# Patient Record
Sex: Female | Born: 1935 | Race: White | Hispanic: No | Marital: Single | Smoking: Never smoker
Health system: Southern US, Community
[De-identification: ages and names within clinical notes are randomized; demographics above are authoritative.]

## PROBLEM LIST (undated history)

## (undated) DIAGNOSIS — E119 Type 2 diabetes mellitus without complications: Secondary | ICD-10-CM

## (undated) DIAGNOSIS — G309 Alzheimer's disease, unspecified: Secondary | ICD-10-CM

## (undated) DIAGNOSIS — M8580 Other specified disorders of bone density and structure, unspecified site: Secondary | ICD-10-CM

## (undated) DIAGNOSIS — R9389 Abnormal findings on diagnostic imaging of other specified body structures: Secondary | ICD-10-CM

## (undated) DIAGNOSIS — E785 Hyperlipidemia, unspecified: Secondary | ICD-10-CM

## (undated) DIAGNOSIS — C44721 Squamous cell carcinoma of skin of unspecified lower limb, including hip: Secondary | ICD-10-CM

## (undated) DIAGNOSIS — F32A Depression, unspecified: Secondary | ICD-10-CM

## (undated) DIAGNOSIS — K219 Gastro-esophageal reflux disease without esophagitis: Secondary | ICD-10-CM

## (undated) DIAGNOSIS — R413 Other amnesia: Secondary | ICD-10-CM

## (undated) DIAGNOSIS — I499 Cardiac arrhythmia, unspecified: Secondary | ICD-10-CM

## (undated) DIAGNOSIS — F0280 Dementia in other diseases classified elsewhere without behavioral disturbance: Secondary | ICD-10-CM

## (undated) DIAGNOSIS — F329 Major depressive disorder, single episode, unspecified: Secondary | ICD-10-CM

## (undated) HISTORY — DX: Other specified disorders of bone density and structure, unspecified site: M85.80

## (undated) HISTORY — DX: Major depressive disorder, single episode, unspecified: F32.9

## (undated) HISTORY — DX: Hyperlipidemia, unspecified: E78.5

## (undated) HISTORY — DX: Depression, unspecified: F32.A

## (undated) HISTORY — DX: Other amnesia: R41.3

## (undated) HISTORY — PX: TOTAL HIP ARTHROPLASTY: SHX124

## (undated) HISTORY — DX: Type 2 diabetes mellitus without complications: E11.9

## (undated) HISTORY — DX: Abnormal findings on diagnostic imaging of other specified body structures: R93.89

---

## 1970-12-19 HISTORY — PX: VAGINAL HYSTERECTOMY: SUR661

## 1997-09-18 HISTORY — PX: OTHER SURGICAL HISTORY: SHX169

## 1999-11-12 ENCOUNTER — Encounter: Admission: RE | Admit: 1999-11-12 | Discharge: 1999-11-12 | Payer: Self-pay | Admitting: Family Medicine

## 1999-11-12 ENCOUNTER — Encounter: Payer: Self-pay | Admitting: Family Medicine

## 2000-10-19 HISTORY — PX: OTHER SURGICAL HISTORY: SHX169

## 2000-11-14 ENCOUNTER — Encounter: Admission: RE | Admit: 2000-11-14 | Discharge: 2000-11-14 | Payer: Self-pay | Admitting: Family Medicine

## 2000-11-14 ENCOUNTER — Encounter: Payer: Self-pay | Admitting: Family Medicine

## 2001-02-16 HISTORY — PX: BLADDER SUSPENSION: SHX72

## 2001-03-05 ENCOUNTER — Encounter: Payer: Self-pay | Admitting: Urology

## 2001-03-09 ENCOUNTER — Ambulatory Visit (HOSPITAL_COMMUNITY): Admission: RE | Admit: 2001-03-09 | Discharge: 2001-03-09 | Payer: Self-pay | Admitting: Urology

## 2001-10-19 HISTORY — PX: SQUAMOUS CELL CARCINOMA EXCISION: SHX2433

## 2001-10-19 LAB — HM DEXA SCAN

## 2001-11-16 ENCOUNTER — Encounter: Admission: RE | Admit: 2001-11-16 | Discharge: 2001-11-16 | Payer: Self-pay | Admitting: Family Medicine

## 2001-11-16 ENCOUNTER — Encounter: Payer: Self-pay | Admitting: Family Medicine

## 2002-04-04 ENCOUNTER — Encounter: Payer: Self-pay | Admitting: Emergency Medicine

## 2002-04-05 ENCOUNTER — Inpatient Hospital Stay (HOSPITAL_COMMUNITY): Admission: EM | Admit: 2002-04-05 | Discharge: 2002-04-09 | Payer: Self-pay | Admitting: Emergency Medicine

## 2002-04-26 ENCOUNTER — Encounter: Admission: RE | Admit: 2002-04-26 | Discharge: 2002-04-26 | Payer: Self-pay | Admitting: Specialist

## 2002-04-26 ENCOUNTER — Encounter: Payer: Self-pay | Admitting: Specialist

## 2002-07-19 HISTORY — PX: PELVIC FRACTURE SURGERY: SHX119

## 2002-11-04 ENCOUNTER — Encounter: Admission: RE | Admit: 2002-11-04 | Discharge: 2002-11-04 | Payer: Self-pay | Admitting: Family Medicine

## 2002-11-04 ENCOUNTER — Encounter: Payer: Self-pay | Admitting: Family Medicine

## 2003-03-05 ENCOUNTER — Other Ambulatory Visit: Admission: RE | Admit: 2003-03-05 | Discharge: 2003-03-05 | Payer: Self-pay | Admitting: Family Medicine

## 2003-03-05 ENCOUNTER — Encounter: Payer: Self-pay | Admitting: Family Medicine

## 2003-04-18 ENCOUNTER — Encounter: Payer: Self-pay | Admitting: Urology

## 2003-04-18 ENCOUNTER — Encounter: Admission: RE | Admit: 2003-04-18 | Discharge: 2003-04-18 | Payer: Self-pay | Admitting: Urology

## 2003-04-22 ENCOUNTER — Ambulatory Visit (HOSPITAL_BASED_OUTPATIENT_CLINIC_OR_DEPARTMENT_OTHER): Admission: RE | Admit: 2003-04-22 | Discharge: 2003-04-22 | Payer: Self-pay | Admitting: Urology

## 2003-05-07 ENCOUNTER — Encounter: Payer: Self-pay | Admitting: Urology

## 2003-05-07 ENCOUNTER — Encounter: Admission: RE | Admit: 2003-05-07 | Discharge: 2003-05-07 | Payer: Self-pay | Admitting: Urology

## 2003-11-18 ENCOUNTER — Encounter: Admission: RE | Admit: 2003-11-18 | Discharge: 2003-11-18 | Payer: Self-pay | Admitting: Family Medicine

## 2004-10-14 ENCOUNTER — Ambulatory Visit: Payer: Self-pay | Admitting: Family Medicine

## 2004-10-20 ENCOUNTER — Ambulatory Visit: Payer: Self-pay | Admitting: Family Medicine

## 2004-11-02 ENCOUNTER — Encounter: Admission: RE | Admit: 2004-11-02 | Discharge: 2004-11-24 | Payer: Self-pay | Admitting: Family Medicine

## 2004-12-01 ENCOUNTER — Ambulatory Visit: Payer: Self-pay | Admitting: Family Medicine

## 2005-01-13 ENCOUNTER — Encounter: Admission: RE | Admit: 2005-01-13 | Discharge: 2005-01-13 | Payer: Self-pay | Admitting: Family Medicine

## 2005-02-09 ENCOUNTER — Ambulatory Visit: Payer: Self-pay | Admitting: Family Medicine

## 2005-02-16 ENCOUNTER — Ambulatory Visit: Payer: Self-pay | Admitting: Family Medicine

## 2005-02-17 ENCOUNTER — Encounter: Admission: RE | Admit: 2005-02-17 | Discharge: 2005-05-18 | Payer: Self-pay | Admitting: Family Medicine

## 2005-02-23 ENCOUNTER — Inpatient Hospital Stay (HOSPITAL_COMMUNITY): Admission: RE | Admit: 2005-02-23 | Discharge: 2005-02-26 | Payer: Self-pay | Admitting: Specialist

## 2005-02-26 ENCOUNTER — Ambulatory Visit: Payer: Self-pay | Admitting: Physical Medicine & Rehabilitation

## 2005-02-26 ENCOUNTER — Inpatient Hospital Stay
Admission: RE | Admit: 2005-02-26 | Discharge: 2005-03-04 | Payer: Self-pay | Admitting: Physical Medicine & Rehabilitation

## 2005-03-30 ENCOUNTER — Ambulatory Visit: Payer: Self-pay | Admitting: Family Medicine

## 2005-06-30 ENCOUNTER — Ambulatory Visit: Payer: Self-pay | Admitting: Family Medicine

## 2005-10-17 ENCOUNTER — Ambulatory Visit: Payer: Self-pay | Admitting: Family Medicine

## 2006-02-23 ENCOUNTER — Encounter: Admission: RE | Admit: 2006-02-23 | Discharge: 2006-02-23 | Payer: Self-pay | Admitting: Family Medicine

## 2006-11-14 ENCOUNTER — Ambulatory Visit: Payer: Self-pay | Admitting: Family Medicine

## 2006-11-14 LAB — CONVERTED CEMR LAB: Hgb A1c MFr Bld: 6.6 %

## 2006-12-01 ENCOUNTER — Ambulatory Visit: Payer: Self-pay | Admitting: Family Medicine

## 2007-04-20 ENCOUNTER — Encounter: Admission: RE | Admit: 2007-04-20 | Discharge: 2007-04-20 | Payer: Self-pay | Admitting: Family Medicine

## 2007-07-13 ENCOUNTER — Encounter: Payer: Self-pay | Admitting: Family Medicine

## 2007-07-13 DIAGNOSIS — N3941 Urge incontinence: Secondary | ICD-10-CM

## 2007-07-13 DIAGNOSIS — E785 Hyperlipidemia, unspecified: Secondary | ICD-10-CM | POA: Insufficient documentation

## 2007-07-13 DIAGNOSIS — N318 Other neuromuscular dysfunction of bladder: Secondary | ICD-10-CM | POA: Insufficient documentation

## 2007-07-13 DIAGNOSIS — M1990 Unspecified osteoarthritis, unspecified site: Secondary | ICD-10-CM | POA: Insufficient documentation

## 2007-07-13 DIAGNOSIS — E119 Type 2 diabetes mellitus without complications: Secondary | ICD-10-CM

## 2007-07-16 ENCOUNTER — Encounter: Payer: Self-pay | Admitting: Family Medicine

## 2007-07-16 ENCOUNTER — Ambulatory Visit: Payer: Self-pay | Admitting: Family Medicine

## 2007-07-16 DIAGNOSIS — K219 Gastro-esophageal reflux disease without esophagitis: Secondary | ICD-10-CM | POA: Insufficient documentation

## 2007-07-31 ENCOUNTER — Ambulatory Visit: Payer: Self-pay | Admitting: Family Medicine

## 2007-08-14 ENCOUNTER — Ambulatory Visit: Payer: Self-pay | Admitting: Family Medicine

## 2007-08-16 ENCOUNTER — Encounter: Payer: Self-pay | Admitting: Family Medicine

## 2007-08-23 ENCOUNTER — Ambulatory Visit: Payer: Self-pay | Admitting: Family Medicine

## 2007-09-19 DIAGNOSIS — R9389 Abnormal findings on diagnostic imaging of other specified body structures: Secondary | ICD-10-CM

## 2007-09-19 HISTORY — DX: Abnormal findings on diagnostic imaging of other specified body structures: R93.89

## 2007-09-24 ENCOUNTER — Ambulatory Visit: Payer: Self-pay | Admitting: Family Medicine

## 2007-09-24 DIAGNOSIS — M810 Age-related osteoporosis without current pathological fracture: Secondary | ICD-10-CM

## 2007-09-25 ENCOUNTER — Telehealth: Payer: Self-pay | Admitting: Family Medicine

## 2007-10-01 ENCOUNTER — Ambulatory Visit: Payer: Self-pay | Admitting: Internal Medicine

## 2007-12-24 ENCOUNTER — Ambulatory Visit: Payer: Self-pay | Admitting: Family Medicine

## 2007-12-28 LAB — CONVERTED CEMR LAB
ALT: 17 units/L (ref 0–35)
AST: 25 units/L (ref 0–37)
BUN: 21 mg/dL (ref 6–23)
Calcium: 9.8 mg/dL (ref 8.4–10.5)
Chloride: 107 meq/L (ref 96–112)
GFR calc non Af Amer: 73 mL/min
LDL Cholesterol: 84 mg/dL (ref 0–99)
Sodium: 139 meq/L (ref 135–145)
VLDL: 18 mg/dL (ref 0–40)

## 2008-02-01 ENCOUNTER — Encounter: Payer: Self-pay | Admitting: Family Medicine

## 2008-02-08 ENCOUNTER — Encounter (INDEPENDENT_AMBULATORY_CARE_PROVIDER_SITE_OTHER): Payer: Self-pay | Admitting: *Deleted

## 2008-02-17 DIAGNOSIS — R413 Other amnesia: Secondary | ICD-10-CM

## 2008-02-17 HISTORY — DX: Other amnesia: R41.3

## 2008-03-14 ENCOUNTER — Ambulatory Visit: Payer: Self-pay | Admitting: Family Medicine

## 2008-03-14 DIAGNOSIS — R413 Other amnesia: Secondary | ICD-10-CM | POA: Insufficient documentation

## 2008-03-19 LAB — CONVERTED CEMR LAB
Basophils Absolute: 0 10*3/uL (ref 0.0–0.1)
Basophils Relative: 0.5 % (ref 0.0–1.0)
Monocytes Absolute: 0.7 10*3/uL (ref 0.1–1.0)
Neutro Abs: 6 10*3/uL (ref 1.4–7.7)
Platelets: 231 10*3/uL (ref 150–400)
Vitamin B-12: 678 pg/mL (ref 211–911)
WBC: 9.1 10*3/uL (ref 4.5–10.5)

## 2008-03-22 ENCOUNTER — Ambulatory Visit: Payer: Self-pay | Admitting: Family Medicine

## 2008-04-02 ENCOUNTER — Telehealth: Payer: Self-pay | Admitting: Family Medicine

## 2008-05-07 ENCOUNTER — Encounter: Admission: RE | Admit: 2008-05-07 | Discharge: 2008-05-07 | Payer: Self-pay | Admitting: Family Medicine

## 2008-05-09 ENCOUNTER — Encounter (INDEPENDENT_AMBULATORY_CARE_PROVIDER_SITE_OTHER): Payer: Self-pay | Admitting: *Deleted

## 2008-06-23 ENCOUNTER — Ambulatory Visit: Payer: Self-pay | Admitting: Family Medicine

## 2008-06-24 LAB — CONVERTED CEMR LAB
ALT: 18 units/L (ref 0–35)
Bilirubin, Direct: 0.1 mg/dL (ref 0.0–0.3)
Calcium: 9.4 mg/dL (ref 8.4–10.5)
Cholesterol: 129 mg/dL (ref 0–200)
GFR calc Af Amer: 77 mL/min
Glucose, Bld: 78 mg/dL (ref 70–99)
HDL: 48.1 mg/dL (ref >39.0)
Hgb A1c MFr Bld: 6.8 % — ABNORMAL HIGH (ref 4.6–6.0)
LDL Cholesterol: 65 mg/dL (ref 0–99)
Phosphorus: 3.2 mg/dL (ref 2.3–4.6)
Potassium: 4.5 meq/L (ref 3.5–5.1)
Sodium: 141 meq/L (ref 135–145)
Total Bilirubin: 0.7 mg/dL (ref 0.3–1.2)
Total Protein: 6.3 g/dL (ref 6.0–8.3)
VLDL: 16 mg/dL (ref 0–40)

## 2008-09-05 ENCOUNTER — Telehealth (INDEPENDENT_AMBULATORY_CARE_PROVIDER_SITE_OTHER): Payer: Self-pay | Admitting: *Deleted

## 2008-09-18 ENCOUNTER — Telehealth: Payer: Self-pay | Admitting: Family Medicine

## 2008-09-18 ENCOUNTER — Encounter: Payer: Self-pay | Admitting: Family Medicine

## 2008-11-06 ENCOUNTER — Encounter: Payer: Self-pay | Admitting: Family Medicine

## 2008-12-19 HISTORY — PX: CATARACT EXTRACTION, BILATERAL: SHX1313

## 2008-12-22 ENCOUNTER — Ambulatory Visit: Payer: Self-pay | Admitting: Family Medicine

## 2008-12-24 LAB — CONVERTED CEMR LAB
AST: 26 units/L (ref 0–37)
Albumin: 3.6 g/dL (ref 3.5–5.2)
Chloride: 106 meq/L (ref 96–112)
Creatinine, Ser: 1 mg/dL (ref 0.4–1.2)
Creatinine,U: 243.4 mg/dL
Hgb A1c MFr Bld: 6.8 % — ABNORMAL HIGH (ref 4.6–6.0)
LDL Cholesterol: 84 mg/dL (ref 0–99)
Microalb Creat Ratio: 2.9 mg/g (ref 0.0–30.0)
Microalb, Ur: 0.7 mg/dL (ref 0.0–1.9)
Potassium: 4.4 meq/L (ref 3.5–5.1)
Sodium: 142 meq/L (ref 135–145)
VLDL: 17 mg/dL (ref 0–40)

## 2008-12-25 ENCOUNTER — Ambulatory Visit: Payer: Self-pay | Admitting: Family Medicine

## 2008-12-25 ENCOUNTER — Telehealth: Payer: Self-pay | Admitting: Family Medicine

## 2009-05-06 ENCOUNTER — Encounter: Payer: Self-pay | Admitting: Family Medicine

## 2009-05-08 ENCOUNTER — Encounter: Admission: RE | Admit: 2009-05-08 | Discharge: 2009-05-08 | Payer: Self-pay | Admitting: Family Medicine

## 2009-05-12 ENCOUNTER — Encounter (INDEPENDENT_AMBULATORY_CARE_PROVIDER_SITE_OTHER): Payer: Self-pay | Admitting: *Deleted

## 2009-06-23 ENCOUNTER — Ambulatory Visit: Payer: Self-pay | Admitting: Family Medicine

## 2009-06-24 LAB — CONVERTED CEMR LAB
ALT: 13 units/L (ref 0–35)
AST: 25 units/L (ref 0–37)
Chloride: 105 meq/L (ref 96–112)
Creatinine, Ser: 0.9 mg/dL (ref 0.4–1.2)
Glucose, Bld: 102 mg/dL — ABNORMAL HIGH (ref 70–99)
Hgb A1c MFr Bld: 6.7 % — ABNORMAL HIGH (ref 4.6–6.5)
Phosphorus: 3.8 mg/dL (ref 2.3–4.6)
Potassium: 4.2 meq/L (ref 3.5–5.1)
Sodium: 142 meq/L (ref 135–145)
VLDL: 17.4 mg/dL (ref 0.0–40.0)

## 2009-06-26 ENCOUNTER — Ambulatory Visit: Payer: Self-pay | Admitting: Family Medicine

## 2009-09-23 ENCOUNTER — Encounter: Payer: Self-pay | Admitting: Family Medicine

## 2009-09-24 ENCOUNTER — Ambulatory Visit: Payer: Self-pay | Admitting: Family Medicine

## 2009-10-15 ENCOUNTER — Encounter: Payer: Self-pay | Admitting: Family Medicine

## 2009-10-15 ENCOUNTER — Telehealth: Payer: Self-pay | Admitting: Family Medicine

## 2009-12-22 ENCOUNTER — Ambulatory Visit: Payer: Self-pay | Admitting: Family Medicine

## 2009-12-22 DIAGNOSIS — I10 Essential (primary) hypertension: Secondary | ICD-10-CM | POA: Insufficient documentation

## 2009-12-23 LAB — CONVERTED CEMR LAB
ALT: 16 units/L (ref 0–35)
AST: 24 units/L (ref 0–37)
Albumin: 3.5 g/dL (ref 3.5–5.2)
BUN: 12 mg/dL (ref 6–23)
Chloride: 104 meq/L (ref 96–112)
Cholesterol: 165 mg/dL (ref 0–200)
Glucose, Bld: 107 mg/dL — ABNORMAL HIGH (ref 70–99)
Phosphorus: 4.2 mg/dL (ref 2.3–4.6)
Potassium: 4.8 meq/L (ref 3.5–5.1)
Sodium: 141 meq/L (ref 135–145)

## 2010-01-07 ENCOUNTER — Ambulatory Visit: Payer: Self-pay | Admitting: Family Medicine

## 2010-01-08 ENCOUNTER — Telehealth: Payer: Self-pay | Admitting: Family Medicine

## 2010-02-16 ENCOUNTER — Ambulatory Visit: Payer: Self-pay | Admitting: Family Medicine

## 2010-02-17 LAB — CONVERTED CEMR LAB
ALT: 13 units/L (ref 0–35)
AST: 19 units/L (ref 0–37)
HDL: 57.7 mg/dL (ref >39.00)

## 2010-03-09 ENCOUNTER — Encounter: Payer: Self-pay | Admitting: Family Medicine

## 2010-04-05 ENCOUNTER — Ambulatory Visit: Payer: Self-pay | Admitting: Internal Medicine

## 2010-05-10 ENCOUNTER — Encounter: Admission: RE | Admit: 2010-05-10 | Discharge: 2010-05-10 | Payer: Self-pay | Admitting: Family Medicine

## 2010-05-14 ENCOUNTER — Encounter (INDEPENDENT_AMBULATORY_CARE_PROVIDER_SITE_OTHER): Payer: Self-pay | Admitting: *Deleted

## 2010-07-08 ENCOUNTER — Ambulatory Visit: Payer: Self-pay | Admitting: Family Medicine

## 2010-07-08 LAB — CONVERTED CEMR LAB
ALT: 14 units/L (ref 0–35)
Cholesterol: 145 mg/dL (ref 0–200)
Creatinine,U: 257.9 mg/dL
GFR calc non Af Amer: 66.52 mL/min (ref >60)
HDL: 54.1 mg/dL (ref >39.00)
Microalb Creat Ratio: 0.6 mg/g (ref 0.0–30.0)
Phosphorus: 3.8 mg/dL (ref 2.3–4.6)
Potassium: 4.5 meq/L (ref 3.5–5.1)
VLDL: 16 mg/dL (ref 0.0–40.0)

## 2010-07-09 LAB — CONVERTED CEMR LAB: Vit D, 25-Hydroxy: 47 ng/mL (ref 30–89)

## 2010-07-12 ENCOUNTER — Ambulatory Visit: Payer: Self-pay | Admitting: Family Medicine

## 2010-09-16 ENCOUNTER — Ambulatory Visit: Payer: Self-pay | Admitting: Family Medicine

## 2010-10-12 ENCOUNTER — Ambulatory Visit: Payer: Self-pay | Admitting: Family Medicine

## 2010-10-12 LAB — CONVERTED CEMR LAB
CO2: 22 meq/L (ref 19–32)
Calcium: 8.6 mg/dL (ref 8.4–10.5)
Chloride: 108 meq/L (ref 96–112)
GFR calc non Af Amer: 61.5 mL/min (ref >60)
Potassium: 4.2 meq/L (ref 3.5–5.1)
Sodium: 139 meq/L (ref 135–145)

## 2010-10-14 ENCOUNTER — Encounter: Payer: Self-pay | Admitting: Family Medicine

## 2010-10-19 ENCOUNTER — Ambulatory Visit: Payer: Self-pay | Admitting: Family Medicine

## 2010-11-16 ENCOUNTER — Telehealth: Payer: Self-pay | Admitting: Family Medicine

## 2010-11-18 ENCOUNTER — Encounter: Payer: Self-pay | Admitting: Family Medicine

## 2010-12-23 ENCOUNTER — Encounter: Payer: Self-pay | Admitting: Family Medicine

## 2010-12-30 ENCOUNTER — Encounter (INDEPENDENT_AMBULATORY_CARE_PROVIDER_SITE_OTHER): Payer: Self-pay | Admitting: *Deleted

## 2011-01-20 ENCOUNTER — Other Ambulatory Visit (INDEPENDENT_AMBULATORY_CARE_PROVIDER_SITE_OTHER): Payer: Medicare Other

## 2011-01-20 ENCOUNTER — Ambulatory Visit: Admit: 2011-01-20 | Payer: Self-pay | Admitting: Family Medicine

## 2011-01-20 ENCOUNTER — Other Ambulatory Visit: Payer: Self-pay | Admitting: Family Medicine

## 2011-01-20 ENCOUNTER — Encounter (INDEPENDENT_AMBULATORY_CARE_PROVIDER_SITE_OTHER): Payer: Self-pay | Admitting: *Deleted

## 2011-01-20 DIAGNOSIS — E785 Hyperlipidemia, unspecified: Secondary | ICD-10-CM

## 2011-01-20 DIAGNOSIS — E119 Type 2 diabetes mellitus without complications: Secondary | ICD-10-CM

## 2011-01-20 DIAGNOSIS — I10 Essential (primary) hypertension: Secondary | ICD-10-CM

## 2011-01-20 LAB — LIPID PANEL
HDL: 52.1 mg/dL (ref >39.00)
LDL Cholesterol: 78 mg/dL (ref 0–99)
Total CHOL/HDL Ratio: 3
Triglycerides: 72 mg/dL (ref 0.0–149.0)

## 2011-01-20 LAB — RENAL FUNCTION PANEL
Albumin: 3.7 g/dL (ref 3.5–5.2)
CO2: 25 mEq/L (ref 19–32)
Calcium: 9 mg/dL (ref 8.4–10.5)
Chloride: 101 mEq/L (ref 96–112)
Phosphorus: 3.8 mg/dL (ref 2.3–4.6)
Potassium: 4.3 mEq/L (ref 3.5–5.1)

## 2011-01-20 LAB — HEMOGLOBIN A1C: Hgb A1c MFr Bld: 6.8 % — ABNORMAL HIGH (ref 4.6–6.5)

## 2011-01-20 LAB — ALT: ALT: 11 U/L (ref 0–35)

## 2011-01-20 LAB — AST: AST: 18 U/L (ref 0–37)

## 2011-01-20 NOTE — Assessment & Plan Note (Signed)
Summary: flu shot/dlo   Nurse Visit   Allergies: 1)  ! Fosamax 2)  ! Actonel  Immunizations Administered:  Influenza Vaccine # 1:    Vaccine Type: Fluvax MCR    Site: left deltoid    Mfr: GlaxoSmithKline    Dose: 0.5 ml    Route: IM    Given by: Mervin Hack CMA (AAMA)    Exp. Date: 06/18/2011    Lot #: [id number]    VIS given: 07/13/10 version given September 17, 2010.  Flu Vaccine Consent Questions:    Do you have a history of severe allergic reactions to this vaccine? no    Any prior history of allergic reactions to egg and/or gelatin? no    Do you have a sensitivity to the preservative Thimersol? no    Do you have a past history of Guillan-Barre Syndrome? no    Do you currently have an acute febrile illness? no    Have you ever had a severe reaction to latex? no    Vaccine information given and explained to patient? yes    Are you currently pregnant? no  Orders Added: 1)  Flu Vaccine 60yrs + MEDICARE PATIENTS [Q2039] 2)  Administration Flu vaccine - MCR [G0008]

## 2011-01-20 NOTE — Miscellaneous (Signed)
Summary: med list update- test strips,lancets  Medications Added ONETOUCH ULTRA BLUE  STRP (GLUCOSE BLOOD) check blood sugar once a day and as directed LANCETS  MISC (LANCETS) check blood sugar once a day and as directed       Clinical Lists Changes  Medications: Changed medication from * GLUCOSE TEST STRIPS AND LANCETS to check sugar once daily and as needed for DM 250.0 to ONETOUCH ULTRA BLUE  STRP (GLUCOSE BLOOD) check blood sugar once a day and as directed Added new medication of LANCETS  MISC (LANCETS) check blood sugar once a day and as directed     Prior Medications: DETROL LA 4 MG  CP24 (TOLTERODINE TARTRATE) one by mouth daily LIPITOR 10 MG  TABS (ATORVASTATIN CALCIUM) 1 by mouth once daily in evening ECOTRIN 325 MG  TBEC (ASPIRIN) take one by mouth daily MIACALCIN 200 UNIT/ACT NASAL SOLN (CALCITONIN (SALMON)) 1 spray in one nostril once daily (alternate nostrils) ARICEPT 10 MG  TABS (DONEPEZIL HCL) take 1 tablet by mouth once daily PROTONIX 40 MG  TBEC (PANTOPRAZOLE SODIUM) 1 by mouth once daily in am CALTRATE 600+D 600-400 MG-UNIT TABS (CALCIUM CARBONATE-VITAMIN D) take 1 by mouth two times a day METFORMIN HCL 500 MG TABS (METFORMIN HCL) 1 by mouth two times a day GLUCOMETER () to check sugar once daily and as needed for DM 250.0 ONETOUCH ULTRA BLUE  STRP (GLUCOSE BLOOD) check blood sugar once a day and as directed LANCETS  MISC (LANCETS) check blood sugar once a day and as directed Current Allergies: ! FOSAMAX ! ACTONEL

## 2011-01-20 NOTE — Progress Notes (Signed)
Summary: prior Berkley Harvey is needed for pantoprazole  Phone Note From Pharmacy   Caller: [facility] Summary of Call: Prior Berkley Harvey is needed for pantoprazole, form is on your shelf.  This is a renewal. Initial call taken by: Lowella Petties CMA, AAMA,  November 16, 2010 11:25 AM  Follow-up for Phone Call        form done and in nurse in box   Follow-up by: Judith Part MD,  November 17, 2010 1:29 PM  Additional Follow-up for Phone Call Additional follow up Details #1::        Form faxed.              Lowella Petties CMA, AAMA  November 18, 2010 11:51 AM  Prior Berkley Harvey given for pantoprazole.  Advised pharmacy, approval letter placed on doctors shelf for signature and scanning. Additional Follow-up by: Lowella Petties CMA, AAMA,  November 22, 2010 8:55 AM

## 2011-01-20 NOTE — Letter (Signed)
Summary: Results Follow up Letter  [place] [facility]  [address] [place], [place] [address]   Phone: [phone]  Fax: [phone]    05/14/2010 MRN: [id number]    EVELENE ROUSSIN [address] [place], [place]  [address]    Dear Ms. Moodie,  The following are the results of your recent test(s):  Test         Result    Pap Smear:        Normal _____  Not Normal _____ Comments: ______________________________________________________ Cholesterol: LDL(Bad cholesterol):         Your goal is less than:         HDL (Good cholesterol):       Your goal is more than: Comments:  ______________________________________________________ Mammogram:        Normal __X___  Not Normal _____ Comments:  Yearly follow up is recommended.   ___________________________________________________________________ Hemoccult:        Normal _____  Not normal _______ Comments:    _____________________________________________________________________ Other Tests:    We routinely do not discuss normal results over the telephone.  If you desire a copy of the results, or you have any questions about this information we can discuss them at your next office visit.   Sincerely,    Marne A. Milinda Antis, M.D.  MAT:lsf

## 2011-01-20 NOTE — Assessment & Plan Note (Signed)
Summary: 6 MO. F/U/BIR   Vital Signs:  Patient profile:   77 year old female Weight:      134 pounds Temp:     97.3 degrees F oral Pulse rate:   68 / minute Pulse rhythm:   regular BP sitting:   130 / 60  (left arm) Cuff size:   regular  Vitals Entered By: Lowella Petties CMA (January 07, 2010 11:44 AM) CC: 6 month follow up   History of Present Illness: here for follow up of DM and HTN and lipids  is doing ok - nothing new  feeling ok   wt is up 3 lb is eating a fairly healthy diet -- mostly eats in  not much walking - is limited with a cane   DM- AIC is 6.8- fairly stable  is good watching sugar in diet  opthy is up to date  had both cataracts removed- went very well -- vision is really good -- last exam dec / goes to [facility] opthy Dr Burgess Estelle did surgery   lipids are well controlled with trig 98/ HDL 54 and LDL of 91 LDL is not to goal of 70-- pt is open to inc dose   seeing neurol for mild cognitive impairment she really struggles with that -- is about the same/ short term mem loss is frustrating , however  still does her own finances / sister helps her with that  sees [name]- will f/u in 10 mo  has osteopenia on miacalcin nasal spray  last dexa 2/09-- wants to go ahead and set up in spring  on ca and D  bp is good 130/60- no problems or changes with that   constant post nasal drip -- ? if antihist would help     Allergies: 1)  ! Fosamax 2)  ! Actonel  Past History:  Past Medical History: Last updated: 12/25/2008 Diabetes mellitus, type II Hyperlipidemia CT chest, 5mm L nodule- needs f/u CT in one year from 10/08 short term memory loss 3/09- (mild cognitive impairment)   neurology--Dr Love opthy- Dr Nelle Don  Family History: Last updated: 12/25/2008 Father: CVA Mother: CVA, dementia Siblings: 5 sisters- 2 breast cancer, 2 brothers- 1 prostate cancer older sister with alzheimers  sister- mild memory loss  sister with breast cancer (and  neices)  Social History: Last updated: 12/25/2008 Marital Status: single lives with her sister- who helps care for her (and another sister) Children: none Occupation: retired Engineer, site involved in church activities   Risk Factors: Smoking Status: never (07/13/2007)  Past Surgical History: 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 Eyes:  Denies blurring and eye irritation. CV:  Denies chest pain or discomfort, lightheadness, palpitations, and shortness of breath with exertion. Resp:  Denies cough and wheezing. GI:  Denies abdominal pain, change in bowel habits, and indigestion. MS:  Complains of joint pain and stiffness; denies muscle aches. Derm:  Denies itching, lesion(s), poor wound healing, and rash. Neuro:  Denies numbness and tingling. Psych:  Denies anxiety and depression. Endo:  Denies cold intolerance, excessive thirst, excessive urination, and heat intolerance. Heme:  Denies abnormal bruising and bleeding.  Physical Exam  General:  elderly frail app female in no distress- breathing easily Head:  normocephalic, atraumatic, and no abnormalities observed.   Eyes:  vision grossly intact, pupils equal, pupils round, and pupils reactive to light.   Neck:  supple with full rom and no masses or thyromegally, no JVD or carotid bruit  Lungs:  Normal respiratory effort, chest expands symmetrically. Lungs are clear to auscultation, no crackles or wheezes. Heart:  Normal rate and regular rhythm. S1 and S2 normal without gallop,  murmur, click, rub or other extra sounds. Abdomen:  soft and non-tender.   Msk:  mild kyphosis  OA changes in periph joints Pulses:  R and L carotid,radial,femoral,dorsalis pedis and posterior tibial pulses are full and equal bilaterally Neurologic:  sensation intact to light touch, gait normal, and DTRs symmetrical and normal.   Skin:  overgrown toenail L 2nd toe- is impinging on the 1st toe  thickened nails  no rash  Cervical Nodes:  No lymphadenopathy noted Inguinal Nodes:  No significant adenopathy Psych:  normal affect, talkative and pleasant   Diabetes Management Exam:    Foot Exam (with socks and/or shoes not present):       Sensory-Pinprick/Light touch:          Left medial foot (L-4): normal          Left dorsal foot (L-5): normal          Left lateral foot (S-1): normal          Right medial foot (L-4): normal          Right dorsal foot (L-5): normal          Right lateral foot (S-1): normal       Sensory-Monofilament:          Left foot: normal          Right foot: normal       Inspection:          Left foot: normal          Right foot: normal       Nails:          Left foot: thickened          Right foot: thickened    Eye Exam:       Eye Exam done elsewhere          Date: 11/18/2009          Results: normal          Done by: Manley Mason opthy   Impression & Recommendations:  Problem # 1:  ESSENTIAL HYPERTENSION, BENIGN (ICD-401.1) Assessment Unchanged control is good currently - no changes  rev low sodium in diet  enc to be more active  BP today: 130/60 Prior BP: 130/60 (06/26/2009)  Labs Reviewed: K+: 4.8 (12/22/2009) Creat: : 0.8 (12/22/2009)   Chol: 165 (12/22/2009)   HDL: 54.60 (12/22/2009)   LDL: 91 (12/22/2009)   TG: 98.0 (12/22/2009)  Problem # 2:  OSTEOPOROSIS NOS (ICD-733.00) Assessment: Unchanged due for dexa in spring consider trial of evista enc to continue miacalcin and ca and D  will check D level with lab in 6 mo  Her updated medication  list for this problem includes:    Miacalcin 200 Unit/act Nasal Soln (Calcitonin (salmon)) .Marland Kitchen... 1 spray in one nostril once daily (alternate nostrils)  Orders: Radiology Referral (Radiology)  Problem # 3:  MEMORY LOSS (ICD-780.93) Assessment: Comment Only overall stable mild cog  impairment- followed by [name] wil continue the aricept  Problem # 4:  HYPERLIPIDEMIA (ICD-272.4) Assessment: Unchanged  lipids not quite at goal- will inc to 10 mg daily lipitor rev low sat fat diet  labs in 6 wk and update f/u 6 mo  Her updated medication list for this problem includes:    Lipitor 10 Mg Tabs (Atorvastatin calcium) .Marland Kitchen... 1 by mouth once daily in evening  Orders: Prescription Created Electronically [phone])  Labs Reviewed: SGOT: 24 (12/22/2009)   SGPT: 16 (12/22/2009)   HDL:54.60 (12/22/2009), 52.30 (06/23/2009)  LDL:91 (12/22/2009), 84 (06/23/2009)  Chol:165 (12/22/2009), 154 (06/23/2009)  Trig:98.0 (12/22/2009), 87.0 (06/23/2009)  Problem # 5:  DIABETES MELLITUS, TYPE II (ICD-250.00) Assessment: Unchanged  overall stable with diet control  opthy utd  ref to pod for diff to cut nails  disc healthy diet (low simple sugar/ choose complex carbs/ low sat fat) diet and exercise in detail  lab and f/u 6 mo  Her updated medication list for this problem includes:    Ecotrin 325 Mg Tbec (Aspirin) .Marland Kitchen... Take one by mouth daily  Orders: Podiatry Referral (Podiatry)  Labs Reviewed: Creat: 0.8 (12/22/2009)     Last Eye Exam: normal (04/18/2009) Reviewed HgBA1c results: 6.8 (12/22/2009)  6.7 (06/23/2009)  Complete Medication List: 1)  Detrol La 4 Mg Cp24 (Tolterodine tartrate) .... One by mouth daily 2)  Lipitor 10 Mg Tabs (Atorvastatin calcium) .Marland Kitchen.. 1 by mouth once daily in evening 3)  Ecotrin 325 Mg Tbec (Aspirin) .... Take one by mouth daily 4)  Miacalcin 200 Unit/act Nasal Soln (Calcitonin (salmon)) .Marland Kitchen.. 1 spray in one nostril once daily (alternate nostrils) 5)  Caltrate 600+d  Plus 600-400 Mg-unit  .Marland KitchenMarland Kitchen. 1 by mouth two times a day 6)  Aricept 10 Mg Tabs (Donepezil hcl) .... Take 1 tablet by mouth once daily 7)  Protonix 40 Mg Tbec (Pantoprazole sodium) .Marland Kitchen.. 1 by mouth once daily in am  Patient Instructions: 1)  we will refer you to podiatry at check out  2)  keep working on healthy diet and exercise 3)  try claritin one daily for post nasal drip  4)  we will schedule bone density test at check out  5)  increase lipitor to one whole pill daily  6)  schedule fasting labs in 6 weeks lipid/ast/alt272 7)  then schedule fasting lab and f/u in 6 mo lipid/ast/alt/AIC/renal / microalb/ vit D level 272, 250.0, 401.1  Prescriptions: LIPITOR 10 MG  TABS (ATORVASTATIN CALCIUM) 1 by mouth once daily in evening  #30 x 11   Entered and Authorized by:   Judith Part MD   Signed by:   Judith Part MD on 01/07/2010   Method used:   Electronically to        [facility]* (retail)       [address]       [place], [place]  [address]       Ph: [phone]       Fax: [phone]   RxID:   [id number]   Prior Medications (reviewed today): DETROL LA 4 MG  CP24 (TOLTERODINE TARTRATE) one by mouth daily ECOTRIN 325 MG  TBEC (ASPIRIN) take one by mouth daily MIACALCIN 200 UNIT/ACT NASAL SOLN (CALCITONIN (SALMON)) 1 spray in one nostril once daily (alternate nostrils) CALTRATE 600+D PLUS 600-400 MG-UNIT () 1 by mouth two times a day ARICEPT 10 MG  TABS (DONEPEZIL HCL) take 1 tablet by mouth once daily PROTONIX 40 MG  TBEC (PANTOPRAZOLE SODIUM) 1 by  mouth once daily in am Current Allergies: ! FOSAMAX ! ACTONEL

## 2011-01-20 NOTE — Letter (Signed)
Summary: [facility]  [facility]   Imported By: Maryln Gottron 10/20/2010 13:48:43  _____________________________________________________________________  External Attachment:    Type:   Image     Comment:   External Document

## 2011-01-20 NOTE — Letter (Signed)
Summary: [facility]   Imported By: Lanelle Bal 01/03/2011 07:41:13  _____________________________________________________________________  External Attachment:    Type:   Image     Comment:   External Document  Appended Document: [facility]     Clinical Lists Changes  Observations: Added new observation of DMEYEEXAMNXT: 12/2011 (01/05/2011 21:16) Added new observation of DMEYEEXMRES: normal (12/23/2010 21:16) Added new observation of EYE EXAM BY: Dr Burgess Estelle (12/23/2010 21:16) Added new observation of DIAB EYE EX: normal (12/23/2010 21:16)        Diabetes Management Exam:    Eye Exam:       Eye Exam done elsewhere          Date: 12/23/2010          Results: normal          Done by: Dr Burgess Estelle

## 2011-01-20 NOTE — Medication Information (Signed)
Summary: Prior Authorization and Approval for Pantoprazole Sodium  Prior Authorization and Approval for Pantoprazole Sodium   Imported By: Maryln Gottron 11/29/2010 15:42:42  _____________________________________________________________________  External Attachment:    Type:   Image     Comment:   External Document

## 2011-01-20 NOTE — Assessment & Plan Note (Signed)
Summary: follow up   Vital Signs:  Patient profile:   77 year old female Height:      61 inches Weight:      135.25 pounds BMI:     25.65 Temp:     97.8 degrees F oral Pulse rate:   76 / minute Pulse rhythm:   regular BP sitting:   132 / 64  (left arm) Cuff size:   regular  Vitals Entered By: Lewanda Rife LPN (July 12, 2010 9:05 AM) CC: follow-up visit   History of Present Illness: here for f/u of DM and lipids and HTN and osteopenia  is feeling ok overall -nothing new   wt is not changed  bp is 132/64 on first check today  DM is slt worse with AIC 7.0 is eating about the same as usual -- perhaps portions could be smaller is close to a diabetic diet  not much exercise - due to hot weather  thinks she can do better  this is diet controlled  vit D level is good on current suppl at 47- fairly stable  lipids are well controlled with trig 80 and HDL 54 and LDL 75 (up from 66)  memory problems -- MCI -- short term memory is not good  she still does most everything incl writing checks  not affecting her activities   some post nasal drip  is on gerd reflux med      Allergies: 1)  ! Fosamax 2)  ! Actonel  Past History:  Past Medical History: Last updated: 03/29/2010 Diabetes mellitus, type II Hyperlipidemia CT chest, 5mm L nodule- needs f/u CT in one year from 10/08 short term memory loss 3/09- (mild cognitive impairment) osteopenia    neurology--Dr Love opthy- Dr Nelle Don  Past Surgical History: Last updated: 01/07/2010 Total hip replacement- right (02/2005) Hysterectomy Ischemic colitis- colonoscopy (09/1997) Dexa- osteopenia (10/2000)     improved (10/2001) Squamous cell carcinoma on leg (10/1999) Pelvic vaginal sling for incont (02/2001) Pelvic fracture after a fall (07/2002) Colonoscopy- diverticulosis (1998,  03/2003) CT lung nodule 10/08- re check 1 yr 2010- bilat cataracts removed   Family History: Last updated: 12/25/2008 Father:  CVA Mother: CVA, dementia Siblings: 5 sisters- 2 breast cancer, 2 brothers- 1 prostate cancer older sister with alzheimers  sister- mild memory loss  sister with breast cancer (and neices)  Social History: Last updated: 12/25/2008 Marital Status: single lives with her sister- who helps care for her (and another sister) Children: none Occupation: retired Engineer, site involved in church activities   Risk Factors: Smoking Status: never (07/13/2007)  Review of Systems General:  Denies fatigue, loss of appetite, and malaise. Eyes:  Denies blurring and eye irritation. ENT:  Complains of postnasal drainage; denies sinus pressure and sore throat; some allergy drainage - clears her throat and spits into throat . CV:  Denies chest pain or discomfort, palpitations, and shortness of breath with exertion. Resp:  Denies cough, shortness of breath, sputum productive, and wheezing. GI:  Denies abdominal pain, change in bowel habits, and nausea. MS:  Denies muscle aches, cramps, and muscle weakness. Derm:  Denies itching, poor wound healing, and rash. Neuro:  Denies numbness and tingling. Psych:  Denies anxiety and depression. Endo:  Denies cold intolerance, excessive thirst, excessive urination, and heat intolerance. Heme:  Denies abnormal bruising and bleeding.  Physical Exam  General:  elderly female/ overwt in no distress Head:  normocephalic, atraumatic, and no abnormalities observed.   Eyes:  vision grossly intact, pupils equal, pupils  round, and pupils reactive to light.  no conjunctival pallor, injection or icterus  Mouth:  pharynx pink and moist.   Neck:  supple with full rom and no masses or thyromegally, no JVD or carotid bruit  Chest Wall:  No deformities, masses, or tenderness noted. Lungs:  normal respiratory effort, no intercostal retractions, no accessory muscle use, normal breath sounds, no crackles, and no wheezes.   Heart:  Normal rate and regular rhythm. S1 and S2 normal  without gallop, murmur, click, rub or other extra sounds. Abdomen:  Bowel sounds positive,abdomen soft and non-tender without masses, organomegaly or hernias noted. no renal bruits  Msk:  No deformity or scoliosis noted of thoracic or lumbar spine.  some baseline kyphosis  Pulses:  R and L carotid,radial,femoral,dorsalis pedis and posterior tibial pulses are full and equal bilaterally Extremities:  No clubbing, cyanosis, edema, or deformity noted with normal full range of motion of all joints.   Neurologic:  sensation intact to light touch, gait normal, and DTRs symmetrical and normal.   Skin:  Intact without suspicious lesions or rashes Cervical Nodes:  No lymphadenopathy noted Inguinal Nodes:  No significant adenopathy Psych:  normal affect, talkative and pleasant   Diabetes Management Exam:    Foot Exam (with socks and/or shoes not present):       Sensory-Pinprick/Light touch:          Left medial foot (L-4): normal          Left dorsal foot (L-5): normal          Left lateral foot (S-1): normal          Right medial foot (L-4): normal          Right dorsal foot (L-5): normal          Right lateral foot (S-1): normal       Sensory-Monofilament:          Left foot: normal          Right foot: normal       Inspection:          Left foot: normal          Right foot: normal       Nails:          Left foot: normal          Right foot: normal   Impression & Recommendations:  Problem # 1:  ESSENTIAL HYPERTENSION, BENIGN (ICD-401.1) Assessment Unchanged  this is stable and fairly controlled for her age will continue to monitor and watch sodium in diet   BP today: 132/64 Prior BP: 130/70 (04/05/2010)  Labs Reviewed: K+: 4.5 (07/08/2010) Creat: : 0.9 (07/08/2010)   Chol: 145 (07/08/2010)   HDL: 54.10 (07/08/2010)   LDL: 75 (07/08/2010)   TG: 80.0 (07/08/2010)  Orders: Prescription Created Electronically [id number])  Problem # 2:  OSTEOPOROSIS NOS (ICD-733.00) Assessment:  Unchanged  rev nl vit D level- will continue current meds disc safety in the home Her updated medication list for this problem includes:    Miacalcin 200 Unit/act Nasal Soln (Calcitonin (salmon)) .Marland Kitchen... 1 spray in one nostril once daily (alternate nostrils)    Caltrate 600+d 600-400 Mg-unit Tabs (Calcium carbonate-vitamin d) .Marland Kitchen... Take 1 by mouth two times a day  Orders: Prescription Created Electronically [phone])  Problem # 3:  HYPERLIPIDEMIA (ICD-272.4) Assessment: Unchanged  with slt inc in LDL  disc low sat fat diet  control is still fairly good Her updated medication list for this problem  includes:    Lipitor 10 Mg Tabs (Atorvastatin calcium) .Marland Kitchen... 1 by mouth once daily in evening  Labs Reviewed: SGOT: 21 (07/08/2010)   SGPT: 14 (07/08/2010)   HDL:54.10 (07/08/2010), 57.70 (02/16/2010)  LDL:75 (07/08/2010), 66 (02/16/2010)  Chol:145 (07/08/2010), 145 (02/16/2010)  Trig:80.0 (07/08/2010), 105.0 (02/16/2010)  Orders: Prescription Created Electronically [phone])  Problem # 4:  DIABETES MELLITUS, TYPE II (ICD-250.00) Assessment: Deteriorated  this is a bit worse on diet control pt wants to work on diet and exercise before trying any med disc low simple sugar and limited carb diet disc starting 20 min of walking in house daily- until cool enough to go outside  lab and f/u 3 mo  rev labs in detail today Her updated medication list for this problem includes:    Ecotrin 325 Mg Tbec (Aspirin) .Marland Kitchen... Take one by mouth daily  Orders: Prescription Created Electronically [phone])  Complete Medication List: 1)  Detrol La 4 Mg Cp24 (Tolterodine tartrate) .... One by mouth daily 2)  Lipitor 10 Mg Tabs (Atorvastatin calcium) .Marland Kitchen.. 1 by mouth once daily in evening 3)  Ecotrin 325 Mg Tbec (Aspirin) .... Take one by mouth daily 4)  Miacalcin 200 Unit/act Nasal Soln (Calcitonin (salmon)) .Marland Kitchen.. 1 spray in one nostril once daily (alternate nostrils) 5)  Aricept 10 Mg Tabs (Donepezil hcl) ....  Take 1 tablet by mouth once daily 6)  Protonix 40 Mg Tbec (Pantoprazole sodium) .Marland Kitchen.. 1 by mouth once daily in am 7)  Caltrate 600+d 600-400 Mg-unit Tabs (Calcium carbonate-vitamin d) .... Take 1 by mouth two times a day  Patient Instructions: 1)  watch carbohydrates and sweets in diet as much as possible  2)  perhaps smaller portions  3)  aim for 20 minutes of walking daily in the house  4)  check non fasting labs in 3 months AIC and renal and then follow up  5)  no changes in medicines  Prescriptions: PROTONIX 40 MG  TBEC (PANTOPRAZOLE SODIUM) 1 by mouth once daily in am  #30 x 11   Entered and Authorized by:   Judith Part MD   Signed by:   Judith Part MD on 07/12/2010   Method used:   Electronically to        [facility]* (retail)       [address]       [place], [place]  [address]       Ph: [phone]       Fax: [phone]   RxID:   [id number] DETROL LA 4 MG  CP24 (TOLTERODINE TARTRATE) one by mouth daily  #30 x 11   Entered and Authorized by:   Judith Part MD   Signed by:   Judith Part MD on 07/12/2010   Method used:   Electronically to        [facility]* (retail)       [address]       [place], [place]  [address]       Ph: [phone]       Fax: [phone]   RxID:   [id number]   Current Allergies (reviewed today): ! FOSAMAX ! ACTONEL

## 2011-01-20 NOTE — Progress Notes (Signed)
Summary: Rx MIACALCIN 200 UNIT/ACT NASAL SOLN  Phone Note Refill Request Call back at [phone] Message from:  [facility] on January 08, 2010 8:35 AM  Refills Requested: Medication #1:  MIACALCIN 200 UNIT/ACT NASAL SOLN 1 spray in one nostril once daily (alternate nostrils)   Last Refilled: 09/24/2009 Received faxed refill request, please advise   Method Requested: Electronic Initial call taken by: Linde Gillis CMA Duncan Dull),  January 08, 2010 8:36 AM  Follow-up for Phone Call        px written on EMR for call in  Follow-up by: Judith Part MD,  January 08, 2010 9:05 AM  Additional Follow-up for Phone Call Additional follow up Details #1::        Called to [facility]. Additional Follow-up by: Lowella Petties CMA,  January 08, 2010 9:38 AM    Prescriptions: MIACALCIN 200 UNIT/ACT NASAL SOLN (CALCITONIN (SALMON)) 1 spray in one nostril once daily (alternate nostrils)  #1 month x 11   Entered and Authorized by:   Judith Part MD   Signed by:   Lowella Petties CMA on 01/08/2010   Method used:   Telephoned to ...       [facility]* (retail)       [address]       [place], [place]  [address]       Ph: [phone]       Fax: [phone]   RxID:   [id number]

## 2011-01-20 NOTE — Assessment & Plan Note (Signed)
Summary: COUGH/CLE   Vital Signs:  Patient profile:   77 year old female Weight:      135 pounds O2 Sat:      94 % on Room air Temp:     99.9 degrees F tympanic Pulse rate:   90 / minute Pulse rhythm:   regular Resp:     16 per minute BP sitting:   130 / 70  (left arm) Cuff size:   regular  Vitals Entered By: Mervin Hack CMA Duncan Dull) (April 05, 2010 10:07 AM)  O2 Flow:  Room air CC: cough   History of Present Illness: Sister brings her in Has been coughing for 2 days feels a tickle in her throat ?slight sore throat  No fever No night sweats or shakes Mild SOB-- sister notes it more than her (couple of brief episodes of short breath)  No sig mucus No ear pain some nasal congestion with some PND (sister notes constant drainage that is not new)  Allergies: 1)  ! Fosamax 2)  ! Actonel  Past History:  Past medical, surgical, family and social histories (including risk factors) reviewed for relevance to current acute and chronic problems.  Past Medical History: Reviewed history from 03/29/2010 and no changes required. Diabetes mellitus, type II Hyperlipidemia CT chest, 5mm L nodule- needs f/u CT in one year from 10/08 short term memory loss 3/09- (mild cognitive impairment) osteopenia    neurology--Dr Love opthy- Dr Nelle Don  Past Surgical History: Reviewed history from 01/07/2010 and no changes required. Total hip replacement- right (02/2005) Hysterectomy Ischemic colitis- colonoscopy (09/1997) Dexa- osteopenia (10/2000)     improved (10/2001) Squamous cell carcinoma on leg (10/1999) Pelvic vaginal sling for incont (02/2001) Pelvic fracture after a fall (07/2002) Colonoscopy- diverticulosis (1998,  03/2003) CT lung nodule 10/08- re check 1 yr 2010- bilat cataracts removed   Family History: Reviewed history from 12/25/2008 and no changes required. Father: CVA Mother: CVA, dementia Siblings: 5 sisters- 2 breast cancer, 2 brothers- 1 prostate  cancer older sister with alzheimers  sister- mild memory loss  sister with breast cancer (and neices)  Social History: Reviewed history from 12/25/2008 and no changes required. Marital Status: single lives with her sister- who helps care for her (and another sister) Children: none Occupation: retired Engineer, site involved in church activities   Review of Systems       sister notes ongoing memory issues no vomiting or diarrhea appetite is okay  Physical Exam  General:  alert.  NAD Head:  No frontal or maxillary tenderness Ears:  R ear normal and L ear normal.   Nose:  no sig inflammation Mouth:  no erythema and no exudates.   Neck:  supple, no masses, and no cervical lymphadenopathy.   Lungs:  normal respiratory effort, no intercostal retractions, no accessory muscle use, normal breath sounds, no crackles, and no wheezes.     Impression & Recommendations:  Problem # 1:  URI (ICD-465.9) Assessment New seems to be viral infeciton more in chest than head nothing to suggest pneumonia discussed supportive care If worsens, like with purulent sputum, would try empiric antibiotic  Complete Medication List: 1)  Detrol La 4 Mg Cp24 (Tolterodine tartrate) .... One by mouth daily 2)  Lipitor 10 Mg Tabs (Atorvastatin calcium) .Marland Kitchen.. 1 by mouth once daily in evening 3)  Ecotrin 325 Mg Tbec (Aspirin) .... Take one by mouth daily 4)  Miacalcin 200 Unit/act Nasal Soln (Calcitonin (salmon)) .Marland Kitchen.. 1 spray in one nostril once daily (alternate nostrils)  5)  Aricept 10 Mg Tabs (Donepezil hcl) .... Take 1 tablet by mouth once daily 6)  Protonix 40 Mg Tbec (Pantoprazole sodium) .Marland Kitchen.. 1 by mouth once daily in am 7)  Caltrate 600+d 600-400 Mg-unit Tabs (Calcium carbonate-vitamin d) .... Take 1 by mouth two times a day  Patient Instructions: 1)  Please schedule a follow-up appointment as needed .   Current Allergies (reviewed today): ! FOSAMAX ! ACTONEL

## 2011-01-20 NOTE — Assessment & Plan Note (Signed)
Summary: F/U AFTER LABS / LFW   Vital Signs:  Patient profile:   77 year old female Height:      61 inches Weight:      135.25 pounds BMI:     25.65 Temp:     98.1 degrees F oral Pulse rate:   80 / minute Pulse rhythm:   regular BP sitting:   120 / 64  (left arm) Cuff size:   regular  Vitals Entered By: Lewanda Rife LPN (October 19, 2010 9:19 AM) CC: three month f/u after labs   History of Present Illness: here for f/u of DM and HTN   glucose 226 and AIc 7.0 last visit wanted to work on diet  this is unchanged from last time -AIC   eats a plain old country diet  ? did dm classes a long time ago  declines to go   glad she does not eat sweets   wt stable   bp good control 120/64  not very mobile for much exercise  poor short term memory  Allergies: 1)  ! Fosamax 2)  ! Actonel  Past History:  Past Medical History: Last updated: 03/29/2010 Diabetes mellitus, type II Hyperlipidemia CT chest, 5mm L nodule- needs f/u CT in one year from 10/08 short term memory loss 3/09- (mild cognitive impairment) osteopenia    neurology--Dr Love opthy- Dr Nelle Don  Past Surgical History: Last updated: 01/07/2010 Total hip replacement- right (02/2005) Hysterectomy Ischemic colitis- colonoscopy (09/1997) Dexa- osteopenia (10/2000)     improved (10/2001) Squamous cell carcinoma on leg (10/1999) Pelvic vaginal sling for incont (02/2001) Pelvic fracture after a fall (07/2002) Colonoscopy- diverticulosis (1998,  03/2003) CT lung nodule 10/08- re check 1 yr 2010- bilat cataracts removed   Family History: Last updated: 12/25/2008 Father: CVA Mother: CVA, dementia Siblings: 5 sisters- 2 breast cancer, 2 brothers- 1 prostate cancer older sister with alzheimers  sister- mild memory loss  sister with breast cancer (and neices)  Social History: Last updated: 12/25/2008 Marital Status: single lives with her sister- who helps care for her (and another  sister) Children: none Occupation: retired Engineer, site involved in church activities   Risk Factors: Smoking Status: never (07/13/2007)  Review of Systems General:  Denies fatigue, fever, loss of appetite, and malaise. Eyes:  Denies blurring and eye irritation. CV:  Denies chest pain or discomfort, palpitations, shortness of breath with exertion, and swelling of feet. Resp:  Denies cough, shortness of breath, and wheezing. GI:  Denies abdominal pain, change in bowel habits, and indigestion. GU:  Denies urinary frequency. MS:  Denies muscle aches and cramps. Derm:  Denies lesion(s), poor wound healing, and rash. Neuro:  Complains of memory loss; denies numbness and tingling. Psych:  Denies anxiety and depression. Endo:  Denies excessive thirst and excessive urination. Heme:  Denies abnormal bruising and bleeding.  Physical Exam  General:  elderly female/ overwt in no distress Head:  normocephalic, atraumatic, and no abnormalities observed.   Eyes:  vision grossly intact, pupils equal, pupils round, and pupils reactive to light.  no conjunctival pallor, injection or icterus  Mouth:  pharynx pink and moist.   Neck:  supple with full rom and no masses or thyromegally, no JVD or carotid bruit  Lungs:  normal respiratory effort, no intercostal retractions, no accessory muscle use, normal breath sounds, no crackles, and no wheezes.   Heart:  Normal rate and regular rhythm. S1 and S2 normal without gallop, murmur, click, rub or other extra sounds. Abdomen:  Bowel sounds positive,abdomen soft and non-tender without masses, organomegaly or hernias noted. no renal bruits  Pulses:  R and L carotid,radial,femoral,dorsalis pedis and posterior tibial pulses are full and equal bilaterally Extremities:  No clubbing, cyanosis, edema, or deformity noted with normal full range of motion of all joints.   Neurologic:  sensation intact to light touch, gait normal, and DTRs symmetrical and normal.    Skin:  Intact without suspicious lesions or rashes Cervical Nodes:  No lymphadenopathy noted Psych:  normal affect, talkative and pleasant  is somewhat timid and quiet today  Diabetes Management Exam:    Foot Exam (with socks and/or shoes not present):       Sensory-Pinprick/Light touch:          Left medial foot (L-4): normal          Left dorsal foot (L-5): normal          Left lateral foot (S-1): normal          Right medial foot (L-4): normal          Right dorsal foot (L-5): normal          Right lateral foot (S-1): normal       Sensory-Monofilament:          Left foot: normal          Right foot: normal       Inspection:          Left foot: normal          Right foot: normal       Nails:          Left foot: normal          Right foot: normal   Impression & Recommendations:  Problem # 1:  ESSENTIAL HYPERTENSION, BENIGN (ICD-401.1) Assessment Unchanged  this is well controlled without med  will need to disc renal protection  in future, however in light of DM  BP today: 120/64 Prior BP: 132/64 (07/12/2010)  Labs Reviewed: K+: 4.2 (10/12/2010) Creat: : 0.9 (10/12/2010)   Chol: 145 (07/08/2010)   HDL: 54.10 (07/08/2010)   LDL: 75 (07/08/2010)   TG: 80.0 (07/08/2010)  Problem # 2:  DIABETES MELLITUS, TYPE II (ICD-250.00) pt does not have good idea of DM diet but declines classes at this time recommended sugar busters book start metformin and update check sugar once daily lab and f/u 3 mo  Her updated medication list for this problem includes:    Ecotrin 325 Mg Tbec (Aspirin) .Marland Kitchen... Take one by mouth daily    Metformin Hcl 500 Mg Tabs (Metformin hcl) .Marland Kitchen... 1 by mouth two times a day  Complete Medication List: 1)  Detrol La 4 Mg Cp24 (Tolterodine tartrate) .... One by mouth daily 2)  Lipitor 10 Mg Tabs (Atorvastatin calcium) .Marland Kitchen.. 1 by mouth once daily in evening 3)  Ecotrin 325 Mg Tbec (Aspirin) .... Take one by mouth daily 4)  Miacalcin 200 Unit/act Nasal Soln  (Calcitonin (salmon)) .Marland Kitchen.. 1 spray in one nostril once daily (alternate nostrils) 5)  Aricept 10 Mg Tabs (Donepezil hcl) .... Take 1 tablet by mouth once daily 6)  Protonix 40 Mg Tbec (Pantoprazole sodium) .Marland Kitchen.. 1 by mouth once daily in am 7)  Caltrate 600+d 600-400 Mg-unit Tabs (Calcium carbonate-vitamin d) .... Take 1 by mouth two times a day 8)  Metformin Hcl 500 Mg Tabs (Metformin hcl) .Marland Kitchen.. 1 by mouth two times a day 9)  Glucometer  .... To check sugar once  daily and as needed for dm 250.0 10)  Glucose Test Strips and Lancets  .... To check sugar once daily and as needed for dm 250.0  Patient Instructions: 1)  check your sugar on MWF-- in am fasting  2)  on T, TH, sat , sun -- 2 hours after afternoon or evening meal  3)  start metformin two times a day -- update me if any problems  4)  schedule fasting labs in 3 months lipid/ast/alt/ AIC/ renal 250.0 , 272  and 401.1   then follow up  5)  get the book SUGAR BUSTERS at any bookstore for some guidance  6)  try to minimize starches and sweets in diet  Prescriptions: GLUCOSE TEST STRIPS AND LANCETS to check sugar once daily and as needed for DM 250.0  #100 each x 3   Entered and Authorized by:   Judith Part MD   Signed by:   Judith Part MD on 10/19/2010   Method used:   Print then Give to Patient   RxID:   [id number] GLUCOMETER to check sugar once daily and as needed for DM 250.0  #1 x 0   Entered and Authorized by:   Judith Part MD   Signed by:   Judith Part MD on 10/19/2010   Method used:   Print then Give to Patient   RxID:   [id number] METFORMIN HCL 500 MG TABS (METFORMIN HCL) 1 by mouth two times a day  #60 x 11   Entered and Authorized by:   Judith Part MD   Signed by:   Judith Part MD on 10/19/2010   Method used:   Print then Give to Patient   RxID:   [id number]    Orders Added: 1)  Est. Patient Level III [18841]    Current Allergies (reviewed today): ! FOSAMAX ! ACTONEL

## 2011-01-24 ENCOUNTER — Ambulatory Visit (INDEPENDENT_AMBULATORY_CARE_PROVIDER_SITE_OTHER): Payer: Medicare Other | Admitting: Family Medicine

## 2011-01-24 ENCOUNTER — Encounter: Payer: Self-pay | Admitting: Family Medicine

## 2011-01-24 DIAGNOSIS — E119 Type 2 diabetes mellitus without complications: Secondary | ICD-10-CM

## 2011-01-24 DIAGNOSIS — I10 Essential (primary) hypertension: Secondary | ICD-10-CM

## 2011-01-24 DIAGNOSIS — E785 Hyperlipidemia, unspecified: Secondary | ICD-10-CM

## 2011-02-09 NOTE — Assessment & Plan Note (Signed)
Summary: 3 MONTH FOLLOW-UP/[facility]   Nurse Visit   Vital Signs:  Patient profile:   77 year old female Height:      61 inches Weight:      130.25 pounds BMI:     24.70 Temp:     97.4 degrees F oral Pulse rate:   88 / minute Pulse rhythm:   regular BP sitting:   126 / 64  (left arm) Cuff size:   regular  Vitals Entered By: Lewanda Rife LPN (January 24, 2011 2:09 PM)  History of Present Illness: here for f/u of HTN and DM and lipids   feels fine - nothing new   wt is down about 5 lb     HTN is well controlled with 126/64 today- no problems at all with med  [facility] is 6.8 down from 7 on metformin  sugars fasting are great 90s to low 100s her 2h pp are quite variable -- usually 140s to low 200s  sister is going to DM teaching so she can make meals  she does not eat sweets    diet - is about the same  not much exercise   lipids  Last Lipid ProfileCholesterol: 144 (01/20/2011 12:40:16 PM)HDL:  52.10 (01/20/2011 12:40:16 PM)LDL:  78 (01/20/2011 12:40:16 PM)Triglycerides:  Last Liver profileSGOT:  18 (01/20/2011 12:40:16 PM)SPGT:  11 (01/20/2011 12:40:16 PM)T. 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) Hysterectomy Ischemic colitis- colonoscopy (09/1997) Dexa- osteopenia (10/2000)     improved (10/2001) Squamous cell carcinoma on leg (10/1999) Pelvic vaginal sling for incont (02/2001) Pelvic fracture after a fall (07/2002) Colonoscopy- diverticulosis (1998,  03/2003) CT lung nodule 10/08- re check 1 yr 2010- bilat cataracts removed   Family History: Last updated:  12/25/2008 Father: CVA Mother: CVA, dementia Siblings: 5 sisters- 2 breast cancer, 2 brothers- 1 prostate cancer older sister with alzheimers  sister- mild memory loss  sister with breast cancer (and neices)  Social History: Last updated: 12/25/2008 Marital Status: single lives with her sister- who helps care for her (and another sister) Children: none Occupation: retired Engineer, site involved in church activities   Risk Factors: Smoking Status: never (07/13/2007)   Review of Systems General:  Denies fatigue, fever, loss of appetite, and malaise. Eyes:  Denies blurring and eye irritation. CV:  Denies chest pain or discomfort, lightheadness, and palpitations. Resp:  Denies cough, shortness of breath, and wheezing. GI:  Denies abdominal pain, change in bowel habits, indigestion, and nausea. GU:  Denies dysuria and urinary frequency. MS:  Denies muscle aches and cramps. Derm:  Denies itching, lesion(s), poor wound healing, and rash. Neuro:  Denies numbness and tingling. Psych:  Denies anxiety and depression. Endo:  Denies cold intolerance, excessive thirst, excessive urination, and heat intolerance. Heme:  Denies abnormal bruising and bleeding.   Impression & Recommendations:  Problem # 1:  ESSENTIAL HYPERTENSION, BENIGN (ICD-401.1) Assessment Unchanged bp is controlled without med - continue to watch BP today: 126/64 Prior BP: 120/64 (10/19/2010)  Labs Reviewed: K+: 4.3 (01/20/2011) Creat: : 0.7 (01/20/2011)   Chol: 144 (01/20/2011)   HDL: 52.10 (01/20/2011)   LDL: 78 (01/20/2011)  TG: 72.0 (01/20/2011)  Problem # 2:  HYPERLIPIDEMIA (ICD-272.4) Assessment: Unchanged  lipids are in very good control with statin and diet  rev low sat fat diet  f/u 6 mo after labs  Her updated medication list for this problem includes:    Lipitor 10 Mg Tabs (Atorvastatin calcium) .Marland Kitchen... 1 by mouth once daily in evening  Labs Reviewed: SGOT: 18 (01/20/2011)   SGPT: 11  (01/20/2011)   HDL:52.10 (01/20/2011), 54.10 (07/08/2010)  LDL:78 (01/20/2011), 75 (07/08/2010)  Chol:144 (01/20/2011), 145 (07/08/2010)  Trig:72.0 (01/20/2011), 80.0 (07/08/2010)  Problem # 3:  DIABETES MELLITUS, TYPE II (ICD-250.00) Assessment: Improved  this has improved slt with better diet  now enc more exercise- as tolerated  most high sugars are pp  her sister will attend DM teaching and start prep her meals based on dm diet  lab and f/u 6 mo  continue checking sugars once per day - some pp  Her updated medication list for this problem includes:    Ecotrin 325 Mg Tbec (Aspirin) .Marland Kitchen... Take one by mouth daily    Metformin Hcl 500 Mg Tabs (Metformin hcl) .Marland Kitchen... 1 by mouth two times a day  Labs Reviewed: Creat: 0.7 (01/20/2011)     Last Eye Exam: normal (12/23/2010) Reviewed HgBA1c results: 6.8 (01/20/2011)  7.0 (10/12/2010)  Complete Medication List: 1)  Detrol La 4 Mg Cp24 (Tolterodine tartrate) .... One by mouth daily 2)  Lipitor 10 Mg Tabs (Atorvastatin calcium) .Marland Kitchen.. 1 by mouth once daily in evening 3)  Ecotrin 325 Mg Tbec (Aspirin) .... Take one by mouth daily 4)  Miacalcin 200 Unit/act Nasal Soln (Calcitonin (salmon)) .Marland Kitchen.. 1 spray in one nostril once daily (alternate nostrils) 5)  Aricept 10 Mg Tabs (Donepezil hcl) .... Take 1 tablet by mouth once daily 6)  Protonix 40 Mg Tbec (Pantoprazole sodium) .Marland Kitchen.. 1 by mouth once daily in am 7)  Caltrate 600+d 600-400 Mg-unit Tabs (Calcium carbonate-vitamin d) .... Take 1 by mouth two times a day 8)  Metformin Hcl 500 Mg Tabs (Metformin hcl) .Marland Kitchen.. 1 by mouth two times a day 9)  Glucometer  .... To check sugar once daily and as needed for dm 250.0 10)  Onetouch Ultra Blue Strp (Glucose blood) .... Check blood sugar once a day and as directed 11)  Lancets Misc (Lancets) .... Check blood sugar once a day and as directed   Patient Instructions: 1)  start learning more about diabetic diet and planning meals according to that  2)  try  to get as much exercise as you tolerate  3)  no change in medicines  4)  keep checking sugar -- some days in am and some days after a meal - that is fine  5)  no change in medicines 6)  schedule fasting labs and then follow up for check up in 6 months  7)  lipid/ast/alt/ renal/ AIC/ vit D / tsh for 272 and 250.0/ 733.0   Physical Exam  General:  elderly female/ overwt in no distress Head:  normocephalic, atraumatic, and no abnormalities observed.   Eyes:  vision grossly intact, pupils equal, pupils round, and pupils reactive to light.  no conjunctival pallor, injection or icterus  Mouth:  pharynx pink and moist.   Neck:  supple with full rom and no masses or thyromegally, no JVD or carotid bruit  Chest Wall:  No deformities, masses, or tenderness noted. Lungs:  normal respiratory effort, no intercostal retractions, no accessory muscle use, normal breath sounds, no crackles, and no  wheezes.   Heart:  Normal rate and regular rhythm. S1 and S2 normal without gallop, murmur, click, rub or other extra sounds. Abdomen:  no renal bruits  Msk:  No deformity or scoliosis noted of thoracic or lumbar spine.  some baseline kyphosis  Pulses:  R and L carotid,radial,femoral,dorsalis pedis and posterior tibial pulses are full and equal bilaterally Extremities:  No clubbing, cyanosis, edema, or deformity noted with normal full range of motion of all joints.   Neurologic:  sensation intact to light touch and gait normal.   Skin:  Intact without suspicious lesions or rashes Cervical Nodes:  No lymphadenopathy noted Psych:  normal affect, talkative and pleasant  is somewhat timid and quiet today  Diabetes Management Exam:    Foot Exam (with socks and/or shoes not present):       Sensory-Pinprick/Light touch:          Left medial foot (L-4): normal          Left dorsal foot (L-5): normal          Left lateral foot (S-1): normal          Right medial foot (L-4): normal          Right dorsal foot (L-5):  normal          Right lateral foot (S-1): normal       Sensory-Monofilament:          Left foot: normal          Right foot: normal       Inspection:          Left foot: normal          Right foot: normal       Nails:          Left foot: normal          Right foot: normal  CC: three  month f/u   Allergies: 1)  ! Fosamax 2)  ! Actonel  Orders Added: 1)  Est. Patient Level IV [04540]  Current Allergies (reviewed today): ! FOSAMAX ! ACTONEL

## 2011-02-18 ENCOUNTER — Other Ambulatory Visit: Payer: Self-pay | Admitting: Family Medicine

## 2011-02-18 DIAGNOSIS — Z1231 Encounter for screening mammogram for malignant neoplasm of breast: Secondary | ICD-10-CM

## 2011-04-11 ENCOUNTER — Encounter: Payer: Self-pay | Admitting: Family Medicine

## 2011-04-11 ENCOUNTER — Ambulatory Visit (INDEPENDENT_AMBULATORY_CARE_PROVIDER_SITE_OTHER): Payer: Medicare Other | Admitting: Family Medicine

## 2011-04-11 VITALS — BP 114/62 | HR 78 | Temp 97.6°F | Ht 65.0 in | Wt 124.1 lb

## 2011-04-11 DIAGNOSIS — J40 Bronchitis, not specified as acute or chronic: Secondary | ICD-10-CM

## 2011-04-11 LAB — HM DIABETES EYE EXAM

## 2011-04-11 LAB — HM MAMMOGRAPHY

## 2011-04-11 MED ORDER — AZITHROMYCIN 250 MG PO TABS: ORAL_TABLET | ORAL | Status: DC

## 2011-04-11 MED ORDER — AZITHROMYCIN 250 MG PO TABS: ORAL_TABLET | ORAL | Status: AC

## 2011-04-11 NOTE — Progress Notes (Signed)
SUBJECTIVE:  [name] is a 77 y.o. female who complains of congestion, sneezing, sore throat and productive cough for 7 days. She denies a history of chest pain, chills, dizziness, fatigue, fevers, myalgias, nausea, shortness of breath and weakness and denies a history of asthma. Patient denies smoke cigarettes.   The PMH, PSH, Social History, Family History, Medications, and allergies have been reviewed in [facility], and have been updated if relevant.  OBJECTIVE: BP 114/62  Pulse 78  Temp(Src) 97.6 F (36.4 C) (Oral)  Ht 5\' 5"  (1.651 m)  Wt 124 lb 1.9 oz (56.3 kg)  BMI 20.65 kg/m2  She appears well, vital signs are as noted. Ears normal.  Throat and pharynx normal.  Neck supple. No adenopathy in the neck. Nose is congested. Sinuses non tender.  Lungs:  Scattered exp wheezes on left, no increased WOB or crackles. Right side clear.  ASSESSMENT:  viral upper respiratory illness and bronchitis  PLAN: Symptomatic therapy suggested: push fluids, rest and return office visit prn if symptoms persist or worsen.  Will also give rx for Zpack given her lung exam and functional status (could quickly develop PNA).

## 2011-04-11 NOTE — Progress Notes (Signed)
Addended by: Linde Gillis on: 04/11/2011 12:22 PM   Modules accepted: Orders

## 2011-04-19 ENCOUNTER — Telehealth: Payer: Self-pay | Admitting: *Deleted

## 2011-04-19 NOTE — Telephone Encounter (Signed)
Spoke with [name] and advised her as instructed via telephone.

## 2011-04-19 NOTE — Telephone Encounter (Signed)
Patient finished her zpak on Friday. She is feeling better other than still having some rattling in her chest. [name] is asking if she should do another round of antibiotic. Uses [facility].

## 2011-04-19 NOTE — Telephone Encounter (Signed)
Spoke with patient via telephone and could tell she did not understand what I was explaining to her.  Asked her to have [name] give me a call here at the office.

## 2011-04-19 NOTE — Telephone Encounter (Signed)
No abx should still be in her system. Continue supportive care.

## 2011-05-06 NOTE — Op Note (Signed)
[name], [name] NO.:  [id number]   MEDICAL RECORD NO.:  [id number]          PATIENT TYPE:  INP   LOCATION:  0004                         FACILITY:  [facility]   PHYSICIAN:  Kerrin Champagne, M.D.   DATE OF BIRTH:  1922/11/21   DATE OF PROCEDURE:  02/23/2005  DATE OF DISCHARGE:                                 OPERATIVE REPORT   PREOPERATIVE DIAGNOSES:  Right total hip arthroplasty with liner worn,  hairline greater trochanter fracture with osteolysis occurring about the  greater trochanter.   POSTOPERATIVE DIAGNOSES:  Right hip replacement, liner worn, with greater  trochanter fracture secondary to debris wear.   PROCEDURE:  1.  Revision, right total hip arthroplasty liner, using a 10 degree      Osteonics OmniFit cup insert, 32 mm socket.  The patient had replacement      of the femoral head, Morse taper head to a 32 mm head.  2.  Debridement of wear debris from the right total hip arthroplasty and      open reduction/internal fixation of right greater trochanter fracture      using two Vitallium alloy wires, 2.0 mm in diameter, that were beaded,      and tension banding the greater trochanter into position.  She underwent      allograft bone graft to the greater trochanter fracture site.   SURGEON:  Kerrin Champagne, M.D.   ASSISTANT:  [name], PA-C.   ANESTHESIA:  General via orotracheal intubation, Dr. Rica Mast.   SPECIMENS:  Cultures, anaerobic and aerobic, right hip, were sent.  Gram's  stain inconclusive regarding any organisms seen.   ESTIMATED BLOOD LOSS:  200 cc.   COMPLICATIONS:  None.  Patient returned to the PICU in good condition.   CLINICAL HISTORY:  The patient is an 77 year old female who had fallen  several years ago and was seen in regards to her hip.  Had a pubic ramus  fracture at that time.  She has had surgeries to her left hip for a left hip  open reduction/internal fixation.  She has been experiencing pain into her  right  thigh with difficulty standing and walking.  She has had a history of  previous right hip surgery by Dr. [name], total hip arthroplasty.  She was  doing well up until about two weeks ago, apparently with increasing pain  when she rolls onto the right side.  Previously seen in the office, where  radiographs demonstrated significant polyethylene wear within the acetabular  liner with the head of the prosthesis nearly subluxed superiorly due to  thinning of the liner.  The patient on radiographs preoperatively also  demonstrates a hairline fracture of the greater trochanter.  This places her  at some risk of undergoing further fracture during the procedure.   INTRAOPERATIVE FINDINGS:  The patient was found to have a significant amount  of wear debris within the right hip total hip arthroplasty with reactive  changes.  This was debrided as best as possible.  She underwent a hip  capsulotomy anteriorly, superiorly, and posteriorly, and a revision  of the  femoral component head to a +5 32 mm head.  The acetabular shell was removed  without much difficulty and replaced with an OmniFit 32 mm liner.  As this  was a 50 mm shell, it was felt that this liner could be placed without much  difficulty.  We are concerned about accelerated wear.   DESCRIPTION OF PROCEDURE:  After adequate general anesthesia, the patient  was placed in the left lateral decubitus position, right side up.  A Mark IV  frame was used to hold the patient in place.  All pressure points well-  padded.  Standard preoperative antibiotics with Ancef.  She underwent a  Duraprep prep to the right lower extremity from the ankle circumferential  about the leg to the upper thigh, about the right buttock to the right lower  ribs.  She was draped in the usual manner.  Iodine Vidrape was used.  Prior  to placement of the Vidrape, the skin incision was marked out.  In fact,  this was done even prior to prep to insure localization of the  previous  incision scar.  Following draping then, incision was made, lifting the old  incision scar over the mid portion of the scar, and the incision laid about  12-14 cm in length through the skin and subcutaneous layers, down to the  tensor fascia.  The incision was made in the tensor fascia in the old  incision scar, removing the old Ethibond suture that had been retained.  Incision was then carried sharply through the tensor fascia lata to the  greater trochanter.  The greater trochanteric bursa was resected.   The incision carried distally in the insertion of the fascia into the  posterior aspect of the femur.  It was incised over its proximal one-third.  A Charnley retractor inserted, retracting the fascia lata and tensor muscle  medial, both anterior and posterior.  The short external rotators were then  carefully incised off of the posterior aspect of the proximal femur and then  carried along the gluteus minimus muscle superiorly, using this to make a  large flap over the posterior capsule, exposing the posterior hip joint.  Ethibond sutures were placed into this for retraction purposes.  The  posterior aspect of the acetabular shell was then carefully debrided in  order to visualize this area.  Distally, as well, the distal portions of the  shell were then carefully exposed, removing debris and scar tissue as  appropriate.  A bone hook was used for retraction over the proximal femur,  and the anterior capsule was then excised.  The head and neck Morse taper  was then carefully broken, using the vice grip-type instrument that was  placed over the head and neck and used to carefully disimpact the head and  neck.  Additional tapping of the [facility] taper was necessary to complete the  disengagement and to allow for removal of the head so that further exposure  of the acetabulum could be obtained.  Further portions of the anterior capsule and superior aspect of the capsule were then  able to be resected,  and the entire circumferential area of the acetabular shell was able to be  exposed.  A Colby retractor placed over the anterior aspect of the patient's  pelvis and another placed within the obturator foramen.   With this, then a drill hole was placed into the superior medial aspect of  the patient's liner and then a large fragment, fully threaded screw  was able  to be screwed into place, loosening the liner and allowing for its  extraction from the acetabular shell.  With this completed, further exposure  was obtained circumferentially about the shell of the acetabulum.  Central  poles of the acetabular implant noted to have significant wear debris also  present; however, the shell itself appeared to be well fixed to the  acetabulum and immobile.  The femur, with the removal of the [facility] taper  head and neck, there was displacement of the greater trochanter fracture  that was already fractured preoperatively.  This was fixed with tension band  wires at the end of the case.  After completely exposing the acetabular  metallic shell circumferentially within the acetabulum, a permanent 10  degree OmniFit 32 mm hip liner was then able to be inserted after careful  irrigation of this area and removal of any debris from the metallic shell.  The liner was then carefully positioned with a 10 degree lip posteriorly  positioned.  This was then impacted into place and the shell seated nicely  and Therapist, nutritional used to carefully examine the circumference of the liner  acetabular shell interface, indicating there were no areas of loosening or  __________ tissue.  With this, then the 32 mm head was then carefully  brought onto the field.  A 2 mm Vitallium mesh wire was then passed through  drill holes of the proximal femur just distal to the transverse fracture  site by about 2.5 cm.  Cautery used to divide the vastus lateralis here and  then drill hole placed, and the Vitallium  wire then placed through these  areas.  These were brought up over the top of the greater trochanter tip  proximally and then over the lateral aspect of the trochanteric fracture  region.  The portion of Gelfoam was then placed over the neck of the  prosthesis, and then bone graft was then able to be placed.  The Vitallium  wire was then placed through their sleeves and then tightened with the hip  and abduction until the trochanteric fracture was then reduced and the  sleeve primped with the printing on the sleeve downward.  The portion of  wire that remained was then clipped close to the sleeve to allow for the  wire to be shortened appropriately.  With this then, care was taken to  additionally bone graft the greater trochanter using a portion of Gelfoam to  hold it against the medial aspect of the trochanter and to prevent it from  becoming a portion of the joint or falling into the joint with motion. Irrigation was performed, and the Hemovac drain was placed within the depth  of the incision.  The posterior hip capsule and short external rotators then  approximated to the proximal posterior aspect of the femur with drill holes  using #1 Ethibond sutures.  Also, then approximated to the greater  trochanter proximally and posteriorly as well as the gluteus minimus  superiorly, basically providing coverage over the posterior aspect of the  hip and decreasing dead space here.  With this completed, irrigation again  performed and the tensor fascia lata was then reapproximated with  interrupted #1 Vicryl sutures.  The superficial fascia overlying the tensor  muscle proximally with interrupted #1 Vicryl sutures.  Deep subcu layers  approximated with interrupted #1 and 0 Vicryl sutures.  The more superficial  layers with interrupted 2-0 Vicryl sutures.  The skin closed with a running  subcu  stitch of 4-0 Vicryl.  Tincture of Benzoin and Steri-Strips was  applied, and 4x4s and ABD pads affixed  to the skin with Hypafix tape.  The  patient was then returned to a supine position, a knee immobilizer placed,  and the patient was then reactivated, extubated, and returned to the  recovery room in satisfactory condition.  All instrument and sponge counts  were correct.      JEN/MEDQ  D:  02/23/2005  T:  02/23/2005  Job:  [id number]

## 2011-05-06 NOTE — Discharge Summary (Signed)
[facility]  Patient:    [name], [name] Visit Number: [id number] MRN: [id number]          Service Type: MED Location: [id number] Attending Physician:  Lubertha South Dictated by:   Alexzandrew L. Perkins, P.A.-C. Admit Date:  04/04/2002 Disc. Date: 04/09/02                             Discharge Summary  ADMITTING DIAGNOSES: 1. Probable right pubic ramus fracture. 2. History of incontinence. 3. Arthritis. 4. Erosion degeneration of right acetabulum, as per CT.  DISCHARGE DIAGNOSES: 1. Probable right pubic ramus fracture. 2. History of incontinence. 3. Arthritis. 4. Erosion degeneration of right acetabulum, as per CT. 5. Urinary tract infection, enterococcus.  PROCEDURES:  None.  CONSULTATIONS:  None.  BRIEF HISTORY:  The patient is a 77 year old female who has had previous right total hip by Dr. [name] in 1988.  The patient unfortunately had a fall in the [employer] parking lot on the date of admission.  This occurred over at [facility].  She did have right hip pain with weightbearing.  She was brought to [facility], where x-rays revealed probable pubic ramus fracture.  She was seen and evaluated by Dr. Vira Browns and admitted for pain control.  LABORATORY DATA:  A urinalysis was taken on April 07, 2002 which did show moderate leukocyte esterase with 36 white cells and 0-2 red cells and some rare bacteria.  Urine culture was also ordered which did prove to be positive for enterococcus species.  X-rays:  AP pelvis:  Moderate degenerative changes in the lumbar spine.  Total right hip prosthesis.  Two views of the right hip showed no evidence of acute process.  Total hip prosthesis was noted again.  Films were reviewed by Dr. Otelia Sergeant and felt the patient had a probable pubis ramus fracture.  CT scan: Bony destruction of the posterior central portion of the right acetabulum adjacent to the acetabular cup,  most likely represented a giant cell reaction. No fractures.  Severe degenerative changes in the lumbar spine.  No chest x-ray or EKG is seen on the chart.  HOSPITAL COURSE:  The patient was admitted to [facility] on April 04, 2002, admitted to the orthopedic floor.  Physical therapy was consulted to assist with gait training ambulation for the pubic ramus fracture.  She was initially slow to progress with physical therapy and only was ambulating approximately 30 feet by postoperative day #2.  Rehab services were consulted.  The patient was seen and evaluated and felt that she may benefit from undergoing a short stay on the rehab or subacute unit stay.  She continued to be followed.  She was initially placed on Vicodin; however, did have nausea from this medication.  She was switched over to Darvocet, which did help control her pain.  She continued to progress well on physical therapy, up to the point where she was ambulating greater than 250 feet by hospital day #4 and it was felt she was doing so well that she would not require inpatient stay.  Therefore, discharge planning was consulted to arrange for equipment and home health PT evaluation.  Once arrangements had been made and the patient is doing well, it was decided that she was discharged home on April 09, 2002.  DISCHARGE PLAN: 1. The patient discharged home on April 09, 2002. 2. Discharge diagnoses:  Please see above.  3. Discharge medications:    a. Darvocet for pain.    b. Levaquin for urinary tract infection. 4. Diet:  Diet as tolerated. 5. Followup:  The patient will follow up approximately 10 days from discharge.    Call the office for an appointment at [phone]. 6. Activity:  The patient will be weightbearing as tolerated.  Up with walker    for ambulation.  She may be up as tolerated.  Home health physical therapy    evaluation will be ordered.  DISPOSITION:  She will be discharged home with her  sister.  CONDITION UPON DISCHARGE:  Improved. Dictated by:   Alexzandrew L. Perkins, P.A.-C. Attending Physician:  Lubertha South DD:  04/09/02 TD:  04/09/02 Job: [id number]

## 2011-05-06 NOTE — Op Note (Signed)
[facility]  Patient:    [name], [name]                       MRN: [id number] Proc. Date: 03/09/01 Adm. Date:  [id number] Attending:  Londell Moh CC:         Roxy Manns, M.D. [facility]   Operative Report  SERVICE:  Urology.  PREOPERATIVE DIAGNOSIS:  Stress urinary incontinence.  POSTOPERATIVE DIAGNOSIS:  Stress urinary incontinence.  PROCEDURE:  Sparc tension free vaginal tape, cystoscopy.  SURGEON:  Dr. Logan Bores.  ANESTHESIA:  IV sedation with local.  COMPLICATIONS:  None.  DRAINS:  A 16 French catheter.  BRIEF HISTORY:  This 77 year old female has well documented stress urinary incontinence. The patient has urethral hypermobility causing loss of urine. The patient is interested in the simplest procedure possible. She does not have an enlarged associated cystocele, rectocele, or enterocele and for that reason can undergo a simple sling. The patients elected to undergo the tension free vaginal tape using the Sparc system. The patient understands the risks and benefits of the procedure including the possibility of retention, possible need for hospital stay. The operator intended to try to do this under local anesthesia on an outpatient basis. The patient gave full and informed consent.  DESCRIPTION OF PROCEDURE:  After successful induction of adequate IV sedation, the patient was placed in the dorsal lithotomy position and prepped with Betadine and draped in the usual sterile fashion. The patients introitus was somewhat small and a standard weighted vaginal speculum could not be placed. For that reason, a Deaver retractor was used for exposure. The anterior vaginal mucosa was infiltrated with local anesthetic with the plan for a mid urethral incision higher than the standard pubovaginal sling. Posterior dissection went back into the endopelvic fascia and was identified which was not opened. Two fingerbreadths were used to find the appropriate  location for the abdominal incisions which were made just the width of one scalpel blade using local infiltration. The Sparc carriers were then passed from the top incision behind the bone to the bottom incision. Cystoscopy was performed. The bladder was carefully inspected and was free of any tumor or stones. Both ureteral orifices were normal in configuration or location. There was on injury to the bladder from passage of the needles. The tape was then pulled from the bottom incision to the top incision. Tensioning was done by placing first a right angled clamp and then a #8 Hegar sized implement underneath the sling to prevent it from being over corrected. The bladder was filled and there was no loss of urine when the patient was asked to cough but with a strong Crede maneuver equivalent to what would be obtained with voiding, there was no loss of urine. There was appropriate loss of urine with a non-deviated urinary streams. Cystoscopic examination showed that the urethra was well supported but not angulated. The protective covering for the tape was then pulled away and a final positioning was done by using the blue string and it appeared that this was well positioned without excessive tension and just gentle support. There was space enough for a right angled clamp and the 8 Hegar to be passed underneath the sling. The mucosa was then closed with a running suture of 2-0 Vicryl. The top incision was closed with a 4-0 Vicryl single stitch in the subcutaneous tissues plus Steri-Strips. The patient had vaginal packing applied. She will be given a voiding trial in the outpatient department  and if able to void can be sent home without a catheter. DD:  03/09/01 TD:  03/10/01 Job: [id number]

## 2011-05-06 NOTE — Op Note (Signed)
[name], [name]                          ACCOUNT NO.:  [id number]   MEDICAL RECORD NO.:  [id number]                   PATIENT TYPE:  AMB   LOCATION:  NESC                                 FACILITY:  [facility]   PHYSICIAN:  Jamison Neighbor, M.D.               DATE OF BIRTH:  1922-03-31   DATE OF PROCEDURE:  04/22/2003  DATE OF DISCHARGE:                                 OPERATIVE REPORT   PREOPERATIVE DIAGNOSIS:  Sling erosion.   POSTOPERATIVE DIAGNOSIS:  Sling erosion.   PROCEDURE:  Sling revision.   SURGEON:  Jamison Neighbor, M.D.   ANESTHESIA:  General.   COMPLICATIONS:  None.   DRAINS:  None.   BRIEF HISTORY:  This 77 year old female underwent pubovaginal sling with  excellent control of her stress urinary incontinence. This was done back in  2002. The patient recently developed some problems with hematuria and was  noted to have no actual hematuria but actual bleeding from the vagina. On  careful examination, there was a small area where the sling was noted to  have eroded and the patient is noted to have some atrophy to the tissue due  to longstanding lack of estrogen. The patient is to undergo removal of some  of that sling, reclosure and further evaluation. The patient understands the  risks and benefits of the procedure and gave full and informed consent.   DESCRIPTION OF PROCEDURE:  After successful induction of general anesthesia,  the patient was placed in the dorsal lithotomy position prepped with  Betadine and draped in the usual sterile fashion. A weighted vaginal  speculum was placed. Careful examination showed that there was a small  amount of sling material eroding more on the patient's left hand side than  on the right. A small slap of tissue was developed and that small piece of  sling material was grasped and removed. Careful inspection showed there was  no other areas of erosion and the entire sling did not need to be exposed.  This did not in any way  impact on the patient's support. The area was  carefully and irrigated and then closed with raising of a new flap that was  then elevated over that area. This was closed with a series of interrupted  sutures of 3-0 Vicryl placed in a figure-of-eight fashion. The patient had  Estrace cream placed in the vagina, she tolerated the procedure well and was  taken to the recovery room in good condition. The Foley catheter that had  been inserted during the procedure was removed. The patient was sent to the  recovery room in good condition. We will place her on Estrace cream. We ill  have her return to see [place] in followup in two to three weeks time. She will  also be given Darvocet-N 100 for pain.  Jamison Neighbor, M.D.    RJE/MEDQ  D:  04/22/2003  T:  04/22/2003  Job:  [id number]

## 2011-05-06 NOTE — Discharge Summary (Signed)
[name], [name]                ACCOUNT NO.:  [id number]   MEDICAL RECORD NO.:  [id number]          PATIENT TYPE:  INP   LOCATION:  0476                         FACILITY:  [facility]   PHYSICIAN:  Kerrin Champagne, M.D.   DATE OF BIRTH:  1922/02/25   DATE OF ADMISSION:  02/23/2005  DATE OF DISCHARGE:  02/26/2005                                 DISCHARGE SUMMARY   ADMISSION DIAGNOSIS:  1.  Right total hip arthroplasty with liner worn and hairline greater      trochanter fracture with osteolysis occurring about greater trochanter.  2.  Gastroesophageal reflux disease.  3.  Dyslipidemia.  4.  Urinary incontinence, status post bladder sling.  5.  Osteoarthritis.  6.  Diet controlled diabetes.  7.  Status post hysterectomy.  8.  Status post pelvic fracture.   DISCHARGE DIAGNOSES:  1.  Right total hip arthroplasty with liner worn and hairline greater      trochanter fracture with osteolysis occurring about greater trochanter.  2.  Gastroesophageal reflux disease.  3.  Dyslipidemia.  4.  Urinary incontinence, status post bladder sling.  5.  Osteoarthritis.  6.  Diet controlled diabetes.  7.  Status post hysterectomy.  8.  Status post pelvic fracture.  9.  Mild postoperative anemia.  10. Mild hyponatremia resolved at discharge.   PROCEDURE:  1.  On February 23, 2005, the patient underwent revision of right total hip      arthroplasty liner as well as replacement of the femoral head.  2.  Debridement of wear debris from the right total hip arthroplasty and      open reduction and internal fixation of right greater trochanter      fracture using tension banding of the greater trochanter and allograft      bone graft to the greater trochanter fracture site.  This was performed      by Dr. Otelia Sergeant and Maud Deed under general anesthesia.   CONSULTATIONS:  Physical medicine and rehabilitation.   BRIEF HISTORY:  The patient is an 77 year old female status post right total  hip arthroplasty  by Dr. [name] approximately 17 years prior to this admission.  She has been doing well with her hip replacement until the past couple of  weeks at which time she was has had increasing pain to the right side.  No  injuries specifically to the hip but she does have a history of falling a  couple of years ago and having a pubic ramus fracture at that time.  She has  also had a left hip open reduction and internal fixation procedure.  Radiographs demonstrated significant polyethylene wear within the acetabular  liner and the head of the prosthesis nearly subluxed superiorly due to the  thinning of the liner.  Radiographs preoperatively also demonstrated a  hairline fracture of the greater trochanter.  It was felt she would require  surgical intervention and was admitted for the procedures as stated above.   BRIEF HOSPITAL COURSE:  The patient tolerated the procedure under general  anesthesia without complications.  Postoperatively she was placed on  Coumadin for  DVT prophylaxis.  Adjustments in Coumadin dose were made  according to daily protimes by the pharmacist at [facility].  The  patient was started on physical therapy the first postoperative day.  She  was to be weightbearing as tolerated on the operative extremity.  The  patient's right calf was noted to be tender on the first postoperative day.  Compression of the posterior lower leg did cause some discomfort.  However,  she had a negative Homan's sign.  Doppler studies were obtained and she was  negative for DVT.  Dressing changes were done daily and her wound was found  to be healing well.  Hemoglobin and hematocrit were noted to be 9.8 and 29.1  respectively and she did not require blood transfusion.  X-ray of the tibia  and fibula also were performed to rule out fracture.  This was on the right.  She had no fracture noted of the right tibia or fibula.  Pain was controlled  with oral analgesics after her PCA pump was  discontinued.  Due to the fact  that she lives with her sister and a disabled, it was felt she would need  further management at the subacute care unit.  She was quite slow to  progress with her activities secondary to the pain noted in the right lower  extremity.  She was felt to be a suitable candidate for inpatient rehab and  a bed was available on February 26, 2005 at which time she was transferred.  At  that time the patient was afebrile with vital signs stable.  Her wound was  healing well without any signs of infection including drainage, erythema or  edema.   LABORATORY DATA:  Other pertinent laboratory values at discharge.  Her INR  was noted to be 2.  Hemoglobin and hematocrit were noted to be 9.8 and 28.8  respectively on discharge.  She was other to have a WBC of 11.5 at the time  of discharge.  Urinalysis on admission was negative for urinary tract  infection.  Chemistry studies on admission were normal with the exception of  albumin 3.1.  The patient had mildly elevated glucoses throughout the  hospital, ranging from 120 to 157.  She had a value of sodium of 133 on  February 25, 2005 which was noted to be normalized at last BMET at 138.  Cultures taken from the wound intraoperatively showed no growth.  Venous  Doppler studies were negative for DVT.  EKG on admission:  Normal sinus  rhythm with marked sinus arrhythmia and left atrial enlargement.  No  significant change since last tracing read by Dr. Sharyn Lull.  Chest x-ray on  admission:  Normal chest for active disease with chronic changes as before.  Postoperative x-ray of the right hip showed satisfactory postoperative  appearance of the right total hip prosthesis.  AP and lateral of the right  tibia and fibula were negative for fracture.   PLAN:  The patient was transferred to [facility]  subacute care  unit. There she will continue to receive physical therapy for ambulation and gait training.  She is to be  weightbearing as tolerated on the right lower  extremity.  The patient will receive occupational therapy for ADLs.  Dressing changes will be done daily at the rehab center.  She will continue  on Coumadin for DVT prophylaxis.  The list of the patient's medications were  sent with her to the rehab unit for continuation.  After her  discharge to  home, she will follow up with Dr. Otelia Sergeant in his office.  She has been advise  to call [phone] to arrange the appointment.   CONDITION ON DISCHARGE:  Stable.      SMV/MEDQ  D:  04/25/2005  T:  04/25/2005  Job:  [id number]

## 2011-05-06 NOTE — Discharge Summary (Signed)
NAMESHAHIDA, SCHNACKENBERG NO.:  [id number]   MEDICAL RECORD NO.:  [id number]          PATIENT TYPE:  ORB   LOCATION:  4529                         FACILITY:  [facility]   PHYSICIAN:  Ranelle Oyster, M.D.DATE OF BIRTH:  [birth year]   DATE OF ADMISSION:  02/26/2005  DATE OF DISCHARGE:  03/04/2005                                 DISCHARGE SUMMARY   DISCHARGE DIAGNOSES:  1.  Right total hip replacement revision.  2.  Deep venous thrombosis prophylaxis.  3.  History of diabetes mellitus.  4.  History of hypertension.  5.  History of dyslipidemia.  6.  Anemia.  7.  History of urinary incontinence.   HISTORY OF PRESENT ILLNESS:  The patient is an 77 year old white female with  past medical history of diet-controlled diabetes mellitus, dyslipidemia, and  right total hip replacement with right hip pain which failed conservative  care.  Therefore admitted at [facility] from February 23, 2005, for right total hip replacement due to linear erosion by Kerrin Champagne, M.D.  Placed on Coumadin for DVT prophylaxis.  Venous Dopplers were  performed on February 24, 2005, which revealed no obvious DVT or superficial  thrombus or Baker's cyst.   Hospital course significant for tenderness over right fibula and anemia.  X-  ray performed on the right leg tibia which was negative.  PT report at this  time indicates the patient is able to ambulate three steps with a rolling  walker.  She is weightbearing as tolerated.  Bed mobility moderate assist,  transfer moderate assist.   REVIEW OF SYSTEMS:  Significant for incontinence, reflux, and joint swelling  and weakness.   PAST MEDICAL HISTORY:  Significant for diabetes, elevated cholesterol, OA,  hypertension, fibroids, left foot surgery, right total hip replacement in  1988, and hysterectomy.   FAMILY HISTORY:  Noncontributory.   SOCIAL HISTORY:  The patient lives with brother and sister in one-level home  in [place],  [place].  Independent prior to admission.  Two steps to  entry.  Denies any tobacco or alcohol use.  Retired Runner, broadcasting/film/video of high school  math.   FUNCTIONAL HISTORY:  Independent prior to admission.   MEDICATIONS:  1.  Detrol LA 4 mg daily.  2.  Actonel 35 mg q.Friday.  3.  Lipitor 10 mg q.p.m.  4.  Aspirin 325 mg daily.  5.  Zantac.   ALLERGIES:  No known drug allergies.   HOSPITAL COURSE:  Ms. Hlee Fringer was admitted to [facility]acute  Department on February 26, 2005, where she received approximately an hour of  therapies daily.  Ms. Shere progressed very well during her 6-day stay in  subacute.  She was discharged at overall modified independent level.  At the  time of discharge, she was able to ambulate approximately 150 feet with  rolling walker, modified independent level, transfer at modified independent  level with rolling walker, bed mobility minimal assist.  Overall, she is  able to most ADL's at modified independent level.  The patient progressed  very well.  She remained on  Coumadin for DVT prophylaxis.   Hospital course while in subacute significant for constipation and anemia.  The patient was receiving Trinsicon one tablet b.i.d.  Latest hemoglobin was  performed on February 28, 2005, and was 9.6, hematocrit 28.2.  Constipation did  resolve with laxatives and suppositories.  Overall the surgical incision  healed very well and demonstrated no signs of infection.  She was able to  maintain her hip precautions and able to tolerate an hour of therapies.  There were no other major medical issues that occurred while the patient was  in rehab.   LATEST LABS:  Hemoglobin 9.6, hematocrit 28.2, white blood cell count 7.6,  platelet count 342.  Sodium 139, potassium 3.9, chloride 107, CO2 28,  glucose 101, BUN 14, creatinine 0.7.  AST 28, ALT 24, alkaline phosphatase  78.  Urine culture performed on February 26, 2005, greater than 40,000 colonies  Enterococcus species.    Overall she has made great progress.  Diabetes under good control with diet  and blood pressure under good control on low sodium diet.   At the time of discharge, vital signs were stable.  Surgical incision  demonstrated no signs of infection.  She was discharged home with family.   DISCHARGE MEDICATIONS:  1.  Lipitor 20 mg daily.  2.  Detrol LA 40 mg daily.  3.  Resume calcium and Zantac.  4.  Coumadin until March 27, 2005.  5.  Trinsicon one tablet twice daily.  6.  Vicodin 5/500 one or two tablets every 4 to 6 hours as needed.   No aspirin, Aleve, or ibuprofen while on Coumadin.   PAIN MANAGEMENT:  Vicodin.   ACTIVITY:  No driving, drinking alcohol, and no smoking.  Use walker.  [facility] for physical therapy, occupational therapy, and  nurse.  RN to check PT and INR on Monday, March 07, 2005.   DIET:  Diabetic diet.   FOLLOW UP:  Follow up with Kerrin Champagne, M.D. in 2 weeks.  Follow up with  Ranelle Oyster, M.D. as needed.  Follow up with primary care Jacey Eckerson  within 4 weeks.  Dr. Milinda Antis is to check hemoglobin.      LB/MEDQ  D:  03/07/2005  T:  03/07/2005  Job:  [id number]   cc:   Kerrin Champagne, M.D.  [address]  [place]  [place] [address]  Fax: [phone]   Idamae Schuller A. Milinda Antis, M.D. [facility]

## 2011-05-12 ENCOUNTER — Ambulatory Visit
Admission: RE | Admit: 2011-05-12 | Discharge: 2011-05-12 | Disposition: A | Discharge status: Home / Self Care | Payer: Medicare Other | Source: Ambulatory Visit | Attending: Family Medicine | Admitting: Family Medicine

## 2011-05-12 DIAGNOSIS — Z1231 Encounter for screening mammogram for malignant neoplasm of breast: Secondary | ICD-10-CM

## 2011-05-17 ENCOUNTER — Encounter: Payer: Self-pay | Admitting: *Deleted

## 2011-07-06 ENCOUNTER — Other Ambulatory Visit: Payer: Self-pay | Admitting: *Deleted

## 2011-07-06 MED ORDER — TOLTERODINE TARTRATE ER 4 MG PO CP24: 4.0000 mg | ORAL_CAPSULE | Freq: Every day | ORAL | Status: DC

## 2011-07-14 ENCOUNTER — Other Ambulatory Visit: Payer: Self-pay | Admitting: Family Medicine

## 2011-07-14 DIAGNOSIS — E119 Type 2 diabetes mellitus without complications: Secondary | ICD-10-CM

## 2011-07-14 DIAGNOSIS — M810 Age-related osteoporosis without current pathological fracture: Secondary | ICD-10-CM

## 2011-07-14 DIAGNOSIS — E785 Hyperlipidemia, unspecified: Secondary | ICD-10-CM

## 2011-07-20 ENCOUNTER — Other Ambulatory Visit (INDEPENDENT_AMBULATORY_CARE_PROVIDER_SITE_OTHER): Payer: Medicare Other | Admitting: Family Medicine

## 2011-07-20 DIAGNOSIS — E785 Hyperlipidemia, unspecified: Secondary | ICD-10-CM

## 2011-07-20 DIAGNOSIS — M810 Age-related osteoporosis without current pathological fracture: Secondary | ICD-10-CM

## 2011-07-20 DIAGNOSIS — E119 Type 2 diabetes mellitus without complications: Secondary | ICD-10-CM

## 2011-07-20 LAB — ALT: ALT: 12 U/L (ref 0–35)

## 2011-07-20 LAB — RENAL FUNCTION PANEL
Creatinine, Ser: 0.9 mg/dL (ref 0.4–1.2)
GFR: 67.26 mL/min (ref >60.00)
Glucose, Bld: 100 mg/dL — ABNORMAL HIGH (ref 70–99)
Phosphorus: 3.8 mg/dL (ref 2.3–4.6)
Potassium: 4.2 mEq/L (ref 3.5–5.1)
Sodium: 138 mEq/L (ref 135–145)

## 2011-07-20 LAB — TSH: TSH: 3.61 u[IU]/mL (ref 0.35–5.50)

## 2011-07-20 LAB — HEMOGLOBIN A1C: Hgb A1c MFr Bld: 6.8 % — ABNORMAL HIGH (ref 4.6–6.5)

## 2011-07-20 LAB — LIPID PANEL
LDL Cholesterol: 81 mg/dL (ref 0–99)
Total CHOL/HDL Ratio: 3
Triglycerides: 76 mg/dL (ref 0.0–149.0)

## 2011-07-20 LAB — AST: AST: 22 U/L (ref 0–37)

## 2011-07-21 ENCOUNTER — Encounter: Payer: Self-pay | Admitting: Family Medicine

## 2011-07-25 ENCOUNTER — Ambulatory Visit (INDEPENDENT_AMBULATORY_CARE_PROVIDER_SITE_OTHER): Payer: Medicare Other | Admitting: Family Medicine

## 2011-07-25 ENCOUNTER — Encounter: Payer: Self-pay | Admitting: Family Medicine

## 2011-07-25 DIAGNOSIS — E785 Hyperlipidemia, unspecified: Secondary | ICD-10-CM

## 2011-07-25 DIAGNOSIS — E119 Type 2 diabetes mellitus without complications: Secondary | ICD-10-CM

## 2011-07-25 DIAGNOSIS — I10 Essential (primary) hypertension: Secondary | ICD-10-CM

## 2011-07-25 DIAGNOSIS — M1990 Unspecified osteoarthritis, unspecified site: Secondary | ICD-10-CM

## 2011-07-25 NOTE — Assessment & Plan Note (Signed)
Good controlwith lifestyle at this time

## 2011-07-25 NOTE — Assessment & Plan Note (Signed)
Quite stable and well controlled on metformin and diet opthy up to date a1c 6.8 Rev low glycemic diet  F/u 6 mo

## 2011-07-25 NOTE — Progress Notes (Signed)
Subjective:    Patient ID: [name], female    DOB: [birth year], 77 y.o.   MRN: [id number]  HPI Here for f/u of DM and HTN and lipids   Is feeling ok overall   Stays tired  Needs to get up and moving more   Wt is stable   Dm the same with a1c of 6.8- same as last time  opthy 1/12- ok  No vision problems  No thirst or increased urination On metformin  Eats regular meals - good healthy diet    Lipids good with LDL 81 on lipitor and diet  Lab Results  Component Value Date   CHOL 154 07/20/2011   CHOL 144 01/20/2011   CHOL 145 07/08/2010   Lab Results  Component Value Date   HDL 57.70 07/20/2011   HDL 52.10 01/20/2011   HDL 54.10 07/08/2010   Lab Results  Component Value Date   LDLCALC 81 07/20/2011   LDLCALC 78 01/20/2011   LDLCALC 75 07/08/2010   Lab Results  Component Value Date   TRIG 76.0 07/20/2011   TRIG 72.0 01/20/2011   TRIG 80.0 07/08/2010   Lab Results  Component Value Date   CHOLHDL 3 07/20/2011   CHOLHDL 3 01/20/2011   CHOLHDL 3 07/08/2010   No results found for this basename: LDLDIRECT     HTN good control with 106/60 No cp or headache or palpitations  This is lifestyle controlled  Memory is worse Saw neuro - noted dec MMS score- short term  She herself does not notice it  On aricept  May add namenda next time   detrol helps incontinence but does not cure it  Still wets early in am  Needs refil- wants to stick with it   Patient Active Problem List  Diagnoses  . DIABETES MELLITUS, TYPE II  . HYPERLIPIDEMIA  . ESSENTIAL HYPERTENSION, BENIGN  . GERD  . OVERACTIVE BLADDER  . DEGENERATIVE JOINT DISEASE  . OSTEOPOROSIS NOS  . MEMORY LOSS  . INCONTINENCE, URGE   Past Medical History  Diagnosis Date  . Depression     type 2  . Hyperlipidemia   . Osteopenia   . Memory loss, short term 3/09    mild cognitive impairment  . Cataract 2010    bilateral   . DM type 2 (diabetes mellitus, type 2)   . Abnormal CT of the chest 10/08    5 mm nodule-  needs f/u CT in 1 year   Past Surgical History  Procedure Date  . Total hip arthroplasty 3/06    right  . Vaginal hysterectomy   . Ischemic colitis 10/98    colonoscopy  . Squamous cell carcinoma excision 10/2001    on leg  . Pelvic vaginal sling 02/2001    incontinence  . Pelvic fracture surgery 07/2002    after a fall  . Cataract extraction, bilateral 2010  . Dexa 11/01    osteopenia   History  Substance Use Topics  . Smoking status: Never Smoker   . Smokeless tobacco: Not on file  . Alcohol Use: Not on file   Family History  Problem Relation Age of Onset  . Stroke Mother   . Dementia Mother   . Stroke Father   . Breast cancer Sister   . Prostate cancer Brother   . Breast cancer Sister   . Memory loss Sister   . Breast cancer Other     neice  . Alzheimer's disease Sister  older sister  . Memory loss Sister     mild   Allergies  Allergen Reactions  . Alendronate Sodium     REACTION: GI  . Risedronate Sodium     REACTION: GI   Current Outpatient Prescriptions on File Prior to Visit  Medication Sig Dispense Refill  . atorvastatin (LIPITOR) 10 MG tablet Take 10 mg by mouth daily.        . calcitonin, salmon, (MIACALCIN/FORTICAL) 200 UNIT/ACT nasal spray 1 spray by Nasal route daily.        . Calcium Carbonate-Vit D-Min (CALTRATE 600+D PLUS) 600-400 MG-UNIT per tablet Take 1 tablet by mouth 2 (two) times daily.        Marland Kitchen donepezil (ARICEPT) 10 MG tablet Take 10 mg by mouth daily.        Marland Kitchen glucose blood (ONE TOUCH ULTRA TEST) test strip Use to check blood sugar once daily and as directed       . glucose monitoring kit (FREESTYLE) monitoring kit Use to check blood sugar once daily as needed for DM 250.0       . Lancets 28G MISC Use to check blood sugar once a day and as directed       . metFORMIN (GLUCOPHAGE) 500 MG tablet Take 500 mg by mouth 2 (two) times daily with a meal.        . pantoprazole (PROTONIX) 40 MG tablet Take 40 mg by mouth every morning.          . tolterodine (DETROL LA) 4 MG 24 hr capsule Take 1 capsule (4 mg total) by mouth daily.  30 capsule  0      Review of Systems Review of Systems  Constitutional: Negative for fever, appetite change, fatigue and unexpected weight change.  Eyes: Negative for pain and visual disturbance.  Respiratory: Negative for cough and shortness of breath.   Cardiovascular: Negative.for cp or sob    Gastrointestinal: Negative for nausea, diarrhea and constipation.  Genitourinary: Negative for urgency and frequency.  Skin: Negative for pallor. no rash  Neurological: Negative for weakness, light-headedness, numbness and headaches.  Hematological: Negative for adenopathy. Does not bruise/bleed easily.  Psychiatric/Behavioral: Negative for dysphoric mood. The patient is not nervous/anxious.          Objective:   Physical Exam  Constitutional: She appears well-developed and well-nourished. No distress.  HENT:  Head: Normocephalic and atraumatic.  Mouth/Throat: Oropharynx is clear and moist.  Eyes: Conjunctivae and EOM are normal. Pupils are equal, round, and reactive to light.  Neck: Normal range of motion. Neck supple. No JVD present. Carotid bruit is not present. No thyromegaly present.  Cardiovascular: Normal rate, regular rhythm, normal heart sounds and intact distal pulses.   Pulmonary/Chest: Effort normal and breath sounds normal. No respiratory distress. She has no wheezes.  Abdominal: Soft. Bowel sounds are normal. She exhibits no distension. There is no tenderness.  Musculoskeletal: She exhibits no edema and no tenderness.  Lymphadenopathy:    She has no cervical adenopathy.  Neurological: She is alert. She has normal reflexes. Coordination normal.  Skin: Skin is warm and dry. No rash noted. No erythema. No pallor.  Psychiatric: She has a normal mood and affect.       Short term memory loss is not evident in conversation today- but pt relies on her sister to answer some questions            Assessment & Plan:

## 2011-07-25 NOTE — Patient Instructions (Signed)
My goal is to get you up and moving more -- safely  Here is handicapped sticker form Labs are stable Blood pressure and weight are stable  Schedule labs and then follow up in 6 months

## 2011-07-25 NOTE — Assessment & Plan Note (Signed)
Very good control with diet and lipitor Rev labs with pt and her sister  No problems  F/u 6 mo  Disc low sat fat diet

## 2011-07-28 NOTE — Assessment & Plan Note (Signed)
Pt walks with a cane Handicapped sticker form for parking filled out today

## 2011-08-05 ENCOUNTER — Other Ambulatory Visit: Payer: Self-pay

## 2011-08-05 MED ORDER — TOLTERODINE TARTRATE ER 4 MG PO CP24: 4.0000 mg | ORAL_CAPSULE | Freq: Every day | ORAL | Status: DC

## 2011-08-05 MED ORDER — PANTOPRAZOLE SODIUM 40 MG PO TBEC: 40.0000 mg | DELAYED_RELEASE_TABLET | ORAL | Status: DC

## 2011-08-05 NOTE — Telephone Encounter (Signed)
[facility] faxed refill request for Pantoprazole 40 mg and Detrol LA 4 mg. #30 x 3 refills on each.

## 2011-09-27 ENCOUNTER — Ambulatory Visit (INDEPENDENT_AMBULATORY_CARE_PROVIDER_SITE_OTHER): Payer: Medicare Other

## 2011-09-27 DIAGNOSIS — Z23 Encounter for immunization: Secondary | ICD-10-CM

## 2011-11-08 ENCOUNTER — Other Ambulatory Visit: Payer: Self-pay

## 2011-11-08 MED ORDER — METFORMIN HCL 500 MG PO TABS: 500.0000 mg | ORAL_TABLET | Freq: Two times a day (BID) | ORAL | Status: DC

## 2011-11-08 NOTE — Telephone Encounter (Signed)
[facility] faxed refill for Metformin 500 mg # 60 x 4. Pt already scheduled 01/24/12.

## 2011-12-01 ENCOUNTER — Other Ambulatory Visit: Payer: Self-pay | Admitting: *Deleted

## 2011-12-01 MED ORDER — PANTOPRAZOLE SODIUM 40 MG PO TBEC: 40.0000 mg | DELAYED_RELEASE_TABLET | ORAL | Status: DC

## 2011-12-08 ENCOUNTER — Other Ambulatory Visit: Payer: Self-pay | Admitting: *Deleted

## 2011-12-08 MED ORDER — TOLTERODINE TARTRATE ER 4 MG PO CP24: 4.0000 mg | ORAL_CAPSULE | Freq: Every day | ORAL | Status: DC

## 2011-12-09 ENCOUNTER — Telehealth: Payer: Self-pay | Admitting: *Deleted

## 2011-12-09 MED ORDER — PANTOPRAZOLE SODIUM 40 MG PO TBEC: 40.0000 mg | DELAYED_RELEASE_TABLET | ORAL | Status: DC

## 2011-12-09 NOTE — Telephone Encounter (Signed)
rx sent to pharmacy

## 2011-12-14 ENCOUNTER — Telehealth: Payer: Self-pay

## 2011-12-14 NOTE — Telephone Encounter (Signed)
[name] left v/m re prior auth for [employer] pantoprazole. Could not find request. I spoke with Stacie at [facility] and she will fax request to fax at my desk. [name] [phone] notified as instructed. Received request from [facility] and I called [phone] and spoke with [name] and request prior auth. [name] will fax requested info to [phone].

## 2011-12-14 NOTE — Telephone Encounter (Signed)
Prior authorization form received and put on Dr Lucretia Roers shelf in the in basket.

## 2011-12-15 NOTE — Telephone Encounter (Signed)
Pts daughter [name] came by to ck on status of prior auth. I explained the prior auth form did not come until 5:45pm last night via fax and that Dr Milinda Antis had already gone and is in Dr Royden Purl in box to be completed. Dr Milinda Antis is not in the office today and [name] asked to please complete ASAP. I am supposed to call Kinjal Neitzke at [phone] and if I cannot reach Carney Bern call [name] on cell at [phone] when approval from authorization is complete and info has been faxed back to [place].

## 2011-12-16 NOTE — Telephone Encounter (Signed)
completed form faxed to [phone] as instructed Carney Bern notified fax done and will wait to hear approval or denial and then will fax to pharmacy and let family know when completed. Will also send for scanning with letter of approval or denial.

## 2011-12-16 NOTE — Telephone Encounter (Signed)
Letter of approval received and faxed to [facility]. Estill Batten notified as instructed by telephone. All paperwork sent to be scanned.

## 2011-12-16 NOTE — Telephone Encounter (Signed)
This is done and in IN box- please make sure it gets scanned

## 2011-12-26 LAB — HM DIABETES EYE EXAM: HM Diabetic Eye Exam: NORMAL

## 2012-01-05 ENCOUNTER — Other Ambulatory Visit: Payer: Self-pay | Admitting: *Deleted

## 2012-01-05 MED ORDER — TOLTERODINE TARTRATE ER 4 MG PO CP24: 4.0000 mg | ORAL_CAPSULE | Freq: Every day | ORAL | Status: DC

## 2012-01-17 ENCOUNTER — Other Ambulatory Visit (INDEPENDENT_AMBULATORY_CARE_PROVIDER_SITE_OTHER): Payer: Medicare Other

## 2012-01-17 DIAGNOSIS — I10 Essential (primary) hypertension: Secondary | ICD-10-CM

## 2012-01-17 DIAGNOSIS — E119 Type 2 diabetes mellitus without complications: Secondary | ICD-10-CM

## 2012-01-17 LAB — RENAL FUNCTION PANEL
Albumin: 3.7 g/dL (ref 3.5–5.2)
Chloride: 104 mEq/L (ref 96–112)
GFR: 77.62 mL/min (ref >60.00)
Glucose, Bld: 102 mg/dL — ABNORMAL HIGH (ref 70–99)
Phosphorus: 3.5 mg/dL (ref 2.3–4.6)
Potassium: 4.3 mEq/L (ref 3.5–5.1)
Sodium: 140 mEq/L (ref 135–145)

## 2012-01-17 LAB — HEMOGLOBIN A1C: Hgb A1c MFr Bld: 6.5 % (ref 4.6–6.5)

## 2012-01-19 ENCOUNTER — Telehealth: Payer: Self-pay | Admitting: Family Medicine

## 2012-01-19 MED ORDER — ATORVASTATIN CALCIUM 10 MG PO TABS: 10.0000 mg | ORAL_TABLET | Freq: Every day | ORAL | Status: DC

## 2012-01-19 NOTE — Telephone Encounter (Signed)
Lipitor 10 mg #30 x 11 sent electronically to [facility]. April at [facility] did receive refill. Carney Bern notified as instructed by telephone.

## 2012-01-19 NOTE — Telephone Encounter (Signed)
Patient is requesting Lipitor be sent to [facility].  York Spaniel this was second attempt. Please call patient. Thanks, [employer]

## 2012-01-24 ENCOUNTER — Encounter: Payer: Self-pay | Admitting: Family Medicine

## 2012-01-24 ENCOUNTER — Ambulatory Visit (INDEPENDENT_AMBULATORY_CARE_PROVIDER_SITE_OTHER): Payer: Medicare Other | Admitting: Family Medicine

## 2012-01-24 VITALS — BP 120/64 | HR 96 | Temp 97.9°F | Ht 65.0 in | Wt 120.2 lb

## 2012-01-24 DIAGNOSIS — R413 Other amnesia: Secondary | ICD-10-CM

## 2012-01-24 DIAGNOSIS — E119 Type 2 diabetes mellitus without complications: Secondary | ICD-10-CM

## 2012-01-24 DIAGNOSIS — I10 Essential (primary) hypertension: Secondary | ICD-10-CM

## 2012-01-24 MED ORDER — PANTOPRAZOLE SODIUM 40 MG PO TBEC: 40.0000 mg | DELAYED_RELEASE_TABLET | ORAL | Status: DC

## 2012-01-24 MED ORDER — METFORMIN HCL 500 MG PO TABS: 500.0000 mg | ORAL_TABLET | Freq: Two times a day (BID) | ORAL | Status: DC

## 2012-01-24 NOTE — Assessment & Plan Note (Signed)
Very well controlled with metformin and diet  Lab Results  Component Value Date   HGBA1C 6.5 01/17/2012   disc adding 2 protein snacks a day due to weight loss  Urged to stay active  No ace due to bp at this time  Will check microalb at next lab in 6 mo Disc zoster imm

## 2012-01-24 NOTE — Assessment & Plan Note (Signed)
This is lifestyle controlled No problems

## 2012-01-24 NOTE — Patient Instructions (Signed)
You are doing very well  Because of weight loss - I want you to eat 2 small snacks a day with protein- peanut butter or cheeze or lean meat , or protien drink  Stay as active as you can  If the detrol (bladder medicine) is not helping , just stop it  Sugar is in very good control  Continue follow up appts with [name]  If you are interested in a shingles/zoster vaccine - call your insurance to check on coverage,( you should not get it within 1 month of other vaccines) , then call us for a prescription  for it to take to a pharmacy that gives the shot or here  Follow up in 6 months for an annual exam exam with labs prior

## 2012-01-24 NOTE — Progress Notes (Signed)
Subjective:    Patient ID: [name], female    DOB: 05-03-1922, 78 y.o.   MRN: [id number]  HPI Here for f/u of DM and mild HTN and memory loss Doing well overall -pt has no complaints  Her sister had questions about urine leakage -  About the same   Zoster status- may be interested in vaccine- will investigate coverage  bp is 120/64    Today No cp or palpitations or headaches or edema  No medicines - just controlled with diet at this time  DM in very good control with metformin and diet  Wt is down 4 lb with bmi of 20  a1c is 6.5 down from 6.8 Is eating very healthy  Thinks she is eating enough  Eats 3 meals per day  Family notices dec appetite- eats smaller portions   Not on ace due to low bp at times   Sometimes eats nuts    Lipids were good this  summer   Memory loss/ mild cog impairment On aricept -no side effects  She sees [name]  No headaches or symptoms of stroke at all  Memory is decreasing significantly with time - with some slowness in cognition  Patient Active Problem List  Diagnoses  . DIABETES MELLITUS, TYPE II  . HYPERLIPIDEMIA  . ESSENTIAL HYPERTENSION, BENIGN  . GERD  . OVERACTIVE BLADDER  . DEGENERATIVE JOINT DISEASE  . OSTEOPOROSIS NOS  . MEMORY LOSS  . INCONTINENCE, URGE   Past Medical History  Diagnosis Date  . Depression     type 2  . Hyperlipidemia   . Osteopenia   . Memory loss, short term 3/09    mild cognitive impairment  . Cataract 2010    bilateral   . DM type 2 (diabetes mellitus, type 2)   . Abnormal CT of the chest 10/08    5 mm nodule- needs f/u CT in 1 year   Past Surgical History  Procedure Date  . Total hip arthroplasty 3/06    right  . Vaginal hysterectomy   . Ischemic colitis 10/98    colonoscopy  . Squamous cell carcinoma excision 10/2001    on leg  . Pelvic vaginal sling 02/2001    incontinence  . Pelvic fracture surgery 07/2002    after a fall  . Cataract extraction, bilateral 2010  . Dexa 11/01     osteopenia   History  Substance Use Topics  . Smoking status: Never Smoker   . Smokeless tobacco: Not on file  . Alcohol Use: Not on file   Family History  Problem Relation Age of Onset  . Stroke Mother   . Dementia Mother   . Stroke Father   . Breast cancer Sister   . Prostate cancer Brother   . Breast cancer Sister   . Memory loss Sister   . Breast cancer Other     neice  . Alzheimer's disease Sister     older sister  . Memory loss Sister     mild   Allergies  Allergen Reactions  . Alendronate Sodium     REACTION: GI  . Risedronate Sodium     REACTION: GI   Current Outpatient Prescriptions on File Prior to Visit  Medication Sig Dispense Refill  . aspirin 81 MG EC tablet Take 81 mg by mouth daily.        Marland Kitchen atorvastatin (LIPITOR) 10 MG tablet Take 1 tablet (10 mg total) by mouth daily.  30 tablet  11  .  calcitonin, salmon, (MIACALCIN/FORTICAL) 200 UNIT/ACT nasal spray 1 spray by Nasal route daily.        . Calcium Carbonate-Vit D-Min (CALTRATE 600+D PLUS) 600-400 MG-UNIT per tablet Take 1 tablet by mouth 2 (two) times daily.        Marland Kitchen donepezil (ARICEPT) 10 MG tablet Take 10 mg by mouth daily.        Marland Kitchen glucose blood (ONE TOUCH ULTRA TEST) test strip Use to check blood sugar once daily and as directed       . glucose monitoring kit (FREESTYLE) monitoring kit Use to check blood sugar once daily as needed for DM 250.0       . Lancets 28G MISC Use to check blood sugar once a day and as directed       . tolterodine (DETROL LA) 4 MG 24 hr capsule Take 1 capsule (4 mg total) by mouth daily.  30 capsule  3      Review of Systems Review of Systems  Constitutional: Negative for fever, appetite change, fatigue and unexpected weight change.  Eyes: Negative for pain and visual disturbance.  Respiratory: Negative for cough and shortness of breath.   Cardiovascular: Negative for cp or palpitations    Gastrointestinal: Negative for nausea, diarrhea and constipation.   Genitourinary: Negative for urgency and frequency.  Skin: Negative for pallor or rash   Neurological: Negative for weakness, light-headedness, numbness and headaches.  Hematological: Negative for adenopathy. Does not bruise/bleed easily.  Psychiatric/Behavioral: Negative for dysphoric mood. The patient is not nervous/anxious. Pos for significant memory loss and trouble concentrating         Objective:   Physical Exam  Constitutional: She appears well-developed and well-nourished. No distress.       Elderly frail appearing female in chair  HENT:  Head: Normocephalic and atraumatic.  Mouth/Throat: Oropharynx is clear and moist.  Eyes: Conjunctivae and EOM are normal. Pupils are equal, round, and reactive to light. No scleral icterus.  Neck: Normal range of motion. Neck supple. No JVD present. Carotid bruit is not present. No thyromegaly present.  Cardiovascular: Normal rate, regular rhythm, normal heart sounds and intact distal pulses.  Exam reveals no gallop.   Pulmonary/Chest: Effort normal and breath sounds normal. No respiratory distress. She has no wheezes.  Abdominal: Soft. Bowel sounds are normal. She exhibits no distension, no abdominal bruit and no mass. There is no tenderness.  Musculoskeletal: She exhibits no edema and no tenderness.  Lymphadenopathy:    She has no cervical adenopathy.  Neurological: She is alert. She exhibits normal muscle tone. Coordination normal.  Skin: Skin is warm and dry. No rash noted. No erythema. No pallor.  Psychiatric: She has a normal mood and affect.       Content , quiet and timid  Poor memory overall and unable to answer many questions Confuses easily          Assessment & Plan:

## 2012-01-24 NOTE — Assessment & Plan Note (Signed)
This is worsening with time- but pt seems content and in a good mood Will continue aricept and f/u with [name] at this time

## 2012-02-07 ENCOUNTER — Encounter: Payer: Self-pay | Admitting: Family Medicine

## 2012-03-09 ENCOUNTER — Other Ambulatory Visit: Payer: Self-pay

## 2012-03-09 MED ORDER — GLUCOSE BLOOD VI STRP: ORAL_STRIP | Status: DC

## 2012-03-09 NOTE — Telephone Encounter (Signed)
[facility] faxed refill One Touch Ultra test strip # 100 x 3.

## 2012-03-19 ENCOUNTER — Other Ambulatory Visit: Payer: Self-pay | Admitting: *Deleted

## 2012-03-19 MED ORDER — CALCITONIN (SALMON) 200 UNIT/ACT NA SOLN: 1.0000 | Freq: Every day | NASAL | Status: DC

## 2012-04-05 ENCOUNTER — Other Ambulatory Visit: Payer: Self-pay | Admitting: Family Medicine

## 2012-04-05 DIAGNOSIS — Z1231 Encounter for screening mammogram for malignant neoplasm of breast: Secondary | ICD-10-CM

## 2012-05-07 ENCOUNTER — Other Ambulatory Visit: Payer: Self-pay | Admitting: *Deleted

## 2012-05-07 MED ORDER — GLUCOSE BLOOD VI STRP: ORAL_STRIP | Status: DC

## 2012-06-28 ENCOUNTER — Ambulatory Visit: Payer: Medicare Other

## 2012-08-08 ENCOUNTER — Encounter (HOSPITAL_COMMUNITY): Payer: Self-pay | Admitting: *Deleted

## 2012-08-08 ENCOUNTER — Observation Stay (HOSPITAL_COMMUNITY): Payer: Medicare Other

## 2012-08-08 ENCOUNTER — Telehealth: Payer: Self-pay | Admitting: Family Medicine

## 2012-08-08 ENCOUNTER — Observation Stay (HOSPITAL_COMMUNITY)
Admission: EM | Admit: 2012-08-08 | Discharge: 2012-08-10 | Disposition: A | Discharge status: Home / Self Care | Payer: Medicare Other | Attending: Internal Medicine | Admitting: Internal Medicine

## 2012-08-08 ENCOUNTER — Emergency Department (HOSPITAL_COMMUNITY): Payer: Medicare Other

## 2012-08-08 DIAGNOSIS — I10 Essential (primary) hypertension: Secondary | ICD-10-CM

## 2012-08-08 DIAGNOSIS — F3289 Other specified depressive episodes: Secondary | ICD-10-CM | POA: Insufficient documentation

## 2012-08-08 DIAGNOSIS — E785 Hyperlipidemia, unspecified: Secondary | ICD-10-CM

## 2012-08-08 DIAGNOSIS — F0280 Dementia in other diseases classified elsewhere without behavioral disturbance: Secondary | ICD-10-CM | POA: Insufficient documentation

## 2012-08-08 DIAGNOSIS — E119 Type 2 diabetes mellitus without complications: Secondary | ICD-10-CM

## 2012-08-08 DIAGNOSIS — K219 Gastro-esophageal reflux disease without esophagitis: Secondary | ICD-10-CM

## 2012-08-08 DIAGNOSIS — M810 Age-related osteoporosis without current pathological fracture: Secondary | ICD-10-CM

## 2012-08-08 DIAGNOSIS — Z66 Do not resuscitate: Secondary | ICD-10-CM | POA: Insufficient documentation

## 2012-08-08 DIAGNOSIS — R9431 Abnormal electrocardiogram [ECG] [EKG]: Secondary | ICD-10-CM

## 2012-08-08 DIAGNOSIS — G309 Alzheimer's disease, unspecified: Secondary | ICD-10-CM | POA: Insufficient documentation

## 2012-08-08 DIAGNOSIS — F0390 Unspecified dementia without behavioral disturbance: Secondary | ICD-10-CM

## 2012-08-08 DIAGNOSIS — F329 Major depressive disorder, single episode, unspecified: Secondary | ICD-10-CM | POA: Insufficient documentation

## 2012-08-08 DIAGNOSIS — R55 Syncope and collapse: Principal | ICD-10-CM

## 2012-08-08 HISTORY — DX: Squamous cell carcinoma of skin of unspecified lower limb, including hip: C44.721

## 2012-08-08 HISTORY — DX: Alzheimer's disease, unspecified: G30.9

## 2012-08-08 HISTORY — DX: Cardiac arrhythmia, unspecified: I49.9

## 2012-08-08 HISTORY — DX: Gastro-esophageal reflux disease without esophagitis: K21.9

## 2012-08-08 HISTORY — DX: Dementia in other diseases classified elsewhere, unspecified severity, without behavioral disturbance, psychotic disturbance, mood disturbance, and anxiety: F02.80

## 2012-08-08 LAB — CBC WITH DIFFERENTIAL/PLATELET
Basophils Absolute: 0 10*3/uL (ref 0.0–0.1)
Basophils Relative: 0 % (ref 0–1)
Hemoglobin: 12.6 g/dL (ref 12.0–15.0)
Lymphocytes Relative: 8 % — ABNORMAL LOW (ref 12–46)
MCHC: 32.1 g/dL (ref 30.0–36.0)
Monocytes Relative: 7 % (ref 3–12)
Neutro Abs: 9 10*3/uL — ABNORMAL HIGH (ref 1.7–7.7)
Neutrophils Relative %: 84 % — ABNORMAL HIGH (ref 43–77)
WBC: 10.8 10*3/uL — ABNORMAL HIGH (ref 4.0–10.5)

## 2012-08-08 LAB — POCT I-STAT TROPONIN I: Troponin i, poc: 0.01 ng/mL (ref 0.00–0.08)

## 2012-08-08 LAB — CARDIAC PANEL(CRET KIN+CKTOT+MB+TROPI)
CK, MB: 1.9 ng/mL (ref 0.3–4.0)
Total CK: 34 U/L (ref 7–177)
Troponin I: <0.3 ng/mL (ref <0.30)

## 2012-08-08 LAB — POCT I-STAT, CHEM 8
BUN: 21 mg/dL (ref 6–23)
Chloride: 102 mEq/L (ref 96–112)
Glucose, Bld: 153 mg/dL — ABNORMAL HIGH (ref 70–99)
HCT: 40 % (ref 36.0–46.0)
Potassium: 4.3 mEq/L (ref 3.5–5.1)

## 2012-08-08 LAB — GLUCOSE, CAPILLARY: Glucose-Capillary: 138 mg/dL — ABNORMAL HIGH (ref 70–99)

## 2012-08-08 MED ORDER — HYDROMORPHONE HCL PF 1 MG/ML IJ SOLN: 0.5000 mg | INTRAMUSCULAR | Status: DC | PRN

## 2012-08-08 MED ORDER — INSULIN ASPART 100 UNIT/ML ~~LOC~~ SOLN: 0.0000 [IU] | Freq: Every day | SUBCUTANEOUS | Status: DC

## 2012-08-08 MED ORDER — ATORVASTATIN CALCIUM 10 MG PO TABS
10.0000 mg | ORAL_TABLET | Freq: Every day | ORAL | Status: DC
  Administered 2012-08-08 – 2012-08-09 (×2): 10 mg via ORAL
  Filled 2012-08-08 (×3): qty 1

## 2012-08-08 MED ORDER — ASPIRIN 81 MG PO TBEC
81.0000 mg | DELAYED_RELEASE_TABLET | Freq: Every day | ORAL | Status: DC
  Administered 2012-08-08 – 2012-08-09 (×2): 81 mg via ORAL
  Filled 2012-08-08 (×3): qty 1

## 2012-08-08 MED ORDER — DONEPEZIL HCL 10 MG PO TABS
10.0000 mg | ORAL_TABLET | Freq: Every day | ORAL | Status: DC
  Administered 2012-08-08 – 2012-08-09 (×2): 10 mg via ORAL
  Filled 2012-08-08 (×3): qty 1

## 2012-08-08 MED ORDER — ALUM & MAG HYDROXIDE-SIMETH 200-200-20 MG/5ML PO SUSP: 30.0000 mL | Freq: Four times a day (QID) | ORAL | Status: DC | PRN

## 2012-08-08 MED ORDER — FESOTERODINE FUMARATE ER 8 MG PO TB24
8.0000 mg | ORAL_TABLET | Freq: Every day | ORAL | Status: DC
  Administered 2012-08-09: 8 mg via ORAL
  Filled 2012-08-08 (×4): qty 1

## 2012-08-08 MED ORDER — SODIUM CHLORIDE 0.9 % IJ SOLN: 3.0000 mL | Freq: Two times a day (BID) | INTRAMUSCULAR | Status: DC

## 2012-08-08 MED ORDER — CALTRATE 600+D PLUS 600-400 MG-UNIT PO TABS
1.0000 | ORAL_TABLET | Freq: Two times a day (BID) | ORAL | Status: DC
  Administered 2012-08-08: 1 via ORAL

## 2012-08-08 MED ORDER — INSULIN ASPART 100 UNIT/ML ~~LOC~~ SOLN: 0.0000 [IU] | Freq: Three times a day (TID) | SUBCUTANEOUS | Status: DC

## 2012-08-08 MED ORDER — PANTOPRAZOLE SODIUM 40 MG PO TBEC
40.0000 mg | DELAYED_RELEASE_TABLET | ORAL | Status: DC
  Administered 2012-08-09 – 2012-08-10 (×2): 40 mg via ORAL
  Filled 2012-08-08 (×2): qty 1

## 2012-08-08 MED ORDER — MEMANTINE HCL 10 MG PO TABS
10.0000 mg | ORAL_TABLET | Freq: Two times a day (BID) | ORAL | Status: DC
  Administered 2012-08-08 – 2012-08-09 (×4): 10 mg via ORAL
  Filled 2012-08-08 (×6): qty 1

## 2012-08-08 MED ORDER — CALCITONIN (SALMON) 200 UNIT/ACT NA SOLN
1.0000 | Freq: Every day | NASAL | Status: DC
  Administered 2012-08-08 – 2012-08-09 (×2): 1 via NASAL
  Filled 2012-08-08: qty 3.7

## 2012-08-08 MED ORDER — ACETAMINOPHEN 650 MG RE SUPP: 650.0000 mg | Freq: Four times a day (QID) | RECTAL | Status: DC | PRN

## 2012-08-08 MED ORDER — CALCIUM CARBONATE-VITAMIN D 500-200 MG-UNIT PO TABS
1.0000 | ORAL_TABLET | Freq: Two times a day (BID) | ORAL | Status: DC
  Administered 2012-08-08 – 2012-08-09 (×4): 1 via ORAL
  Filled 2012-08-08 (×6): qty 1

## 2012-08-08 MED ORDER — HYDROCODONE-ACETAMINOPHEN 5-325 MG PO TABS: 1.0000 | ORAL_TABLET | Freq: Four times a day (QID) | ORAL | Status: DC | PRN

## 2012-08-08 MED ORDER — SODIUM CHLORIDE 0.9 % IV SOLN
INTRAVENOUS | Status: DC
  Administered 2012-08-08: 16:00:00 via INTRAVENOUS
  Administered 2012-08-09: 3 mL via INTRAVENOUS
  Administered 2012-08-09 – 2012-08-10 (×2): via INTRAVENOUS

## 2012-08-08 MED ORDER — ONDANSETRON HCL 4 MG/2ML IJ SOLN: 4.0000 mg | Freq: Four times a day (QID) | INTRAMUSCULAR | Status: DC | PRN

## 2012-08-08 MED ORDER — ONDANSETRON HCL 4 MG/2ML IJ SOLN: 4.0000 mg | Freq: Three times a day (TID) | INTRAMUSCULAR | Status: DC | PRN

## 2012-08-08 MED ORDER — ONDANSETRON HCL 4 MG PO TABS: 4.0000 mg | ORAL_TABLET | Freq: Four times a day (QID) | ORAL | Status: DC | PRN

## 2012-08-08 MED ORDER — ACETAMINOPHEN 325 MG PO TABS: 650.0000 mg | ORAL_TABLET | Freq: Four times a day (QID) | ORAL | Status: DC | PRN

## 2012-08-08 NOTE — Telephone Encounter (Signed)
Thanks for the update - I will follow her documents in the computer- read her H and P and they are keeping her in the hospital for now

## 2012-08-08 NOTE — Progress Notes (Signed)
PT Cancellation Note     Treatment cancelled today due to as were wer getting started she has to go to CT scan. 08/08/2012  [place] Bing, PT [phone] (pager)

## 2012-08-08 NOTE — ED Notes (Signed)
MD at bedside. 

## 2012-08-08 NOTE — H&P (Signed)
History and Physical       Hospital Admission Note Date: 08/08/2012  Patient name: [name] Medical record number: [id number] Date of birth: [birth year] Age: 78 y.o. Gender: female PCP: Roxy Manns, MD    Chief Complaint:  Syncopal episode today  HPI: Patient is 78 year old female with history of Alzheimer's dementia, depression, diabetes, hyperlipidemia, GERD, osteoporosis was brought to [facility] after a syncopal episode. Patient has dementia his history was obtained from the patient's sister who lives with her and and patient's niece. Per the patient's sister, she woke up at 7 AM today for a dental appointment at 9:30am  And was sitting at the dining table eating her breakfast when patient spilled her coffee. Patient's sister noticed that patient had not been aware of her coffee spilling and her "eyes fluttered then closed". She called EMS and with the help of a caregiver, assisted the patient to the floor.  Otherwise patient sister did not report any complaints from the patient about having any chest pain, shortness of breath, nausea vomiting or any diarrhea. She had been running no fever or chills, had been eating fine prior to this episode.  ED work-up; CBC and BMET were essentially unremarkable EKG showed T wave inversions in V2 and V3 otherwise similar to the previous EKG in March 2006. Chest x-ray was unremarkable, no other imaging was present.  Review of Systems:  Unable to obtain from the patient due to her dementia  Past Medical History: Past Medical History  Diagnosis Date  . Depression     type 2  . Hyperlipidemia   . Osteopenia   . Memory loss, short term 3/09    mild cognitive impairment  . Cataract 2010    bilateral   . DM type 2 (diabetes mellitus, type 2)   . Abnormal CT of the chest 10/08    5 mm nodule- needs f/u CT in 1 year  . Alzheimer disease    Past Surgical History  Procedure Date  . Total hip  arthroplasty 3/06    right  . Vaginal hysterectomy   . Ischemic colitis 10/98    colonoscopy  . Squamous cell carcinoma excision 10/2001    on leg  . Pelvic vaginal sling 02/2001    incontinence  . Pelvic fracture surgery 07/2002    after a fall  . Cataract extraction, bilateral 2010  . Dexa 11/01    osteopenia    Medications: Prior to Admission medications   Medication Sig Start Date End Date Taking? Authorizing Provider  aspirin 81 MG EC tablet Take 81 mg by mouth daily.     Yes Historical Provider, MD  atorvastatin (LIPITOR) 10 MG tablet Take 10 mg by mouth daily. 01/19/12  Yes Judy Pimple, MD  calcitonin, salmon, (MIACALCIN/FORTICAL) 200 UNIT/ACT nasal spray Place 1 spray into the nose daily. 03/19/12  Yes Judy Pimple, MD  Calcium Carbonate-Vit D-Min (CALTRATE 600+D PLUS) 600-400 MG-UNIT per tablet Take 1 tablet by mouth 2 (two) times daily.     Yes Historical Provider, MD  cephALEXin (KEFLEX) 500 MG capsule Take 500 mg by mouth daily as needed. Takes before dental appointments   Yes Historical Provider, MD  donepezil (ARICEPT) 10 MG tablet Take 10 mg by mouth daily.     Yes Historical Provider, MD  memantine (NAMENDA) 10 MG tablet Take 10 mg by mouth 2 (two) times daily.   Yes Historical Provider, MD  metFORMIN (GLUCOPHAGE) 500 MG tablet Take 1 tablet (500 mg total) by  mouth 2 (two) times daily with a meal. 01/24/12  Yes Judy Pimple, MD  pantoprazole (PROTONIX) 40 MG tablet Take 1 tablet (40 mg total) by mouth every morning. 01/24/12  Yes Judy Pimple, MD  glucose blood (ONE TOUCH ULTRA TEST) test strip Use to check blood sugar once daily and as directed 05/07/12   Judy Pimple, MD  glucose monitoring kit (FREESTYLE) monitoring kit Use to check blood sugar once daily as needed for DM 250.0     Historical Provider, MD  Lancets 28G MISC Use to check blood sugar once a day and as directed     Historical Provider, MD  tolterodine (DETROL LA) 4 MG 24 hr capsule Take 1 capsule (4 mg  total) by mouth daily. 01/05/12   Karie Schwalbe, MD    Allergies:   Allergies  Allergen Reactions  . Alendronate Sodium     REACTION: GI  . Risedronate Sodium     REACTION: GI    Social History:  reports that she has never smoked. She does not have any smokeless tobacco history on file. She reports that she does not drink alcohol. Her drug history not on file. Patient lives at home with 2 of her sisters, one of the other sister has dementia as well. They have a caregiver at home.   Family History: Family History  Problem Relation Age of Onset  . Stroke Mother   . Dementia Mother   . Stroke Father   . Breast cancer Sister   . Prostate cancer Brother   . Breast cancer Sister   . Memory loss Sister   . Breast cancer Other     neice  . Alzheimer's disease Sister     older sister  . Memory loss Sister     mild    Physical Exam: Blood pressure 157/57, pulse 68, temperature 97.5 F (36.4 C), temperature source Oral, resp. rate 17, height 5\' 3"  (1.6 m), weight 56.7 kg (125 lb), SpO2 94.00%. General: Alert, awake, oriented x2, in no acute distress. HEENT: normocephalic, atraumatic, anicteric sclera, pink conjunctiva, pupils equal and reactive to light and accomodation, oropharynx clear Neck: supple, no masses or lymphadenopathy, no goiter, no bruits  Heart: Regular rate and rhythm, without murmurs, rubs or gallops. Lungs: Clear to auscultation bilaterally, no wheezing, rales or rhonchi. Abdomen: Soft, nontender, nondistended, positive bowel sounds, no masses. Extremities: No clubbing, cyanosis or edema with positive pedal pulses. Neuro: Grossly intact, no focal neurological deficits, strength 5/5 upper and lower extremities bilaterally Psych: alert and oriented x 3, normal mood and affect Skin: no rashes or lesions, warm and dry   LABS on Admission:  Basic Metabolic Panel:  Lab 08/08/12 1610  NA 139  K 4.3  CL 102  CO2 --  GLUCOSE 153*  BUN 21  CREATININE 0.90    CALCIUM --  MG --  PHOS --   CBC:  Lab 08/08/12 1043 08/08/12 1013  WBC -- 10.8*  NEUTROABS -- 9.0*  HGB 13.6 12.6  HCT 40.0 39.2  MCV -- 89.9  PLT -- 291     Radiological Exams on Admission: Dg Chest Portable 1 View  08/08/2012  *RADIOLOGY REPORT*  Clinical Data: Near syncope.  PORTABLE CHEST - 1 VIEW  Comparison: 10/01/2007 CT.  02/21/2005 chest x-ray.  Findings: Left base scarring.  The CT detected left lower lobe 5 mm nodule is not adequately assessed by plain film exam.  No infiltrate, congestive heart failure or pneumothorax.  Central pulmonary  vascular prominence stable.  Cardiomegaly.  Calcified mildly tortuous aorta.  IMPRESSION: Left base scarring.  The CT detected left lower lobe 5 mm nodule is not adequately assessed by plain film exam.  No infiltrate, congestive heart failure or pneumothorax.  Central pulmonary vascular prominence stable.  Cardiomegaly.  Calcified mildly tortuous aorta.   Original Report Authenticated By: Fuller Canada, M.D.     Assessment/Plan  Principal problem:   .Syncope: No clear etiology, possibly vasovagal, however patient has subtle changes on her EKG. Slight leukocytosis, rule out infectious etiology/ UTI or CVA. EKG showed rate of 70, NSR, TWI in V2-V3   - Admit under to observation, telemetry, serial cardiac enzymes, CT head without contrast to rule out any CVA, UA to rule out UTI - Continue gentle hydration, orthostatics vitals. 2-D echocardiogram given cardiomegaly and subtle EKG changes.   Marland KitchenDIABETES MELLITUS, TYPE II - Obtain hemoglobin A1c, place on sliding scale insulin   .HYPERLIPIDEMIA: - Continue statin   .Essential hypertension, benign: Not on any antihypertensives at home   .GERD: Continue PPI   .Dementia: Continue Namenda and Aricept   DVT prophylaxis: SCD's  CODE STATUS: DNR  Further plan will depend as patient's clinical course evolves and further radiologic and laboratory data become available.   Time Spent on  Admission: 1 hour  RAI,RIPUDEEP M.D. [facility] 08/08/2012, 12:24 PM Pager: [phone]  If 7PM-7AM, please contact night-coverage [email or web address] Password [email or web address]

## 2012-08-08 NOTE — ED Provider Notes (Signed)
History     CSN: [id number]  Arrival date & time 08/08/12  7829   First MD Initiated Contact with Patient 08/08/12 [phone]      Chief Complaint  Patient presents with  . Near Syncope     HPI Pt here per EMS and family member. Family member reports pt was at breakfast when she spilled her coffee and had an episode described as "eyes fluttered and then closed " they assisted her to the floor and she was "awake but not talking for a few minutes". They called 911, [facility] arrived pt awake and responding, IV started, O2 applied and pt transported  Past Medical History  Diagnosis Date  . Depression     type 2  . Hyperlipidemia   . Osteopenia   . Memory loss, short term 3/09    mild cognitive impairment  . Abnormal CT of the chest 10/08    5 mm nodule- needs f/u CT in 1 year  . Squamous cell carcinoma of leg   . DM type 2 (diabetes mellitus, type 2)   . Dysrhythmia     "irregular at times"  . GERD (gastroesophageal reflux disease)   . Alzheimer disease     "severe" (08/08/2012)    Past Surgical History  Procedure Date  . Total hip arthroplasty ? first OR; 02/2005    right X 2  . Ischemic colitis 10/98    colonoscopy  . Squamous cell carcinoma excision 10/2001    on leg  . Pelvic fracture surgery 07/2002    after a fall  . Cataract extraction, bilateral 2010  . Dexa 11/01    osteopenia  . Vaginal hysterectomy 1972  . Bladder suspension 02/2001    pelvic vaginal sling for incontinence    Family History  Problem Relation Age of Onset  . Stroke Mother   . Dementia Mother   . Stroke Father   . Breast cancer Sister   . Prostate cancer Brother   . Breast cancer Sister   . Memory loss Sister   . Breast cancer Other     neice  . Alzheimer's disease Sister     older sister  . Memory loss Sister     mild    History  Substance Use Topics  . Smoking status: Never Smoker   . Smokeless tobacco: Never Used  . Alcohol Use: No    OB History    Grav Para Term Preterm  Abortions TAB SAB Ect Mult Living                  Review of Systems  All other systems reviewed and are negative.    Allergies  Alendronate sodium and Risedronate sodium  Home Medications   Current Outpatient Rx  Name Route Sig Dispense Refill  . ASPIRIN 81 MG PO TBEC Oral Take 81 mg by mouth daily.      . ATORVASTATIN CALCIUM 10 MG PO TABS Oral Take 10 mg by mouth daily.    Marland Kitchen CALCITONIN (SALMON) 200 UNIT/ACT NA SOLN Nasal Place 1 spray into the nose daily. 3.7 mL 3  . CALTRATE 600+D PLUS 600-400 MG-UNIT PO TABS Oral Take 1 tablet by mouth 2 (two) times daily.      . DONEPEZIL HCL 10 MG PO TABS Oral Take 10 mg by mouth daily.      Marland Kitchen MEMANTINE HCL 10 MG PO TABS Oral Take 10 mg by mouth 2 (two) times daily.    Marland Kitchen METFORMIN HCL 500 MG  PO TABS Oral Take 1 tablet (500 mg total) by mouth 2 (two) times daily with a meal. 60 tablet 11  . PANTOPRAZOLE SODIUM 40 MG PO TBEC Oral Take 1 tablet (40 mg total) by mouth every morning. 30 tablet 11  . AMLODIPINE BESYLATE 5 MG PO TABS Oral Take 1 tablet (5 mg total) by mouth daily. 30 tablet 0  . GLUCOSE BLOOD VI STRP  Use to check blood sugar once daily and as directed 100 each 11  . FREESTYLE SYSTEM KIT  Use to check blood sugar once daily as needed for DM 250.0     . LANCETS 28G MISC  Use to check blood sugar once a day and as directed     . TOLTERODINE TARTRATE ER 4 MG PO CP24 Oral Take 1 capsule (4 mg total) by mouth daily. 30 capsule 3    BP 170/67  Pulse 71  Temp 97.7 F (36.5 C) (Oral)  Resp 18  Ht 5\' 3"  (1.6 m)  Wt 125 lb (56.7 kg)  BMI 22.14 kg/m2  SpO2 98%  Physical Exam  Nursing note and vitals reviewed. Constitutional: She is oriented to person, place, and time. She appears well-developed. No distress.  HENT:  Head: Normocephalic and atraumatic.  Eyes: Pupils are equal, round, and reactive to light.  Neck: Normal range of motion.  Cardiovascular: Normal rate and intact distal pulses.         Date: 08/08/2012  Rate: 70   Rhythm: normal sinus rhythm  QRS Axis: normal  Intervals: normal  ST/T Wave abnormalities: normal  Conduction Disutrbances: none  Narrative Interpretation: unremarkable      Pulmonary/Chest: No respiratory distress.  Abdominal: Normal appearance. She exhibits no distension.  Musculoskeletal: Normal range of motion.  Neurological: She is alert and oriented to person, place, and time. No cranial nerve deficit.  Skin: Skin is warm and dry. No rash noted.  Psychiatric: She has a normal mood and affect. Her behavior is normal.    ED Course  Procedures (including critical care time)  Labs Reviewed  CBC WITH DIFFERENTIAL - Abnormal; Notable for the following:    WBC 10.8 (*)     Neutrophils Relative 84 (*)     Neutro Abs 9.0 (*)     Lymphocytes Relative 8 (*)     All other components within normal limits  POCT I-STAT, CHEM 8 - Abnormal; Notable for the following:    Glucose, Bld 153 (*)     All other components within normal limits  HEMOGLOBIN A1C - Abnormal; Notable for the following:    Hemoglobin A1C 6.5 (*)     Mean Plasma Glucose 140 (*)     All other components within normal limits  URINALYSIS, ROUTINE W REFLEX MICROSCOPIC - Abnormal; Notable for the following:    Leukocytes, UA MODERATE (*)     All other components within normal limits  GLUCOSE, CAPILLARY - Abnormal; Notable for the following:    Glucose-Capillary 138 (*)     All other components within normal limits  BASIC METABOLIC PANEL - Abnormal; Notable for the following:    GFR calc non Af Amer 78 (*)     GFR calc Af Amer 90 (*)     All other components within normal limits  CBC - Abnormal; Notable for the following:    Hemoglobin 11.2 (*)  DELTA CHECK NOTED   HCT 35.0 (*)     All other components within normal limits  GLUCOSE, CAPILLARY - Abnormal; Notable for  the following:    Glucose-Capillary 101 (*)     All other components within normal limits  GLUCOSE, CAPILLARY - Abnormal; Notable for the following:     Glucose-Capillary 120 (*)     All other components within normal limits  GLUCOSE, CAPILLARY - Abnormal; Notable for the following:    Glucose-Capillary 113 (*)     All other components within normal limits  GLUCOSE, CAPILLARY - Abnormal; Notable for the following:    Glucose-Capillary 122 (*)     All other components within normal limits  GLUCOSE, CAPILLARY - Abnormal; Notable for the following:    Glucose-Capillary 185 (*)     All other components within normal limits  POCT I-STAT TROPONIN I  CARDIAC PANEL(CRET KIN+CKTOT+MB+TROPI)  CARDIAC PANEL(CRET KIN+CKTOT+MB+TROPI)  CARDIAC PANEL(CRET KIN+CKTOT+MB+TROPI)  URINE CULTURE  URINE MICROSCOPIC-ADD ON  GLUCOSE, CAPILLARY  GLUCOSE, CAPILLARY  LAB REPORT - SCANNED   No results found.  CT HEAD WITHOUT CONTRAST  Technique: Contiguous axial images were obtained from the base of the skull through the vertex without contrast.  Comparison: None.  Findings: Advanced atrophy. Chronic microvascular ischemic changes in the white matter. No acute infarct. Negative for hemorrhage or mass. Calvarium intact.  IMPRESSION: Advanced atrophy. Chronic microvascular ischemic changes without acute abnormality.     1. Syncope   2. Abnormal EKG   3. Dementia   4. Esophageal reflux   5. Essential hypertension, benign   6. Osteoporosis, unspecified   7. Other and unspecified hyperlipidemia   8. Type II or unspecified type diabetes mellitus without mention of complication, not stated as uncontrolled       MDM         Nelia Shi, MD 08/14/12 2229

## 2012-08-08 NOTE — Telephone Encounter (Signed)
Message copied by Judy Pimple on Wed Aug 08, 2012  5:44 PM ------      Message from: Alvina Chou      Created: Fri Aug 03, 2012 12:41 PM      Regarding: Lab orders for Thursday, 8.22.13       Patient is scheduled for CPX labs, please order future labs, Thanks , Camelia Eng

## 2012-08-08 NOTE — Telephone Encounter (Signed)
[name]- she was adm to the hosp today for syncope so she will not be in for labs tomorrow -- I just found out, thanks

## 2012-08-08 NOTE — Progress Notes (Signed)
Observation review is complete. 

## 2012-08-08 NOTE — Telephone Encounter (Signed)
Patient went to [facility] this morning.  Patient passed out while eating breakfast.  She was unresponsive for 2 minutes.  She had an irregular heartbeat.  They admitted her to the Hospital.

## 2012-08-08 NOTE — ED Notes (Signed)
Pt here per EMS and family member. Family member reports pt was at breakfast when she spilled her coffee and had an episode described as "eyes fluttered and then closed " they assisted her to the floor and she was "awake but not talking for a few minutes". They called 911, [facility] arrived pt awake and responding, IV started, O2 applied and pt transported.

## 2012-08-09 ENCOUNTER — Other Ambulatory Visit: Payer: Medicare Other

## 2012-08-09 DIAGNOSIS — I059 Rheumatic mitral valve disease, unspecified: Secondary | ICD-10-CM

## 2012-08-09 LAB — HEMOGLOBIN A1C
Hgb A1c MFr Bld: 6.5 % — ABNORMAL HIGH (ref <5.7)
Mean Plasma Glucose: 140 mg/dL — ABNORMAL HIGH (ref <117)

## 2012-08-09 LAB — URINE MICROSCOPIC-ADD ON

## 2012-08-09 LAB — URINALYSIS, ROUTINE W REFLEX MICROSCOPIC
Glucose, UA: NEGATIVE mg/dL
Hgb urine dipstick: NEGATIVE
Protein, ur: NEGATIVE mg/dL
Specific Gravity, Urine: 1.006 (ref 1.005–1.030)
Urobilinogen, UA: 0.2 mg/dL (ref 0.0–1.0)

## 2012-08-09 LAB — BASIC METABOLIC PANEL
CO2: 27 mEq/L (ref 19–32)
Calcium: 9.7 mg/dL (ref 8.4–10.5)
Chloride: 107 mEq/L (ref 96–112)
Creatinine, Ser: 0.65 mg/dL (ref 0.50–1.10)
GFR calc Af Amer: 90 mL/min — ABNORMAL LOW (ref >90)
Sodium: 142 mEq/L (ref 135–145)

## 2012-08-09 LAB — CBC
MCV: 88.6 fL (ref 78.0–100.0)
Platelets: 291 10*3/uL (ref 150–400)
RBC: 3.95 MIL/uL (ref 3.87–5.11)
RDW: 13.3 % (ref 11.5–15.5)
WBC: 8.4 10*3/uL (ref 4.0–10.5)

## 2012-08-09 LAB — GLUCOSE, CAPILLARY: Glucose-Capillary: 120 mg/dL — ABNORMAL HIGH (ref 70–99)

## 2012-08-09 LAB — CARDIAC PANEL(CRET KIN+CKTOT+MB+TROPI)
CK, MB: 2.3 ng/mL (ref 0.3–4.0)
Total CK: 81 U/L (ref 7–177)
Troponin I: <0.3 ng/mL (ref <0.30)

## 2012-08-09 NOTE — Evaluation (Signed)
Physical Therapy Evaluation Patient Details Name: [name] MRN: [id number] DOB: 04/07/1922 Today's Date: 08/09/2012 Time: 7829-5621 PT Time Calculation (min): 39 min  PT Assessment / Plan / Recommendation Clinical Impression  pt presents with Syncope and history of Dementia.  pthas good support from sister and CGs at home.      PT Assessment  Patient needs continued PT services    Follow Up Recommendations  Supervision/Assistance - 24 hour;No PT follow up    Barriers to Discharge None      Equipment Recommendations  None recommended by PT    Recommendations for Other Services     Frequency Min 3X/week    Precautions / Restrictions Precautions Precautions: Fall Restrictions Weight Bearing Restrictions: No   Pertinent Vitals/Pain Denies pain.        Mobility  Bed Mobility Bed Mobility: Supine to Sit;Sitting - Scoot to Edge of Bed;Sit to Supine Supine to Sit: 5: Supervision Sitting - Scoot to Edge of Bed: 5: Supervision Sit to Supine: 5: Supervision Details for Bed Mobility Assistance: pt moves slowly and needs cues for encouragement.   Transfers Transfers: Sit to Stand;Stand to Sit Sit to Stand: 4: Min guard;With upper extremity assist;From bed Stand to Sit: 4: Min guard;With upper extremity assist;To bed Details for Transfer Assistance: cues for UE use and for positioning prior to sitting.   Ambulation/Gait Ambulation/Gait Assistance: 4: Min guard Ambulation Distance (Feet): 180 Feet Assistive device: Rolling walker Ambulation/Gait Assistance Details: pt with flexed posture and cues to stay closer to RW.   Gait Pattern: Step-through pattern;Decreased stride length;Trunk flexed Stairs: No Wheelchair Mobility Wheelchair Mobility: No    Exercises     PT Diagnosis: Difficulty walking  PT Problem List: Decreased strength;Decreased activity tolerance;Decreased balance;Decreased mobility;Decreased knowledge of use of DME PT Treatment Interventions: DME  instruction;Gait training;Stair training;Functional mobility training;Therapeutic activities;Therapeutic exercise;Balance training;Patient/family education   PT Goals Acute Rehab PT Goals PT Goal Formulation: With patient/family Time For Goal Achievement: 08/23/12 Potential to Achieve Goals: Good Pt will go Sit to Stand: with modified independence PT Goal: Sit to Stand - Progress: Goal set today Pt will go Stand to Sit: with modified independence PT Goal: Stand to Sit - Progress: Goal set today Pt will Ambulate: >150 feet;with supervision;with rolling walker PT Goal: Ambulate - Progress: Goal set today Pt will Go Up / Down Stairs: 3-5 stairs;with min assist;with rail(s) PT Goal: Up/Down Stairs - Progress: Goal set today  Visit Information  Last PT Received On: 08/09/12 Assistance Needed: +1    Subjective Data  Subjective: I'd like a million dollars!   Patient Stated Goal: Per sister goal is to return home.     Prior Functioning  Home Living Lives With:  (One sister is CG. a second sister has dementia) Available Help at Discharge: Family;Personal care attendant;Available 24 hours/day Type of Home: House Home Access: Stairs to enter [employer] of Steps: 2 Home Layout: One level Bathroom Shower/Tub: Health visitor: Standard (with 3-in-1 over top) Bathroom Accessibility: Yes How Accessible: Accessible via walker Home Adaptive Equipment: Bedside commode/3-in-1;Grab bars in shower;Grab bars around toilet;Hand-held shower hose;Shower chair with back;Tub transfer bench;Walker - rolling;Straight cane Prior Function Level of Independence: Needs assistance Needs Assistance: Bathing;Dressing;Toileting;Meal Prep;Light Housekeeping;Gait;Transfers Bath: Minimal Dressing: Minimal Toileting: Moderate Meal Prep: Total Light Housekeeping: Total Gait Assistance: Uses a cane and needs A outside and on stairs Transfer Assistance: S in home, but A needed for car  transfer Able to Take Stairs?: Yes Driving: No Vocation: Retired Comments: pt  is retired Editor, commissioning.  Per sister pt and other sister with dementia have 24/7 caregivers except for 5 hours on Mondays when it's just the sisters at home.   Communication Communication: No difficulties    Cognition  Overall Cognitive Status: History of cognitive impairments - at baseline Arousal/Alertness: Awake/alert Orientation Level: Disoriented to;Place;Time;Situation Behavior During Session: [facility] for tasks performed Cognition - Other Comments: pt with Alzheimer's at baseline.      Extremity/Trunk Assessment Right Lower Extremity Assessment RLE ROM/Strength/Tone: Deficits RLE ROM/Strength/Tone Deficits: Globally weak 4/5 Left Lower Extremity Assessment LLE ROM/Strength/Tone: Deficits LLE ROM/Strength/Tone Deficits: Globally weak, 4/5 Trunk Assessment Trunk Assessment: Kyphotic   Balance Balance Balance Assessed: No  End of Session PT - End of Session Equipment Utilized During Treatment: Gait belt Activity Tolerance: Patient tolerated treatment well Patient left: in bed;with call bell/phone within reach;with family/visitor present Nurse Communication: Mobility status  GP Functional Assessment Tool Used: Clinical Judgement Functional Limitation: Mobility: Walking and moving around Mobility: Walking and Moving Around Current Status ([id number]): At least 1 percent but less than 20 percent impaired, limited or restricted Mobility: Walking and Moving Around Goal Status [phone]): 0 percent impaired, limited or restricted   [name], [place] [phone] 08/09/2012, 2:55 PM

## 2012-08-09 NOTE — Progress Notes (Signed)
[facility] PROGRESS NOTE  [name]:[id number] DOB: 12-21-1921 DOA: 08/08/2012 PCP: Roxy Manns, MD  Assessment/Plan: Principal Problem:  *Syncope Active Problems:  DIABETES MELLITUS, TYPE II  HYPERLIPIDEMIA  Essential hypertension, benign  GERD  Dementia  Abnormal EKG  .Syncope: No clear etiology, possibly vasovagal, however patient has subtle changes on her EKG. Slight leukocytosis on admission,  Infectious etiology ruled out with negative UA and CXR. CT head without contrast negative for acute CVA.   EKG showed rate of 70, NSR, TWI in V2-V3 on admission. Will repeat EKG today.  - Admitted under to observation, telemetry, serial cardiac enzymes negative so far - Continue gentle hydration, orthostatics vitals. 2-D echocardiogram given cardiomegaly and subtle EKG changes.    Marland KitchenDIABETES MELLITUS, TYPE II  -  hemoglobin A1c is 6.5%, place on sliding scale insulin . CBG (last 3)   Basename 08/09/12 1147 08/09/12 0557 08/08/12 2218  GLUCAP 120* 93 101*      .HYPERLIPIDEMIA:  - Continue statin   .Essential hypertension, benign: Not on any antihypertensives at home   .GERD: Continue PPI   .Dementia: Continue Namenda and Aricept   DVT prophylaxis: SCD's  CODE STATUS: DNR      Code Status: DNR Family Communication: none at bedside Disposition Plan: pending PT/OT evaluation.    Brief narrative:  Patient is 78 year old female with history of Alzheimer's dementia, depression, diabetes, hyperlipidemia, GERD, osteoporosis was brought to [facility] after a syncopal episode  Consultants:  none  Procedures:  CT HEAD without contrast negative for bleed  Antibiotics:  none  HPI/Subjective:  no new complaints. She feels fine, no dizziness.   Objective: Filed Vitals:   08/08/12 1245 08/08/12 1411 08/08/12 2108 08/09/12 0529  BP: 153/55 145/72 155/72 151/77  Pulse: 69 68 71 74  Temp:  97.3 F (36.3 C) 99 F (37.2 C) 98.5 F (36.9 C)  TempSrc:  Oral Oral  Oral  Resp: 21 18 18 18   Height:      Weight:      SpO2: 96% 96% 95% 92%    Intake/Output Summary (Last 24 hours) at 08/09/12 0848 Last data filed at 08/09/12 0800  Gross per 24 hour  Intake    360 ml  Output    105 ml  Net    255 ml   Filed Weights   08/08/12 0944  Weight: 56.7 kg (125 lb)    Exam:   General:  Alert afebrile comfortable  Cardiovascular: s1s2  Respiratory: CTAB  Abdomen: SOFT NT ND BS+   Data Reviewed: Basic Metabolic Panel:  Lab 08/09/12 8119 08/08/12 1043  NA 142 139  K 4.1 4.3  CL 107 102  CO2 27 --  GLUCOSE 96 153*  BUN 14 21  CREATININE 0.65 0.90  CALCIUM 9.7 --  MG -- --  PHOS -- --   Liver Function Tests: No results found for this basename: AST:5,ALT:5,ALKPHOS:5,BILITOT:5,PROT:5,ALBUMIN:5 in the last 168 hours No results found for this basename: LIPASE:5,AMYLASE:5 in the last 168 hours No results found for this basename: AMMONIA:5 in the last 168 hours CBC:  Lab 08/09/12 0225 08/08/12 1043 08/08/12 1013  WBC 8.4 -- 10.8*  NEUTROABS -- -- 9.0*  HGB 11.2* 13.6 12.6  HCT 35.0* 40.0 39.2  MCV 88.6 -- 89.9  PLT 291 -- 291   Cardiac Enzymes:  Lab 08/09/12 0225 08/08/12 1941 08/08/12 1432  CKTOTAL 81 49 34  CKMB 2.3 2.1 1.9  CKMBINDEX -- -- --  TROPONINI <0.30 <0.30 <0.30   BNP (last  3 results) No results found for this basename: PROBNP:3 in the last 8760 hours CBG:  Lab 08/09/12 0557 08/08/12 2218 08/08/12 1636  GLUCAP 93 101* 138*    No results found for this or any previous visit (from the past 240 hour(s)).   Studies: Ct Head Wo Contrast  08/08/2012  *RADIOLOGY REPORT*  Clinical Data: Syncope and dizziness  CT HEAD WITHOUT CONTRAST  Technique:  Contiguous axial images were obtained from the base of the skull through the vertex without contrast.  Comparison: None.  Findings: Advanced atrophy.  Chronic microvascular ischemic changes in the white matter.  No acute infarct.  Negative for hemorrhage or mass.  Calvarium  intact.  IMPRESSION: Advanced atrophy.  Chronic microvascular ischemic changes without acute abnormality.   Original Report Authenticated By: Camelia Phenes, M.D.    Dg Chest Portable 1 View  08/08/2012  *RADIOLOGY REPORT*  Clinical Data: Near syncope.  PORTABLE CHEST - 1 VIEW  Comparison: 10/01/2007 CT.  02/21/2005 chest x-ray.  Findings: Left base scarring.  The CT detected left lower lobe 5 mm nodule is not adequately assessed by plain film exam.  No infiltrate, congestive heart failure or pneumothorax.  Central pulmonary vascular prominence stable.  Cardiomegaly.  Calcified mildly tortuous aorta.  IMPRESSION: Left base scarring.  The CT detected left lower lobe 5 mm nodule is not adequately assessed by plain film exam.  No infiltrate, congestive heart failure or pneumothorax.  Central pulmonary vascular prominence stable.  Cardiomegaly.  Calcified mildly tortuous aorta.   Original Report Authenticated By: Fuller Canada, M.D.     Scheduled Meds:   . aspirin  81 mg Oral Daily  . atorvastatin  10 mg Oral Daily  . calcitonin (salmon)  1 spray Alternating Nares Daily  . calcium-vitamin D  1 tablet Oral BID  . donepezil  10 mg Oral Daily  . fesoterodine  8 mg Oral Daily  . insulin aspart  0-5 Units Subcutaneous QHS  . insulin aspart  0-9 Units Subcutaneous TID WC  . memantine  10 mg Oral BID  . pantoprazole  40 mg Oral [id number]  . sodium chloride  3 mL Intravenous Q12H  . DISCONTD: CALTRATE 600+D PLUS  1 tablet Oral BID   Continuous Infusions:   . sodium chloride 75 mL/hr at 08/09/12 1610    Principal Problem:  *Syncope Active Problems:  DIABETES MELLITUS, TYPE II  HYPERLIPIDEMIA  Essential hypertension, benign  GERD  Dementia  Abnormal EKG    Time spent: 28 minuttes    [name]  [facility] Pager [phone]. If 8PM-8AM, please contact night-coverage at [email or web address], password [facility] 08/09/2012, 8:48 AM  LOS: 1 day

## 2012-08-09 NOTE — Progress Notes (Signed)
  Echocardiogram 2D Echocardiogram has been performed.  Cathie Beams 08/09/2012, 4:01 PM

## 2012-08-09 NOTE — Care Management Note (Signed)
    Page 1 of 1   08/10/2012     11:38:33 AM   CARE MANAGEMENT NOTE 08/10/2012  Patient:  [name], [name]   Account Number:  [id number]  Date Initiated:  08/09/2012  Documentation initiated by:  [facility]  Subjective/Objective Assessment:   Syncopal episode.  lives with sisters     Action/Plan:   PT ACTIVE WITH [facility] FOR HH AIDES, PRIVATE DUTY CARE.   Anticipated DC Date:  08/10/2012   Anticipated DC Plan:  HOME W HOME HEALTH SERVICES      DC Planning Services  CM consult      Choice offered to / List presented to:             Status of service:  Completed, signed off Medicare Important Message given?   (If response is "NO", the following Medicare IM given date fields will be blank) Date Medicare IM given:   Date Additional Medicare IM given:    Discharge Disposition:  HOME W HOME HEALTH SERVICES  Per UR Regulation:  Reviewed for med. necessity/level of care/duration of stay  If discussed at Long Length of Stay Meetings, dates discussed:    Comments:  08/10/12 Jyden Kromer,RN,BSN 1100 PT FOR DC HOME TODAY.  SISTER CONFIRMS PT HAS ALL DME AND PRIVATE DUTY SERVICES ARRANGED AT HOME.  08-09-12  10:50pm Avie Arenas, RNBSN [phone] Talked with sister, Carney Bern and niece, [name] in room.  Lives at home with 2 sisters, and 24 hour caregivers with [employer], that assist them around the house.  No other needs identifed at this time.   Has cane, walker and shower chair at home.

## 2012-08-10 LAB — GLUCOSE, CAPILLARY
Glucose-Capillary: 185 mg/dL — ABNORMAL HIGH (ref 70–99)
Glucose-Capillary: 90 mg/dL (ref 70–99)

## 2012-08-10 MED ORDER — AMLODIPINE BESYLATE 5 MG PO TABS
5.0000 mg | ORAL_TABLET | Freq: Every day | ORAL | Status: DC
  Filled 2012-08-10: qty 1

## 2012-08-10 MED ORDER — AMLODIPINE BESYLATE 5 MG PO TABS: 5.0000 mg | ORAL_TABLET | Freq: Every day | ORAL | Status: DC

## 2012-08-10 MED ORDER — HYDRALAZINE HCL 20 MG/ML IJ SOLN
5.0000 mg | Freq: Once | INTRAMUSCULAR | Status: AC
  Administered 2012-08-10: 5 mg via INTRAVENOUS
  Filled 2012-08-10: qty 0.25

## 2012-08-10 NOTE — Discharge Summary (Signed)
Physician Discharge Summary  [name] ZOX:[id number] DOB: [birth year] DOA: 08/08/2012  PCP: Roxy Manns, MD  Admit date: 08/08/2012 Discharge date: 08/10/2012  Recommendations for Outpatient Follow-up:  1. Follow up with PCP in one week 2. Please check blood pressure every morning and note it down and take it to PCP  For the office visit.   Discharge Diagnoses:  Principal Problem:  *Syncope Active Problems:  DIABETES MELLITUS, TYPE II  HYPERLIPIDEMIA  Essential hypertension, benign  GERD  Dementia  Abnormal EKG   Discharge Condition: stable  Diet recommendation: low salt diet. Filed Weights   08/08/12 0944  Weight: 56.7 kg (125 lb)    History of present illness:   Patient is 78 year old female with history of Alzheimer's dementia, depression, diabetes, hyperlipidemia, GERD, osteoporosis was brought to [facility] after a syncopal episode. Patient has dementia his history was obtained from the patient's sister who lives with her and and patient's niece. Per the patient's sister, she woke up at 7 AM today for a dental appointment at 9:30am And was sitting at the dining table eating her breakfast when patient spilled her coffee. Patient's sister noticed that patient had not been aware of her coffee spilling and her "eyes fluttered then closed". She called EMS and with the help of a caregiver, assisted the patient to the floor. She was admitted for syncope evaluation.   Hospital Course:   .Syncope: No clear etiology, possibly vasovagal,. Slight leukocytosis on admission, Infectious etiology ruled out with negative UA and CXR. CT head without contrast negative for acute CVA. EKG showed rate of 70, NSR, TWI in V2-V3 on admission. Repeat EKG -  She was Admitted under to observation, serial cardiac enzymes negative so far . 2-D echocardiogram was done showed good LV EF and grade 1 diastolic dysfunction. No arrythmia's on telemetry overnight. She is discharged on aspirin.    Marland KitchenDIABETES  MELLITUS, TYPE II  - hemoglobin A1c is 6.5%, .Resume metformin.  CBG (last 3)   Basename 08/10/12 0615 08/09/12 2250 08/09/12 2036  GLUCAP 90 122* 185*       .HYPERLIPIDEMIA:  - Continue statin   .Essential hypertension: BP high this morning. Started her on norvasc 5 mg daily. She is also recommended to check BP Daily and note it down, so she can show the BP readings to her PCP.   Marland KitchenGERD: Continue PPI   .Dementia: Continue Namenda and Aricept      Procedures:  ECHO  CXR  Consultations:  NONE  Discharge Exam: Filed Vitals:   08/10/12 0700  BP: 170/67  Pulse: 71  Temp:   Resp: 18   Filed Vitals:   08/09/12 1337 08/09/12 1934 08/10/12 0409 08/10/12 0700  BP: 147/71 179/70 182/63 170/67  Pulse: 72 84 79 71  Temp: 97.7 F (36.5 C) 97.6 F (36.4 C) 97.7 F (36.5 C)   TempSrc: Oral Oral Oral   Resp: 18 18 18 18   Height:      Weight:      SpO2: 95% 96% 98%    General: Alert afebrile comfortable Cardiovascular: s1s2 Respiratory: CTAB Abdomen: SOFT NT ND BS+    Discharge Instructions  Discharge Orders    Future Appointments: Provider: Department: Dept Phone: Center:   08/17/2012 11:15 AM Judy Pimple, MD [facility] [phone] [email or web address]     Future Orders Please Complete By Expires   Diet - low sodium heart healthy      Diet - low sodium heart healthy      Discharge instructions  Comments:   Follow up with PCP in one week.   Activity as tolerated - No restrictions      Discharge instructions      Comments:   Follow with PCP as recommended.   Activity as tolerated - No restrictions        Medication List  As of 08/10/2012  9:56 AM   STOP taking these medications         cephALEXin 500 MG capsule         TAKE these medications         amLODipine 5 MG tablet   Commonly known as: NORVASC   Take 1 tablet (5 mg total) by mouth daily.      aspirin 81 MG EC tablet   Take 81 mg by mouth daily.      atorvastatin 10 MG tablet    Commonly known as: LIPITOR   Take 10 mg by mouth daily.      calcitonin (salmon) 200 UNIT/ACT nasal spray   Commonly known as: MIACALCIN/FORTICAL   Place 1 spray into the nose daily.      CALTRATE 600+D PLUS 600-400 MG-UNIT per tablet   Take 1 tablet by mouth 2 (two) times daily.      donepezil 10 MG tablet   Commonly known as: ARICEPT   Take 10 mg by mouth daily.      glucose blood test strip   Use to check blood sugar once daily and as directed      glucose monitoring kit monitoring kit   Use to check blood sugar once daily as needed for DM 250.0      Lancets 28G Misc   Use to check blood sugar once a day and as directed      memantine 10 MG tablet   Commonly known as: NAMENDA   Take 10 mg by mouth 2 (two) times daily.      metFORMIN 500 MG tablet   Commonly known as: GLUCOPHAGE   Take 1 tablet (500 mg total) by mouth 2 (two) times daily with a meal.      pantoprazole 40 MG tablet   Commonly known as: PROTONIX   Take 1 tablet (40 mg total) by mouth every morning.      tolterodine 4 MG 24 hr capsule   Commonly known as: DETROL LA   Take 1 capsule (4 mg total) by mouth daily.              The results of significant diagnostics from this hospitalization (including imaging, microbiology, ancillary and laboratory) are listed below for reference.    Significant Diagnostic Studies: Ct Head Wo Contrast  08/08/2012  *RADIOLOGY REPORT*  Clinical Data: Syncope and dizziness  CT HEAD WITHOUT CONTRAST  Technique:  Contiguous axial images were obtained from the base of the skull through the vertex without contrast.  Comparison: None.  Findings: Advanced atrophy.  Chronic microvascular ischemic changes in the white matter.  No acute infarct.  Negative for hemorrhage or mass.  Calvarium intact.  IMPRESSION: Advanced atrophy.  Chronic microvascular ischemic changes without acute abnormality.   Original Report Authenticated By: Camelia Phenes, M.D.    Dg Chest Portable 1  View  08/08/2012  *RADIOLOGY REPORT*  Clinical Data: Near syncope.  PORTABLE CHEST - 1 VIEW  Comparison: 10/01/2007 CT.  02/21/2005 chest x-ray.  Findings: Left base scarring.  The CT detected left lower lobe 5 mm nodule is not adequately assessed by plain film exam.  No  infiltrate, congestive heart failure or pneumothorax.  Central pulmonary vascular prominence stable.  Cardiomegaly.  Calcified mildly tortuous aorta.  IMPRESSION: Left base scarring.  The CT detected left lower lobe 5 mm nodule is not adequately assessed by plain film exam.  No infiltrate, congestive heart failure or pneumothorax.  Central pulmonary vascular prominence stable.  Cardiomegaly.  Calcified mildly tortuous aorta.   Original Report Authenticated By: Fuller Canada, M.D.     Microbiology: Recent Results (from the past 240 hour(s))  URINE CULTURE     Status: Normal (Preliminary result)   Collection Time   08/09/12  2:12 AM      Component Value Range Status Comment   Specimen Description URINE, CLEAN CATCH   Final    Special Requests NONE   Final    Culture  Setup Time 08/09/2012 08:36   Final    Colony Count 20,OOO COLONIES/ML   Final    Culture ESCHERICHIA COLI   Final    Report Status PENDING   Incomplete      Labs: Basic Metabolic Panel:  Lab 08/09/12 1610 08/08/12 1043  NA 142 139  K 4.1 4.3  CL 107 102  CO2 27 --  GLUCOSE 96 153*  BUN 14 21  CREATININE 0.65 0.90  CALCIUM 9.7 --  MG -- --  PHOS -- --   Liver Function Tests: No results found for this basename: AST:5,ALT:5,ALKPHOS:5,BILITOT:5,PROT:5,ALBUMIN:5 in the last 168 hours No results found for this basename: LIPASE:5,AMYLASE:5 in the last 168 hours No results found for this basename: AMMONIA:5 in the last 168 hours CBC:  Lab 08/09/12 0225 08/08/12 1043 08/08/12 1013  WBC 8.4 -- 10.8*  NEUTROABS -- -- 9.0*  HGB 11.2* 13.6 12.6  HCT 35.0* 40.0 39.2  MCV 88.6 -- 89.9  PLT 291 -- 291   Cardiac Enzymes:  Lab 08/09/12 0225 08/08/12 1941  08/08/12 1432  CKTOTAL 81 49 34  CKMB 2.3 2.1 1.9  CKMBINDEX -- -- --  TROPONINI <0.30 <0.30 <0.30   BNP: BNP (last 3 results) No results found for this basename: PROBNP:3 in the last 8760 hours CBG:  Lab 08/10/12 0615 08/09/12 2250 08/09/12 2036 08/09/12 1637 08/09/12 1147  GLUCAP 90 122* 185* 113* 120*    Time coordinating discharge: 42 minutes  Signed:  Brandonlee Navis  [facility] 08/10/2012, 9:56 AM

## 2012-08-10 NOTE — Progress Notes (Signed)
Pt discharge instructions and patient education complete. IV site d/c. Site WNL. Family updated at bedside. D/C home with family. [name]

## 2012-08-10 NOTE — Progress Notes (Signed)
Nurse checked patient's BP @ 0400: 182/63. Nurse paged NP Schorr. NP Schorr wrote an order for Hydralazine 5 mg IV. Nurse acknowledged order and performed as was instructed. Harmon Pier

## 2012-08-11 LAB — URINE CULTURE

## 2012-08-13 ENCOUNTER — Telehealth: Payer: Self-pay | Admitting: *Deleted

## 2012-08-13 NOTE — Telephone Encounter (Signed)
Have her come in for scheduled appt and we will accomplish as much as time allows -- depending on her follow up needs Will do any labs at that time that she needs that day - we will review discharge together as well

## 2012-08-13 NOTE — Telephone Encounter (Signed)
Patient has been in the hospital and was told to follow-up in one week with Dr. Milinda Antis. Patient already has a physical scheduled this week and wants to combine these appointments. Patient's sister wants to know if she needs more lab work since her lab work had to be cancelled last week? Patient had a lot of lab work while in the hospital. Please advise.

## 2012-08-17 ENCOUNTER — Ambulatory Visit (INDEPENDENT_AMBULATORY_CARE_PROVIDER_SITE_OTHER): Payer: Medicare Other | Admitting: Family Medicine

## 2012-08-17 ENCOUNTER — Encounter: Payer: Self-pay | Admitting: Family Medicine

## 2012-08-17 VITALS — BP 126/68 | HR 88 | Temp 97.5°F | Ht 60.0 in | Wt 118.0 lb

## 2012-08-17 DIAGNOSIS — N390 Urinary tract infection, site not specified: Secondary | ICD-10-CM

## 2012-08-17 DIAGNOSIS — I10 Essential (primary) hypertension: Secondary | ICD-10-CM

## 2012-08-17 DIAGNOSIS — IMO0002 Reserved for concepts with insufficient information to code with codable children: Secondary | ICD-10-CM

## 2012-08-17 DIAGNOSIS — I872 Venous insufficiency (chronic) (peripheral): Secondary | ICD-10-CM | POA: Insufficient documentation

## 2012-08-17 DIAGNOSIS — S40812A Abrasion of left upper arm, initial encounter: Secondary | ICD-10-CM | POA: Insufficient documentation

## 2012-08-17 LAB — POCT URINALYSIS DIPSTICK
Glucose, UA: NEGATIVE
Nitrite, UA: POSITIVE
Spec Grav, UA: >=1.03
Urobilinogen, UA: 0.2

## 2012-08-17 NOTE — Progress Notes (Signed)
Subjective:    Patient ID: [name], female    DOB: 20-Oct-1922, 78 y.o.   MRN: [id number]  HPI Here for check up of chronic medical conditions and to review health mt list  And also f/u of hosp for syncope  Has place on ankle to check  R leg was worse a few days ago  Also a skin lesion on arm - that had odor to it - now has been cleaned and is improving    Pt had syncopal episode at her kitchen table  EMS transported to hosp R/o MI, nl echo, stable EKG, cxr good - d/c on asa   Has to take proph abx before dental appt -- took keflex- told to stop that -- 2 episodes with it   Did have slt leukocytosis and ucx pos for 20,000 e coli  Hb was 11.2 a1c 6.5- very stable   bp was up - put on norvasc bp is stable today  No cp or palpitations or headaches or edema  No side effects to medicines  BP Readings from Last 3 Encounters:  08/17/12 126/68  08/10/12 170/67  01/24/12 120/64    At home avg one teens-120s/ 50s-70s  No side effects of the amlodipine   mammo was 5/12- she could hardly tolerate the test- she screamed and yelled - so she cancelled  Self exam-no changes   Zoster status-  Dementia - seems stable in general - on meds  Her mood is calm and she always seems content  Patient Active Problem List  Diagnosis  . DIABETES MELLITUS, TYPE II  . HYPERLIPIDEMIA  . Essential hypertension, benign  . GERD  . OVERACTIVE BLADDER  . DEGENERATIVE JOINT DISEASE  . OSTEOPOROSIS NOS  . MEMORY LOSS  . INCONTINENCE, URGE  . Syncope  . Dementia  . Abnormal EKG  . Venous insufficiency  . Abrasion of arm, left  . UTI (lower urinary tract infection)   Past Medical History  Diagnosis Date  . Depression     type 2  . Hyperlipidemia   . Osteopenia   . Memory loss, short term 3/09    mild cognitive impairment  . Abnormal CT of the chest 10/08    5 mm nodule- needs f/u CT in 1 year  . Squamous cell carcinoma of leg   . DM type 2 (diabetes mellitus, type 2)   .  Dysrhythmia     "irregular at times"  . GERD (gastroesophageal reflux disease)   . Alzheimer disease     "severe" (08/08/2012)   Past Surgical History  Procedure Date  . Total hip arthroplasty ? first OR; 02/2005    right X 2  . Ischemic colitis 10/98    colonoscopy  . Squamous cell carcinoma excision 10/2001    on leg  . Pelvic fracture surgery 07/2002    after a fall  . Cataract extraction, bilateral 2010  . Dexa 11/01    osteopenia  . Vaginal hysterectomy 1972  . Bladder suspension 02/2001    pelvic vaginal sling for incontinence   History  Substance Use Topics  . Smoking status: Never Smoker   . Smokeless tobacco: Never Used  . Alcohol Use: No   Family History  Problem Relation Age of Onset  . Stroke Mother   . Dementia Mother   . Stroke Father   . Breast cancer Sister   . Prostate cancer Brother   . Breast cancer Sister   . Memory loss Sister   .  Breast cancer Other     neice  . Alzheimer's disease Sister     older sister  . Memory loss Sister     mild   Allergies  Allergen Reactions  . Alendronate Sodium     REACTION: GI; "don't really remember reaction"  . Keflex (Cephalexin)     Fainting?  . Risedronate Sodium     REACTION: GI; "don't really remember reaction"   Current Outpatient Prescriptions on File Prior to Visit  Medication Sig Dispense Refill  . amLODipine (NORVASC) 5 MG tablet Take 1 tablet (5 mg total) by mouth daily.  30 tablet  0  . aspirin 81 MG EC tablet Take 81 mg by mouth daily.        Marland Kitchen atorvastatin (LIPITOR) 10 MG tablet Take 10 mg by mouth daily.      . calcitonin, salmon, (MIACALCIN/FORTICAL) 200 UNIT/ACT nasal spray Place 1 spray into the nose daily.  3.7 mL  3  . Calcium Carbonate-Vit D-Min (CALTRATE 600+D PLUS) 600-400 MG-UNIT per tablet Take 1 tablet by mouth 2 (two) times daily.        Marland Kitchen donepezil (ARICEPT) 10 MG tablet Take 10 mg by mouth daily.        Marland Kitchen glucose blood (ONE TOUCH ULTRA TEST) test strip Use to check blood sugar  once daily and as directed  100 each  11  . glucose monitoring kit (FREESTYLE) monitoring kit Use to check blood sugar once daily as needed for DM 250.0       . Lancets 28G MISC Use to check blood sugar once a day and as directed       . memantine (NAMENDA) 10 MG tablet Take 10 mg by mouth 2 (two) times daily.      . metFORMIN (GLUCOPHAGE) 500 MG tablet Take 1 tablet (500 mg total) by mouth 2 (two) times daily with a meal.  60 tablet  11  . pantoprazole (PROTONIX) 40 MG tablet Take 1 tablet (40 mg total) by mouth every morning.  30 tablet  11  . tolterodine (DETROL LA) 4 MG 24 hr capsule Take 1 capsule (4 mg total) by mouth daily.  30 capsule  3        Review of Systems Review of Systems  Constitutional: Negative for fever, appetite change, fatigue and unexpected weight change.  Eyes: Negative for pain and visual disturbance.  Respiratory: Negative for cough and shortness of breath.   Cardiovascular: Negative for cp or palpitations    Gastrointestinal: Negative for nausea, diarrhea and constipation.  Genitourinary: Negative for urgency and frequency. neg for dysuria or blood in urine  Skin: Negative for pallor or rash  pos for ? Ulcer or skin tears on arm and ankle Neurological: Negative for weakness, light-headedness, numbness and headaches.  Hematological: Negative for adenopathy. Does not bruise/bleed easily.  Psychiatric/Behavioral: Negative for dysphoric mood. The patient is not nervous/anxious.  pos for poor short term memory       Objective:   Physical Exam  Constitutional: She appears well-developed and well-nourished. No distress.  HENT:  Head: Normocephalic.  Mouth/Throat: Oropharynx is clear and moist.  Eyes: Conjunctivae and EOM are normal. Pupils are equal, round, and reactive to light. Right eye exhibits no discharge. Left eye exhibits no discharge.  Neck: Normal range of motion. Neck supple. No thyromegaly present.  Cardiovascular: Normal rate, regular rhythm and  normal heart sounds.  Exam reveals no gallop.   Pulmonary/Chest: Effort normal and breath sounds normal. No respiratory distress.  She has no wheezes.  Abdominal: Soft. Bowel sounds are normal. She exhibits no distension and no mass. There is no tenderness.       No suprapubic tenderness or fullness    No cva tenderness   Musculoskeletal: She exhibits no edema and no tenderness.       Diffuse varicosities  L lat malleolus is slt red -no broken skin   Lymphadenopathy:    She has no cervical adenopathy.  Neurological: She is alert. She has normal reflexes. She displays no atrophy and no tremor. No cranial nerve deficit or sensory deficit. She exhibits normal muscle tone. Coordination and gait normal.       No facial droop  Baseline dementia   Skin: Skin is warm and dry. No rash noted. No erythema. No pallor.       Skin tear - small L arm - is clean with clear drainage and some granulation tissue   Psychiatric: She has a normal mood and affect. Cognition and memory are impaired. She exhibits abnormal recent memory.       Baseline dementia Answers questions appropriately  Falls asleep at times           Assessment & Plan:

## 2012-08-17 NOTE — Patient Instructions (Addendum)
No more keflex Keep using triple antibiotic on spot on arm and ankle - keep clean with antibacterial soap and water No more mammograms  If you are interested in a shingles/zoster vaccine - call your insurance to check on coverage,( you should not get it within 1 month of other vaccines) , then call us for a prescription  for it to take to a pharmacy that gives the shot  Keep tracking blood pressure  Follow up with me in 3-6 months Try to give a urine specimen on way out for a culture

## 2012-08-19 NOTE — Assessment & Plan Note (Signed)
Today-= looking clean and uninfected Disc care - antibacterial soap and water and abx ointment / loose covering Call if worse or changes

## 2012-08-19 NOTE — Assessment & Plan Note (Signed)
Had this in hospital  Will cx urine and treat accordingly  No symptoms at this time

## 2012-08-19 NOTE — Assessment & Plan Note (Signed)
Improved with addn of norvasc- will watch this at home and watch closely for hypotension F/u planned

## 2012-08-19 NOTE — Assessment & Plan Note (Signed)
Early skin change of ulcer  on L lateral malleolus  Has not opened at this time  Has varicosities - disc supp hose- but given frail state and dementia her caregiver thinks this would be impossible  Will elevate legs- watch this carefully

## 2012-08-21 ENCOUNTER — Telehealth: Payer: Self-pay | Admitting: Family Medicine

## 2012-08-21 LAB — URINE CULTURE

## 2012-08-21 MED ORDER — CIPROFLOXACIN HCL 250 MG PO TABS: 250.0000 mg | ORAL_TABLET | Freq: Two times a day (BID) | ORAL | Status: AC

## 2012-08-21 NOTE — Telephone Encounter (Signed)
Let pt know she has a uti and I want to tx her with cipro  Please call in or send electronically please Update if any problems

## 2012-08-22 NOTE — Telephone Encounter (Signed)
Rx Ciproflaxin 250 mg called in to [facility].

## 2012-08-31 ENCOUNTER — Telehealth: Payer: Self-pay | Admitting: Family Medicine

## 2012-08-31 MED ORDER — AMLODIPINE BESYLATE 5 MG PO TABS: 5.0000 mg | ORAL_TABLET | Freq: Every day | ORAL | Status: DC

## 2012-08-31 NOTE — Telephone Encounter (Signed)
Needs refil on amlodipine

## 2012-09-16 ENCOUNTER — Encounter (HOSPITAL_COMMUNITY): Payer: Self-pay

## 2012-09-16 ENCOUNTER — Emergency Department (HOSPITAL_COMMUNITY): Payer: Medicare Other

## 2012-09-16 ENCOUNTER — Observation Stay (HOSPITAL_COMMUNITY)
Admission: EM | Admit: 2012-09-16 | Discharge: 2012-09-17 | Disposition: A | Discharge status: Home / Self Care | Payer: Medicare Other | Attending: Internal Medicine | Admitting: Internal Medicine

## 2012-09-16 DIAGNOSIS — E785 Hyperlipidemia, unspecified: Secondary | ICD-10-CM

## 2012-09-16 DIAGNOSIS — M1990 Unspecified osteoarthritis, unspecified site: Secondary | ICD-10-CM

## 2012-09-16 DIAGNOSIS — Z96649 Presence of unspecified artificial hip joint: Secondary | ICD-10-CM | POA: Insufficient documentation

## 2012-09-16 DIAGNOSIS — E119 Type 2 diabetes mellitus without complications: Secondary | ICD-10-CM

## 2012-09-16 DIAGNOSIS — I959 Hypotension, unspecified: Secondary | ICD-10-CM | POA: Insufficient documentation

## 2012-09-16 DIAGNOSIS — M810 Age-related osteoporosis without current pathological fracture: Secondary | ICD-10-CM

## 2012-09-16 DIAGNOSIS — R55 Syncope and collapse: Principal | ICD-10-CM

## 2012-09-16 DIAGNOSIS — G309 Alzheimer's disease, unspecified: Secondary | ICD-10-CM | POA: Insufficient documentation

## 2012-09-16 DIAGNOSIS — S40812A Abrasion of left upper arm, initial encounter: Secondary | ICD-10-CM

## 2012-09-16 DIAGNOSIS — I10 Essential (primary) hypertension: Secondary | ICD-10-CM

## 2012-09-16 DIAGNOSIS — N390 Urinary tract infection, site not specified: Secondary | ICD-10-CM

## 2012-09-16 DIAGNOSIS — R9431 Abnormal electrocardiogram [ECG] [EKG]: Secondary | ICD-10-CM

## 2012-09-16 DIAGNOSIS — N318 Other neuromuscular dysfunction of bladder: Secondary | ICD-10-CM

## 2012-09-16 DIAGNOSIS — K219 Gastro-esophageal reflux disease without esophagitis: Secondary | ICD-10-CM

## 2012-09-16 DIAGNOSIS — I872 Venous insufficiency (chronic) (peripheral): Secondary | ICD-10-CM

## 2012-09-16 DIAGNOSIS — F0280 Dementia in other diseases classified elsewhere without behavioral disturbance: Secondary | ICD-10-CM | POA: Insufficient documentation

## 2012-09-16 DIAGNOSIS — F0390 Unspecified dementia without behavioral disturbance: Secondary | ICD-10-CM

## 2012-09-16 DIAGNOSIS — N3941 Urge incontinence: Secondary | ICD-10-CM

## 2012-09-16 LAB — BASIC METABOLIC PANEL
BUN: 26 mg/dL — ABNORMAL HIGH (ref 6–23)
CO2: 24 mEq/L (ref 19–32)
Chloride: 98 mEq/L (ref 96–112)
Creatinine, Ser: 1.01 mg/dL (ref 0.50–1.10)

## 2012-09-16 LAB — URINALYSIS, ROUTINE W REFLEX MICROSCOPIC
Bilirubin Urine: NEGATIVE
Glucose, UA: NEGATIVE mg/dL
Hgb urine dipstick: NEGATIVE
Protein, ur: NEGATIVE mg/dL

## 2012-09-16 LAB — CBC
HCT: 38.1 % (ref 36.0–46.0)
MCH: 28.9 pg (ref 26.0–34.0)
MCV: 88.8 fL (ref 78.0–100.0)
RBC: 4.29 MIL/uL (ref 3.87–5.11)
WBC: 9.9 10*3/uL (ref 4.0–10.5)

## 2012-09-16 LAB — GLUCOSE, CAPILLARY: Glucose-Capillary: 187 mg/dL — ABNORMAL HIGH (ref 70–99)

## 2012-09-16 MED ORDER — ONDANSETRON HCL 4 MG/2ML IJ SOLN: 4.0000 mg | Freq: Three times a day (TID) | INTRAMUSCULAR | Status: AC | PRN

## 2012-09-16 MED ORDER — ASPIRIN 81 MG PO TBEC: 81.0000 mg | DELAYED_RELEASE_TABLET | Freq: Every day | ORAL | Status: DC

## 2012-09-16 MED ORDER — MEMANTINE HCL 10 MG PO TABS
10.0000 mg | ORAL_TABLET | Freq: Two times a day (BID) | ORAL | Status: DC
  Administered 2012-09-16 – 2012-09-17 (×2): 10 mg via ORAL
  Filled 2012-09-16 (×4): qty 1

## 2012-09-16 MED ORDER — DONEPEZIL HCL 10 MG PO TABS
10.0000 mg | ORAL_TABLET | Freq: Every day | ORAL | Status: DC
  Administered 2012-09-17: 10 mg via ORAL
  Filled 2012-09-16: qty 1

## 2012-09-16 MED ORDER — ACETAMINOPHEN 650 MG RE SUPP: 650.0000 mg | Freq: Four times a day (QID) | RECTAL | Status: DC | PRN

## 2012-09-16 MED ORDER — AMLODIPINE BESYLATE 5 MG PO TABS
5.0000 mg | ORAL_TABLET | Freq: Every day | ORAL | Status: DC
  Filled 2012-09-16: qty 1

## 2012-09-16 MED ORDER — PANTOPRAZOLE SODIUM 40 MG PO TBEC
40.0000 mg | DELAYED_RELEASE_TABLET | Freq: Every day | ORAL | Status: DC
  Administered 2012-09-17: 40 mg via ORAL
  Filled 2012-09-16: qty 1

## 2012-09-16 MED ORDER — METFORMIN HCL 500 MG PO TABS
500.0000 mg | ORAL_TABLET | Freq: Two times a day (BID) | ORAL | Status: DC
  Administered 2012-09-17: 500 mg via ORAL
  Filled 2012-09-16 (×3): qty 1

## 2012-09-16 MED ORDER — ENOXAPARIN SODIUM 40 MG/0.4ML ~~LOC~~ SOLN
40.0000 mg | SUBCUTANEOUS | Status: DC
  Administered 2012-09-16: 40 mg via SUBCUTANEOUS
  Filled 2012-09-16 (×2): qty 0.4

## 2012-09-16 MED ORDER — ASPIRIN EC 81 MG PO TBEC
81.0000 mg | DELAYED_RELEASE_TABLET | Freq: Every day | ORAL | Status: DC
  Administered 2012-09-17: 81 mg via ORAL
  Filled 2012-09-16: qty 1

## 2012-09-16 MED ORDER — SODIUM CHLORIDE 0.9 % IJ SOLN
3.0000 mL | Freq: Two times a day (BID) | INTRAMUSCULAR | Status: DC
  Administered 2012-09-16 – 2012-09-17 (×2): 3 mL via INTRAVENOUS

## 2012-09-16 MED ORDER — ACETAMINOPHEN 325 MG PO TABS: 650.0000 mg | ORAL_TABLET | Freq: Four times a day (QID) | ORAL | Status: DC | PRN

## 2012-09-16 MED ORDER — SODIUM CHLORIDE 0.9 % IV SOLN
INTRAVENOUS | Status: AC
  Administered 2012-09-16: 22:00:00 via INTRAVENOUS

## 2012-09-16 MED ORDER — CALCITONIN (SALMON) 200 UNIT/ACT NA SOLN
1.0000 | Freq: Every day | NASAL | Status: DC
Start: 2012-09-17 — End: 2012-09-17
  Administered 2012-09-17: 1 via NASAL
  Filled 2012-09-16: qty 3.7

## 2012-09-16 MED ORDER — ATORVASTATIN CALCIUM 10 MG PO TABS
10.0000 mg | ORAL_TABLET | Freq: Every day | ORAL | Status: DC
  Administered 2012-09-16: 10 mg via ORAL
  Filled 2012-09-16 (×2): qty 1

## 2012-09-16 NOTE — ED Notes (Signed)
Per EMS, pt was found to be "slumped over" on church pew.  Off duty fireman at church stated that he "opened her airway and she became alert".  EMS asked if pt had stopped breathing and he said no.  Pt was seen recently for the same and her BP meds were changed as it was thought that was the cause.   EMS completed orthostatic VS and they were negative, negative stroke scale.

## 2012-09-16 NOTE — H&P (Signed)
[facility] History and Physical  [name] ZOX:[id number] DOB: 24-Feb-1922 DOA: 09/16/2012   PCP: Roxy Manns, MD   Chief Complaint:  Chief Complaint  Patient presents with  . Loss of Consciousness     HPI: [name] is a 78 y.o. female with hx of dementia and recurrent syncopal episodes who was brought today after an episode of syncope at church . Initial BP in the ED low at 90/60. Patient has no recollection of event nut she has dementia. Sister who is present in room reports that the patient did just passed out without any warning during sermon. No seizure activity. Came around immediately.    Review of Systems:  Patient denies any pain/  She cannot give a good ROS as she has dementia. Sister does not report intercurrent illensses  Past Medical History  Diagnosis Date  . Depression     type 2  . Hyperlipidemia   . Osteopenia   . Memory loss, short term 3/09    mild cognitive impairment  . Abnormal CT of the chest 10/08    5 mm nodule- needs f/u CT in 1 year  . Squamous cell carcinoma of leg   . DM type 2 (diabetes mellitus, type 2)   . Dysrhythmia     "irregular at times"  . GERD (gastroesophageal reflux disease)   . Alzheimer disease     "severe" (08/08/2012)   Past Surgical History  Procedure Date  . Total hip arthroplasty ? first OR; 02/2005    right X 2  . Ischemic colitis 10/98    colonoscopy  . Squamous cell carcinoma excision 10/2001    on leg  . Pelvic fracture surgery 07/2002    after a fall  . Cataract extraction, bilateral 2010  . Dexa 11/01    osteopenia  . Vaginal hysterectomy 1972  . Bladder suspension 02/2001    pelvic vaginal sling for incontinence   Social History:  reports that she has never smoked. She has never used smokeless tobacco. She reports that she does not drink alcohol or use illicit drugs. Lives at home with sister   Allergies  Allergen Reactions  . Alendronate Sodium     REACTION: GI; "don't really remember  reaction"  . Keflex (Cephalexin)     Fainting?  . Risedronate Sodium     REACTION: GI; "don't really remember reaction"    Family History  Problem Relation Age of Onset  . Stroke Mother   . Dementia Mother   . Stroke Father   . Breast cancer Sister   . Prostate cancer Brother   . Breast cancer Sister   . Memory loss Sister   . Breast cancer Other     neice  . Alzheimer's disease Sister     older sister  . Memory loss Sister     mild    Prior to Admission medications   Medication Sig Start Date End Date Taking? Authorizing Provider  amLODipine (NORVASC) 5 MG tablet Take 1 tablet (5 mg total) by mouth daily. 08/31/12 08/31/13 Yes Judy Pimple, MD  aspirin 81 MG EC tablet Take 81 mg by mouth daily.     Yes Historical Provider, MD  atorvastatin (LIPITOR) 10 MG tablet Take 10 mg by mouth at bedtime.  01/19/12  Yes Judy Pimple, MD  calcitonin, salmon, (MIACALCIN/FORTICAL) 200 UNIT/ACT nasal spray Place 1 spray into the nose daily. 03/19/12  Yes Judy Pimple, MD  Calcium Carbonate-Vit D-Min (CALTRATE 600+D PLUS) 600-400  MG-UNIT per tablet Take 1 tablet by mouth 2 (two) times daily.     Yes Historical Provider, MD  donepezil (ARICEPT) 10 MG tablet Take 10 mg by mouth daily.     Yes Historical Provider, MD  memantine (NAMENDA) 10 MG tablet Take 10 mg by mouth 2 (two) times daily.   Yes Historical Provider, MD  metFORMIN (GLUCOPHAGE) 500 MG tablet Take 1 tablet (500 mg total) by mouth 2 (two) times daily with a meal. 01/24/12  Yes Judy Pimple, MD  pantoprazole (PROTONIX) 40 MG tablet Take 1 tablet (40 mg total) by mouth every morning. 01/24/12  Yes Judy Pimple, MD   Physical Exam: Filed Vitals:   09/16/12 1819 09/16/12 1822 09/16/12 1825 09/16/12 1828  BP: 152/73 127/63 129/70 121/71  Pulse: 84 84 102 93  Temp: 97.8 F (36.6 C)     TempSrc: Oral     Resp: 20     Height: 5\' 3"  (1.6 m)     Weight: 52.889 kg (116 lb 9.6 oz)     SpO2: 98%        General:  axox1  Eyes:  perrla  ENT: ncat  Neck: no jvd, no tm  Cardiovascular: rrr, no M,r,g,  Respiratory: ctab   Abdomen: soft, nt, bs present  Skin: pale dry  Musculoskeletal: no deformities  Psychiatric: euthymic  Neurologic: cn 2-12 intact, strength 5/5 all 4 extremities, sensation intact   Labs on Admission:  Basic Metabolic Panel:  Lab 09/16/12 9563  NA 136  K 4.7  CL 98  CO2 24  GLUCOSE 94  BUN 26*  CREATININE 1.01  CALCIUM 10.1  MG --  PHOS --   Liver Function Tests: No results found for this basename: AST:5,ALT:5,ALKPHOS:5,BILITOT:5,PROT:5,ALBUMIN:5 in the last 168 hours No results found for this basename: LIPASE:5,AMYLASE:5 in the last 168 hours No results found for this basename: AMMONIA:5 in the last 168 hours CBC:  Lab 09/16/12 1323  WBC 9.9  NEUTROABS --  HGB 12.4  HCT 38.1  MCV 88.8  PLT 284   Cardiac Enzymes:  Lab 09/16/12 1602 09/16/12 1327  CKTOTAL -- --  CKMB -- --  CKMBINDEX -- --  TROPONINI <0.30 <0.30    BNP (last 3 results) No results found for this basename: PROBNP:3 in the last 8760 hours CBG: No results found for this basename: GLUCAP:5 in the last 168 hours  Radiological Exams on Admission: Dg Chest 2 View  09/16/2012  *RADIOLOGY REPORT*  Clinical Data: Syncope  CHEST - 2 VIEW  Comparison: 08/08/2012  Findings: Chronic interstitial markings. Mild lingular scarring, unchanged.  No pleural effusion or pneumothorax.  Cardiomediastinal silhouette is within normal limits.  Suspected small hiatal hernia.  Mild degenerative changes of the visualized thoracolumbar spine.  IMPRESSION: No evidence of acute cardiopulmonary disease.   Original Report Authenticated By: Charline Bills, M.D.       Assessment/Plan Active Problems:  DIABETES MELLITUS, TYPE II  HYPERLIPIDEMIA  Essential hypertension, benign  GERD  Syncope  Dementia  Venous insufficiency  UTI (lower urinary tract infection)   1. Syncope - recurrent - no cause found previously.  Echocardiogram from august without valvular abnormalities and with preserved EF. Patient is not orthostatic. Would refer for cardiology outpatient w/u with holter / event monitor. Doubt neurological syncope as she has no signs or symptoms of stroke.   2. DM 2 - diet controlled 3. Dementia - may need to consider stoping aricept as it has been linked with syncopal events in clinical  studies. 4. Hx of uti - UA unremarkable today   Code Status: dnr Family Communication: sister Disposition Plan: home   Time spent: 1 hour  Nefertari Rebman [facility] Pager [phone]  If 7PM-7AM, please contact night-coverage [email or web address] Password [email or web address] 09/16/2012, 6:50 PM

## 2012-09-16 NOTE — ED Notes (Signed)
Will transport pt to floor after she finishes eating her tray.

## 2012-09-16 NOTE — ED Notes (Signed)
Pt being transferred to 3W.

## 2012-09-16 NOTE — ED Provider Notes (Signed)
Medical screening examination/treatment/procedure(s) were conducted as a shared visit with non-physician practitioner(s) and myself.  I personally evaluated the patient during the encounter Pt with syncope today without prodromal sx.  Pt took 10-28min to arouse and initially was hypotensive by EMS.  Pt recently started BP med but denies CP, SOB, palpitations.  Pt exam currently is wnl.  Gwyneth Sprout, MD 09/16/12 2024

## 2012-09-16 NOTE — ED Provider Notes (Signed)
History     CSN: [id number]  Arrival date & time 09/16/12  1240   First MD Initiated Contact with Patient 09/16/12 1245      Chief Complaint  Patient presents with  . Loss of Consciousness    (Consider location/radiation/quality/duration/timing/severity/associated sxs/prior treatment) HPI Hx from pt and family at bedside. [name] is a 78 y.o. female with hx of dementia, DM who presents after syncopal episode while at church. Her sister was with her at church and reports that she slumped over on the pew during the service. She was not immediately responsive. EMS arrived and she was placed on oxygen and became responsive with this. Her initial blood pressure was 90/60. Per EMS, orthostatic vitals were negative. Pt does not recall losing consciousness and does not recall feeling poorly prior to syncope. She denies any chest pain, shortness of breath, numbness, tingling, difficulty speaking, weakness at the present time.  Of note, pt was admitted for similar about a month ago. She had workup including head CT, echo, cycling enzymes, and observation on tele without a definitive cause uncovered. She was started on a new blood pressure medication at that time.  Past Medical History  Diagnosis Date  . Depression     type 2  . Hyperlipidemia   . Osteopenia   . Memory loss, short term 3/09    mild cognitive impairment  . Abnormal CT of the chest 10/08    5 mm nodule- needs f/u CT in 1 year  . Squamous cell carcinoma of leg   . DM type 2 (diabetes mellitus, type 2)   . Dysrhythmia     "irregular at times"  . GERD (gastroesophageal reflux disease)   . Alzheimer disease     "severe" (08/08/2012)    Past Surgical History  Procedure Date  . Total hip arthroplasty ? first OR; 02/2005    right X 2  . Ischemic colitis 10/98    colonoscopy  . Squamous cell carcinoma excision 10/2001    on leg  . Pelvic fracture surgery 07/2002    after a fall  . Cataract extraction, bilateral 2010    . Dexa 11/01    osteopenia  . Vaginal hysterectomy 1972  . Bladder suspension 02/2001    pelvic vaginal sling for incontinence    Family History  Problem Relation Age of Onset  . Stroke Mother   . Dementia Mother   . Stroke Father   . Breast cancer Sister   . Prostate cancer Brother   . Breast cancer Sister   . Memory loss Sister   . Breast cancer Other     neice  . Alzheimer's disease Sister     older sister  . Memory loss Sister     mild    History  Substance Use Topics  . Smoking status: Never Smoker   . Smokeless tobacco: Never Used  . Alcohol Use: No    OB History    Grav Para Term Preterm Abortions TAB SAB Ect Mult Living                  Review of Systems  Constitutional: Negative for fever and chills.  Eyes: Negative for photophobia and visual disturbance.  Respiratory: Negative for chest tightness and shortness of breath.   Cardiovascular: Negative for chest pain and palpitations.  Gastrointestinal: Negative for nausea, vomiting and abdominal pain.  Neurological: Positive for syncope. Negative for dizziness, weakness and headaches.  All other systems reviewed and are negative.  Allergies  Alendronate sodium; Keflex; and Risedronate sodium  Home Medications   Current Outpatient Rx  Name Route Sig Dispense Refill  . AMLODIPINE BESYLATE 5 MG PO TABS Oral Take 1 tablet (5 mg total) by mouth daily. 30 tablet 11  . ASPIRIN 81 MG PO TBEC Oral Take 81 mg by mouth daily.      . ATORVASTATIN CALCIUM 10 MG PO TABS Oral Take 10 mg by mouth at bedtime.     Marland Kitchen CALCITONIN (SALMON) 200 UNIT/ACT NA SOLN Nasal Place 1 spray into the nose daily. 3.7 mL 3  . CALTRATE 600+D PLUS 600-400 MG-UNIT PO TABS Oral Take 1 tablet by mouth 2 (two) times daily.      . DONEPEZIL HCL 10 MG PO TABS Oral Take 10 mg by mouth daily.      Marland Kitchen MEMANTINE HCL 10 MG PO TABS Oral Take 10 mg by mouth 2 (two) times daily.    Marland Kitchen METFORMIN HCL 500 MG PO TABS Oral Take 1 tablet (500 mg total) by  mouth 2 (two) times daily with a meal. 60 tablet 11  . PANTOPRAZOLE SODIUM 40 MG PO TBEC Oral Take 1 tablet (40 mg total) by mouth every morning. 30 tablet 11    BP 121/52  Pulse 79  Temp 97.7 F (36.5 C) (Oral)  Resp 18  SpO2 100%  Physical Exam  Nursing note and vitals reviewed. Constitutional: She appears well-developed and well-nourished. No distress.  HENT:  Head: Normocephalic and atraumatic.  Mouth/Throat: Oropharynx is clear and moist. No oropharyngeal exudate.  Eyes: Conjunctivae normal and EOM are normal. Pupils are equal, round, and reactive to light.  Neck: Normal range of motion.  Cardiovascular: Normal rate, regular rhythm and normal heart sounds.  Exam reveals no gallop and no friction rub.   No murmur heard. Pulmonary/Chest: Effort normal and breath sounds normal. She exhibits no tenderness.  Abdominal: Soft. Bowel sounds are normal. There is no tenderness. There is no rebound and no guarding.  Musculoskeletal: Normal range of motion.  Neurological: She is alert.       Speech clear, pupils equal round reactive to light, extraocular movements intact   Normal peripheral visual fields Cranial nerves III through XII normal including no facial droop Follows commands, moves all extremities x4, normal strength to bilateral upper and lower extremities at all major muscle groups including grip Sensation normal to light touch Coordination intact, no limb ataxia, finger-nose-finger normal Rapid alternating movements normal No pronator drift Gait normal  Skin: Skin is warm and dry. She is not diaphoretic.  Psychiatric: She has a normal mood and affect.       Pt does repeatedly question "did I pass out at church? Was the service still going on?" during H+P    ED Course  Procedures (including critical care time)  Date: 09/16/2012  Rate: 69  Rhythm: normal sinus rhythm  QRS Axis: normal  Intervals: normal  ST/T Wave abnormalities: normal  Conduction Disutrbances:none   Narrative Interpretation: ant q waves, V1-V2  Old EKG Reviewed: none available  Labs Reviewed  BASIC METABOLIC PANEL - Abnormal; Notable for the following:    BUN 26 (*)     GFR calc non Af Amer 48 (*)     GFR calc Af Amer 56 (*)     All other components within normal limits  CBC  TROPONIN I  URINALYSIS, ROUTINE W REFLEX MICROSCOPIC   Dg Chest 2 View  09/16/2012  *RADIOLOGY REPORT*  Clinical Data: Syncope  CHEST -  2 VIEW  Comparison: 08/08/2012  Findings: Chronic interstitial markings. Mild lingular scarring, unchanged.  No pleural effusion or pneumothorax.  Cardiomediastinal silhouette is within normal limits.  Suspected small hiatal hernia.  Mild degenerative changes of the visualized thoracolumbar spine.  IMPRESSION: No evidence of acute cardiopulmonary disease.   Original Report Authenticated By: Charline Bills, M.D.      No diagnosis found. 1) syncope    MDM  Pt presents s/p syncopal event at church. Has no recollection of the event. No associated sx. She was recently admitted for same; had workup which was negative. She was started on new BP med at that time. BP was low initially on EMS arrival and her diastolic BP has been low throughout her ED stay. Her labs are reassuring thus far. ECG nonischemic in appearance. Possible medication reaction. Will admit to cycle enzymes, on tele. Discussed with hospitalist who accepts pt for admission.       Grant Fontana, PA-C 09/16/12 1600

## 2012-09-17 LAB — GLUCOSE, CAPILLARY
Glucose-Capillary: 103 mg/dL — ABNORMAL HIGH (ref 70–99)
Glucose-Capillary: 167 mg/dL — ABNORMAL HIGH (ref 70–99)

## 2012-09-17 NOTE — Evaluation (Signed)
Physical Therapy Evaluation Patient Details Name: [name] MRN: [id number] DOB: 24-Feb-1922 Today's Date: 09/17/2012 Time: 4098-1191 PT Time Calculation (min): 26 min  PT Assessment / Plan / Recommendation Clinical Impression  pt presents with syncope.  pt has good family and CG support at D/C.  Feel pt is near baseline level of function per [name]'s report.  Not further PT needs at this time.      PT Assessment  Patent does not need any further PT services    Follow Up Recommendations  No PT follow up;Supervision/Assistance - 24 hour    Barriers to Discharge        Equipment Recommendations  None recommended by PT    Recommendations for Other Services     Frequency      Precautions / Restrictions Precautions Precautions: None Restrictions Weight Bearing Restrictions: No   Pertinent Vitals/Pain Denies pain.        Mobility  Bed Mobility Bed Mobility: Supine to Sit;Sitting - Scoot to Edge of Bed Supine to Sit: 5: Supervision Sitting - Scoot to Edge of Bed: 5: Supervision Details for Bed Mobility Assistance: pt uses rocking momentum for supine to sit.   Transfers Transfers: Sit to Stand;Stand to Sit Sit to Stand: 4: Min guard;With upper extremity assist;From bed Stand to Sit: 4: Min guard;With upper extremity assist;To chair/3-in-1;With armrests Details for Transfer Assistance: pt demos good technique.   Ambulation/Gait Ambulation/Gait Assistance: 4: Min guard Ambulation Distance (Feet): 100 Feet Assistive device: Rolling walker Ambulation/Gait Assistance Details: cues to stay close to RW.  pt remains flexed posture, but eager to walk.   Gait Pattern: Step-through pattern;Decreased stride length;Trunk flexed Stairs: No Wheelchair Mobility Wheelchair Mobility: No    Shoulder Instructions     Exercises     PT Diagnosis:    PT Problem List:   PT Treatment Interventions:     PT Goals    Visit Information  Last PT Received On: 09/17/12 Assistance Needed:  +1    Subjective Data  Subjective: She lives with her sisters and they have 24hr CGs.  -per [name].   Patient Stated Goal: Home   Prior Functioning  Home Living Lives With: Family Available Help at Discharge: Family;Personal care attendant;Available 24 hours/day Type of Home: House Home Layout: One level Bathroom Shower/Tub: Engineer, manufacturing systems: Standard Bathroom Accessibility: Yes How Accessible: Accessible via walker Home Adaptive Equipment: Bedside commode/3-in-1;Hand-held shower hose;Quad cane;Shower chair with back;Walker - rolling;Wheelchair - manual Prior Function Level of Independence: Needs assistance Needs Assistance: Bathing;Dressing;Toileting;Meal Prep;Light Housekeeping;Gait Bath: Minimal Dressing: Minimal Toileting: Minimal Meal Prep: Total Light Housekeeping: Total Gait Assistance: Uses QC.   Able to Take Stairs?: Yes (with A) Driving: No Vocation: Retired Musician: No difficulties    Cognition  Overall Cognitive Status: History of cognitive impairments - at baseline Arousal/Alertness: Awake/alert Orientation Level: Disoriented to;Time;Situation Behavior During Session: Flat affect    Extremity/Trunk Assessment Right Lower Extremity Assessment RLE ROM/Strength/Tone: Deficits RLE ROM/Strength/Tone Deficits: Generally weak and deconditioned.   RLE Sensation: WFL - Light Touch Left Lower Extremity Assessment LLE ROM/Strength/Tone: Deficits LLE ROM/Strength/Tone Deficits: Generally weak and deconditioned.   LLE Sensation: WFL - Light Touch Trunk Assessment Trunk Assessment: Kyphotic   Balance Balance Balance Assessed: No  End of Session PT - End of Session Equipment Utilized During Treatment: Gait belt Activity Tolerance: Patient tolerated treatment well Patient left: in chair;with call bell/phone within reach;with family/visitor present Nurse Communication: Mobility status  GP     [name],  [place] [phone]  09/17/2012, 11:10 AM

## 2012-09-17 NOTE — Discharge Summary (Signed)
Physician Discharge Summary  [name] UXL:[id number] DOB: 1922-03-02 DOA: 09/16/2012  PCP: Roxy Manns, MD  Admit date: 09/16/2012 Discharge date: 09/17/2012  Recommendations for Outpatient Follow-up:  1. Patient will be set up with a 24-hour Holter monitor from [facility] cardiology office 2. We took the patient off of Norvasc due to hypotension  Discharge Diagnoses:  Active Problems:  DIABETES MELLITUS, TYPE II  HYPERLIPIDEMIA  Essential hypertension, benign  GERD  Syncope  Dementia  Venous insufficiency  UTI (lower urinary tract infection)   Discharge Condition: Good, back to baseline  Diet recommendation: Regular  Filed Weights   09/16/12 1819  Weight: 52.889 kg (116 lb 9.6 oz)    History of present illness:  78-year-old woman admitted for the third syncopal spell  Hospital Course:  1. Syncope - again the cause remained unclear. Patient probably has vagal phenomenon. EKG is normal, echocardiogram is normal. 24-hour telemetry in the hospital was without arrhythmia. There are no appreciable neurological deficits. Plan going forward is to have the patient wear a event monitor. Could also consider discontinuing Namenda or Aricept. 2. mild hypotension prior to discharge-we did  stop Norvasc  Procedures:  none  Consultations:  None  Discharge Exam: Filed Vitals:   09/16/12 1825 09/16/12 1828 09/16/12 2100 09/17/12 0500  BP: 129/70 121/71 132/65 117/64  Pulse: 102 93 77 72  Temp:   98 F (36.7 C) 98.8 F (37.1 C)  TempSrc:   Oral Oral  Resp:   20 18  Height:      Weight:      SpO2:   98% 95%    General: Alert, oriented to family Cardiovascular: Regular rate and rhythm without murmurs rubs or gallops Respiratory: Clear to auscultation bilaterally  Discharge Instructions  Discharge Orders    Future Appointments: Provider: Department: Dept Phone: Center:   12/21/2012 11:15 AM Judy Pimple, MD [facility] [phone] [id number]     Future Orders  Please Complete By Expires   Diet general      Increase activity slowly          Medication List     As of 09/17/2012  9:32 AM    TAKE these medications         amLODipine 5 MG tablet   Commonly known as: NORVASC   Take 1 tablet (5 mg total) by mouth daily.      aspirin 81 MG EC tablet   Take 81 mg by mouth daily.      atorvastatin 10 MG tablet   Commonly known as: LIPITOR   Take 10 mg by mouth at bedtime.      calcitonin (salmon) 200 UNIT/ACT nasal spray   Commonly known as: MIACALCIN/FORTICAL   Place 1 spray into the nose daily.      CALTRATE 600+D PLUS 600-400 MG-UNIT per tablet   Take 1 tablet by mouth 2 (two) times daily.      donepezil 10 MG tablet   Commonly known as: ARICEPT   Take 10 mg by mouth daily.      memantine 10 MG tablet   Commonly known as: NAMENDA   Take 10 mg by mouth 2 (two) times daily.      metFORMIN 500 MG tablet   Commonly known as: GLUCOPHAGE   Take 1 tablet (500 mg total) by mouth 2 (two) times daily with a meal.      pantoprazole 40 MG tablet   Commonly known as: PROTONIX   Take 1 tablet (40 mg total)  by mouth every morning.           Follow-up Information    Follow up with Roxy Manns, MD.   Contact information:   [address] [place] [address] [place] [place] [address] [phone]           The results of significant diagnostics from this hospitalization (including imaging, microbiology, ancillary and laboratory) are listed below for reference.    Significant Diagnostic Studies: Dg Chest 2 View  09/16/2012  *RADIOLOGY REPORT*  Clinical Data: Syncope  CHEST - 2 VIEW  Comparison: 08/08/2012  Findings: Chronic interstitial markings. Mild lingular scarring, unchanged.  No pleural effusion or pneumothorax.  Cardiomediastinal silhouette is within normal limits.  Suspected small hiatal hernia.  Mild degenerative changes of the visualized thoracolumbar spine.  IMPRESSION: No evidence of acute cardiopulmonary disease.    Original Report Authenticated By: Charline Bills, M.D.     Microbiology: No results found for this or any previous visit (from the past 240 hour(s)).   Labs: Basic Metabolic Panel:  Lab 09/16/12 2130  NA 136  K 4.7  CL 98  CO2 24  GLUCOSE 94  BUN 26*  CREATININE 1.01  CALCIUM 10.1  MG --  PHOS --   Liver Function Tests: No results found for this basename: AST:5,ALT:5,ALKPHOS:5,BILITOT:5,PROT:5,ALBUMIN:5 in the last 168 hours No results found for this basename: LIPASE:5,AMYLASE:5 in the last 168 hours No results found for this basename: AMMONIA:5 in the last 168 hours CBC:  Lab 09/16/12 1323  WBC 9.9  NEUTROABS --  HGB 12.4  HCT 38.1  MCV 88.8  PLT 284   Cardiac Enzymes:  Lab 09/16/12 1834 09/16/12 1602 09/16/12 1327  CKTOTAL -- -- --  CKMB -- -- --  CKMBINDEX -- -- --  TROPONINI <0.30 <0.30 <0.30   BNP: BNP (last 3 results) No results found for this basename: PROBNP:3 in the last 8760 hours CBG:  Lab 09/17/12 0754 09/16/12 2049 09/16/12 1933  GLUCAP 103* 186* 187*    Signed:  Tilman Mcclaren  [facility] 09/17/2012, 9:32 AM

## 2012-09-17 NOTE — Progress Notes (Signed)
Called Dr. Lavera Guise about patient's BP of 96/50, he stated to not give Po Norvasc.

## 2012-09-18 NOTE — Progress Notes (Signed)
Physical Therapy Note  Late entry for G-Codes.     10-09-12 1005  PT Visit Information  Last PT Received On 2012-10-08  PT G-Codes **NOT FOR INPATIENT CLASS**  Functional Assessment Tool Used Clincal Judgement  Functional Limitation Mobility: Walking and moving around  Mobility: Walking and Moving Around Current Status [phone]) CI  Mobility: Walking and Moving Around Goal Status [phone]) CI  Mobility: Walking and Moving Around Discharge Status [phone]) CI    Mack Hook, PT [phone]

## 2012-09-18 NOTE — Progress Notes (Signed)
Utilization review complete 

## 2012-09-24 ENCOUNTER — Encounter: Payer: Self-pay | Admitting: Family Medicine

## 2012-09-24 ENCOUNTER — Ambulatory Visit (INDEPENDENT_AMBULATORY_CARE_PROVIDER_SITE_OTHER): Payer: Medicare Other | Admitting: Family Medicine

## 2012-09-24 VITALS — BP 114/56 | HR 84 | Temp 97.8°F | Ht 61.0 in | Wt 118.8 lb

## 2012-09-24 DIAGNOSIS — F0390 Unspecified dementia without behavioral disturbance: Secondary | ICD-10-CM

## 2012-09-24 DIAGNOSIS — I10 Essential (primary) hypertension: Secondary | ICD-10-CM

## 2012-09-24 DIAGNOSIS — R55 Syncope and collapse: Secondary | ICD-10-CM

## 2012-09-24 DIAGNOSIS — Z23 Encounter for immunization: Secondary | ICD-10-CM

## 2012-09-24 NOTE — Patient Instructions (Addendum)
We will refer you for cardiology visit at check out  Stay off the amlodipine Flu shot today  Continue the reduced dose of aricept  Continue cane or walker to prevent falls - I like the walker better

## 2012-09-24 NOTE — Progress Notes (Signed)
Subjective:    Patient ID: [name], female    DOB: [birth year], 78 y.o.   MRN: [id number]  HPI  Here for f/u of hosp from 9/29-9/30 for syncopal episode at church Had nl w/u in hosp (records rev) incl EKG/ echo/ 24 h telemetry/ cxr/ ua and labs  Nl neuro exam Did have low bp upon admit- so norvasc was held  Was sched for holter with her cardiologist- was supposed to set her up - we will do that    Admitting doc brought up poss of syncopal risk with aricept- so [name] took her dose down to 5 mg   bp is stable today  No cp or palpitations or headaches or edema  No side effects to medicines  BP Readings from Last 3 Encounters:  09/24/12 114/56  09/17/12 96/50  08/17/12 126/68    At home goes up once in a while - up to 150s systolic - but typically is lower  Has dental exam this fri- takes proph abx  One time she had syncope with it / one time without- did not know if she should change drug Is due to a joint repl  Patient Active Problem List  Diagnosis  . DIABETES MELLITUS, TYPE II  . HYPERLIPIDEMIA  . Essential hypertension, benign  . GERD  . OVERACTIVE BLADDER  . DEGENERATIVE JOINT DISEASE  . OSTEOPOROSIS NOS  . INCONTINENCE, URGE  . Syncope  . Dementia  . Abnormal EKG  . Venous insufficiency  . Abrasion of arm, left  . UTI (lower urinary tract infection)   Past Medical History  Diagnosis Date  . Depression     type 2  . Hyperlipidemia   . Osteopenia   . Memory loss, short term 3/09    mild cognitive impairment  . Abnormal CT of the chest 10/08    5 mm nodule- needs f/u CT in 1 year  . Squamous cell carcinoma of leg   . DM type 2 (diabetes mellitus, type 2)   . Dysrhythmia     "irregular at times"  . GERD (gastroesophageal reflux disease)   . Alzheimer disease     "severe" (08/08/2012)   Past Surgical History  Procedure Date  . Total hip arthroplasty ? first OR; 02/2005    right X 2  . Ischemic colitis 10/98    colonoscopy  . Squamous cell  carcinoma excision 10/2001    on leg  . Pelvic fracture surgery 07/2002    after a fall  . Cataract extraction, bilateral 2010  . Dexa 11/01    osteopenia  . Vaginal hysterectomy 1972  . Bladder suspension 02/2001    pelvic vaginal sling for incontinence   History  Substance Use Topics  . Smoking status: Never Smoker   . Smokeless tobacco: Never Used  . Alcohol Use: No   Family History  Problem Relation Age of Onset  . Stroke Mother   . Dementia Mother   . Stroke Father   . Breast cancer Sister   . Prostate cancer Brother   . Breast cancer Sister   . Memory loss Sister   . Breast cancer Other     neice  . Alzheimer's disease Sister     older sister  . Memory loss Sister     mild   Allergies  Allergen Reactions  . Alendronate Sodium     REACTION: GI; "don't really remember reaction"  . Risedronate Sodium     REACTION: GI; "don't really remember  reaction"   Current Outpatient Prescriptions on File Prior to Visit  Medication Sig Dispense Refill  . aspirin 81 MG EC tablet Take 81 mg by mouth daily.        Marland Kitchen atorvastatin (LIPITOR) 10 MG tablet Take 10 mg by mouth at bedtime.       . calcitonin, salmon, (MIACALCIN/FORTICAL) 200 UNIT/ACT nasal spray Place 1 spray into the nose daily.  3.7 mL  3  . Calcium Carbonate-Vit D-Min (CALTRATE 600+D PLUS) 600-400 MG-UNIT per tablet Take 1 tablet by mouth 2 (two) times daily.        Marland Kitchen donepezil (ARICEPT) 10 MG tablet Take 5 mg by mouth daily.       . memantine (NAMENDA) 10 MG tablet Take 10 mg by mouth 2 (two) times daily.      . metFORMIN (GLUCOPHAGE) 500 MG tablet Take 1 tablet (500 mg total) by mouth 2 (two) times daily with a meal.  60 tablet  11  . pantoprazole (PROTONIX) 40 MG tablet Take 1 tablet (40 mg total) by mouth every morning.  30 tablet  11          Review of Systems Review of Systems  Constitutional: Negative for fever, appetite change, fatigue and unexpected weight change.  Eyes: Negative for pain and visual  disturbance.  Respiratory: Negative for cough and shortness of breath.   Cardiovascular: Negative for cp or palpitations    Gastrointestinal: Negative for nausea, diarrhea and constipation.  Genitourinary: Negative for urgency and frequency.  Skin: Negative for pallor or rash   Neurological: Negative for weakness, light-headedness, numbness and headaches.  Hematological: Negative for adenopathy. Does not bruise/bleed easily.  Psychiatric/Behavioral: Negative for dysphoric mood. The patient is not nervous/anxious.  pos for dementia with loss of short term memory       Objective:   Physical Exam  Constitutional: She appears well-developed and well-nourished. No distress.  HENT:  Head: Normocephalic and atraumatic.  Mouth/Throat: Oropharynx is clear and moist.  Eyes: Conjunctivae normal and EOM are normal. Pupils are equal, round, and reactive to light. No scleral icterus.  Neck: Normal range of motion. Neck supple. No JVD present. Carotid bruit is not present. No thyromegaly present.  Cardiovascular: Normal rate, regular rhythm, normal heart sounds and intact distal pulses.  Exam reveals no gallop.   Pulmonary/Chest: Effort normal and breath sounds normal. No respiratory distress. She has no wheezes.  Abdominal: Soft. Bowel sounds are normal. She exhibits no distension, no abdominal bruit and no mass. There is no tenderness.  Musculoskeletal: She exhibits no edema and no tenderness.  Lymphadenopathy:    She has no cervical adenopathy.  Neurological: She is alert. She has normal reflexes. No cranial nerve deficit. She exhibits normal muscle tone. Coordination normal.       Gait is slow and wide based with assistance  Skin: Skin is warm and dry. No rash noted. No erythema. No pallor.  Psychiatric:       Dementia Timid/ quiet/ innatentive but pleasant          Assessment & Plan:

## 2012-09-25 ENCOUNTER — Encounter: Payer: Self-pay | Admitting: Cardiology

## 2012-09-25 ENCOUNTER — Ambulatory Visit (INDEPENDENT_AMBULATORY_CARE_PROVIDER_SITE_OTHER): Payer: Medicare Other | Admitting: Cardiology

## 2012-09-25 VITALS — BP 116/62 | HR 99 | Ht 61.0 in | Wt 118.8 lb

## 2012-09-25 DIAGNOSIS — E785 Hyperlipidemia, unspecified: Secondary | ICD-10-CM

## 2012-09-25 DIAGNOSIS — E119 Type 2 diabetes mellitus without complications: Secondary | ICD-10-CM

## 2012-09-25 DIAGNOSIS — R55 Syncope and collapse: Secondary | ICD-10-CM

## 2012-09-25 DIAGNOSIS — I10 Essential (primary) hypertension: Secondary | ICD-10-CM

## 2012-09-25 NOTE — Patient Instructions (Addendum)
Continue current medications as listed.  Your physician has recommended that you wear an event monitor for 21 days. Event monitors are medical devices that record the heart's electrical activity. Doctors most often [place] these monitors to diagnose arrhythmias. Arrhythmias are problems with the speed or rhythm of the heartbeat. The monitor is a small, portable device. You can wear one while you do your normal daily activities. This is usually used to diagnose what is causing palpitations/syncope (passing out).  Follow up after testing with Dr Antoine Poche or PA/NP.

## 2012-09-25 NOTE — Progress Notes (Signed)
HPI The patient presents for evaluation of syncopal episodes. She had a near syncopal episode in May. She had her first frank syncopal episode in August. She was seated at the breakfast table and lost consciousness. She had to be helped to the floor. There was no loss of bowel or bladder with this seizure activity. She has advanced dementia and can't give any of the details though her sister who was with her said there did not seem to be any advance warning. She was in the hospital and I reviewed these records. Echo was unremarkable. Telemetry demonstrated no abnormalities. She ruled out. She had a similar presentation on September 29 of her review these records as well. This time she was seated at church. She is frail and gets around with a cane because of gait disturbance. However, there's been no suggestion of chest discomfort, neck or arm discomfort. She doesn't appear to have shortness of breath, PND or orthopnea. She's never mentioned any palpitations, presyncope or syncope. She's never described orthostatic symptoms.  Allergies  Allergen Reactions  . Alendronate Sodium     REACTION: GI; "don't really remember reaction"  . Risedronate Sodium     REACTION: GI; "don't really remember reaction"    Current Outpatient Prescriptions  Medication Sig Dispense Refill  . aspirin 81 MG EC tablet Take 81 mg by mouth daily.        Marland Kitchen atorvastatin (LIPITOR) 10 MG tablet Take 10 mg by mouth at bedtime.       . calcitonin, salmon, (MIACALCIN/FORTICAL) 200 UNIT/ACT nasal spray Place 1 spray into the nose daily.  3.7 mL  3  . Calcium Carbonate-Vit D-Min (CALTRATE 600+D PLUS) 600-400 MG-UNIT per tablet Take 1 tablet by mouth 2 (two) times daily.        Marland Kitchen donepezil (ARICEPT) 10 MG tablet Take 5 mg by mouth daily.       . memantine (NAMENDA) 10 MG tablet Take 10 mg by mouth 2 (two) times daily.      . metFORMIN (GLUCOPHAGE) 500 MG tablet Take 1 tablet (500 mg total) by mouth 2 (two) times daily with a meal.   60 tablet  11  . pantoprazole (PROTONIX) 40 MG tablet Take 1 tablet (40 mg total) by mouth every morning.  30 tablet  11    Past Medical History  Diagnosis Date  . Depression     type 2  . Hyperlipidemia   . Osteopenia   . Memory loss, short term 3/09    mild cognitive impairment  . Abnormal CT of the chest 10/08    5 mm nodule- needs f/u CT in 1 year  . Squamous cell carcinoma of leg   . DM type 2 (diabetes mellitus, type 2)   . Dysrhythmia     "irregular at times"  . GERD (gastroesophageal reflux disease)   . Alzheimer disease     "severe" (08/08/2012)    Past Surgical History  Procedure Date  . Total hip arthroplasty ? first OR; 02/2005    right X 2  . Ischemic colitis 10/98    colonoscopy  . Squamous cell carcinoma excision 10/2001    on leg  . Pelvic fracture surgery 07/2002    after a fall  . Cataract extraction, bilateral 2010  . Dexa 11/01    osteopenia  . Vaginal hysterectomy 1972  . Bladder suspension 02/2001    pelvic vaginal sling for incontinence    Family History  Problem Relation Age of Onset  .  Stroke Mother   . Dementia Mother   . Stroke Father   . Breast cancer Sister   . Prostate cancer Brother   . Breast cancer Sister   . Memory loss Sister   . Breast cancer Other     neice  . Alzheimer's disease Sister     older sister  . Memory loss Sister     mild    History   Social History  . Marital Status: Single    Spouse Name: N/A    Number of Children: 0  . Years of Education: N/A   Occupational History  . Retired Engineer, site    Social History Main Topics  . Smoking status: Never Smoker   . Smokeless tobacco: Never Used  . Alcohol Use: No  . Drug Use: No  . Sexually Active: Not on file   Other Topics Concern  . Not on file   Social History Narrative   Lives with her sister who helps care for her (and another sister).Involved in church activities    ROS:  Positive for reflux.  Othwerise asnegative for all other  systems.   PHYSICAL EXAM BP 116/62  Pulse 99  Ht 5\' 1"  (1.549 m)  Wt 53.887 kg (118 lb 12.8 oz)  BMI 22.45 kg/m2 GENERAL:  Frail appearing HEENT:  Pupils equal round and reactive, fundi not visualized, oral mucosa unremarkable NECK:  No jugular venous distention, waveform within normal limits, carotid upstroke brisk and symmetric, no bruits, no thyromegaly LYMPHATICS:  No cervical, inguinal adenopathy LUNGS:  Clear to auscultation bilaterally BACK:  No CVA tenderness, lordosis CHEST:  Unremarkable HEART:  PMI not displaced or sustained,S1 and S2 within normal limits, no S3, no S4, no clicks, no rubs, no murmurs ABD:  Flat, positive bowel sounds normal in frequency in pitch, no bruits, no rebound, no guarding, no midline pulsatile mass, no hepatomegaly, no splenomegaly EXT:  2 plus pulses throughout, no edema, no cyanosis no clubbing, muscle wasting expected for age SKIN:  No rashes no nodules NEURO:  Cranial nerves II through XII grossly intact, motor grossly intact throughout PSYCH:  Pleasant but with severe dementia  EKG:  09/16/12   sinus rhythm, rate 72, poor anterior R wave progression suggestive of old anteroseptal infarct, no acute ST-T wave changes.  ASSESSMENT AND PLAN  Syncope - I will start with a 21 day event monitor. She's already had an echo, TSH and overnight monitoring a couple of times without clear etiology.  Dementia - This is apparently severe and does compromise the workup somewhat. However, I think she and her family will be or handle an event monitor.

## 2012-09-27 NOTE — Assessment & Plan Note (Signed)
This has been slowly progressive Sees neuro Recently cut aircept for the poss of syncope side eff Will continue to monitor Neuro and hosp notes rev in detail today

## 2012-09-27 NOTE — Assessment & Plan Note (Signed)
bp stable off amlodipine No orthostasis Will stay off this in light of syncope

## 2012-09-27 NOTE — Assessment & Plan Note (Signed)
Poss multifactorial  Ref to cardiol for eval and likely holter monitor  Disc change pos slowly  Has 24 hour care Disc fall precautions in detail  Also continues to hold amlodipine Neuro did cut her aricept due to syncope risk with that as well

## 2012-09-28 ENCOUNTER — Telehealth: Payer: Self-pay | Admitting: Cardiology

## 2012-09-28 NOTE — Telephone Encounter (Signed)
Attempted  to have pt scheduled for monitor to be placed today however none are available.  Pt has been scheduled for Monday.  Pt's family was asked to call 911 if pt becomes syncopal again.  They had been instructed the the time of the office visit to do this as well.  Family was upset that they felt this matter was urgent and they were not called back in a timely manner.  Again instructed them to call 911 if she has an episode that needs emergent care.

## 2012-09-28 NOTE — Telephone Encounter (Signed)
New Problem:    Please ask for [name] at the home number or the caregiver [place].

## 2012-10-01 ENCOUNTER — Encounter (INDEPENDENT_AMBULATORY_CARE_PROVIDER_SITE_OTHER): Payer: Medicare Other

## 2012-10-01 DIAGNOSIS — R55 Syncope and collapse: Secondary | ICD-10-CM

## 2012-11-07 ENCOUNTER — Telehealth: Payer: Self-pay | Admitting: Cardiology

## 2012-11-07 NOTE — Telephone Encounter (Signed)
plz return call to pt sister Carney Bern [phone] regarding pt monitor results

## 2012-11-07 NOTE — Telephone Encounter (Signed)
[name] aware of results.  Hasn't had any more passing out spells.  [name] has decrease her medications and seems to have helped.

## 2012-11-19 ENCOUNTER — Other Ambulatory Visit: Payer: Self-pay | Admitting: Family Medicine

## 2012-12-21 ENCOUNTER — Encounter: Payer: Self-pay | Admitting: Family Medicine

## 2012-12-21 ENCOUNTER — Ambulatory Visit (INDEPENDENT_AMBULATORY_CARE_PROVIDER_SITE_OTHER): Payer: Medicare Other | Admitting: Family Medicine

## 2012-12-21 VITALS — BP 132/64 | HR 78 | Temp 97.5°F | Ht 61.0 in | Wt 118.5 lb

## 2012-12-21 DIAGNOSIS — I10 Essential (primary) hypertension: Secondary | ICD-10-CM

## 2012-12-21 DIAGNOSIS — E119 Type 2 diabetes mellitus without complications: Secondary | ICD-10-CM

## 2012-12-21 DIAGNOSIS — E785 Hyperlipidemia, unspecified: Secondary | ICD-10-CM

## 2012-12-21 LAB — LIPID PANEL
HDL: 51.7 mg/dL (ref >39.00)
LDL Cholesterol: 75 mg/dL (ref 0–99)
Total CHOL/HDL Ratio: 3
VLDL: 22.6 mg/dL (ref 0.0–40.0)

## 2012-12-21 LAB — COMPREHENSIVE METABOLIC PANEL
ALT: 10 U/L (ref 0–35)
AST: 19 U/L (ref 0–37)
Alkaline Phosphatase: 56 U/L (ref 39–117)
CO2: 26 mEq/L (ref 19–32)
Sodium: 136 mEq/L (ref 135–145)
Total Bilirubin: 0.8 mg/dL (ref 0.3–1.2)
Total Protein: 6.7 g/dL (ref 6.0–8.3)

## 2012-12-21 LAB — MICROALBUMIN / CREATININE URINE RATIO: Microalb, Ur: 2.7 mg/dL — ABNORMAL HIGH (ref 0.0–1.9)

## 2012-12-21 MED ORDER — CALCITONIN (SALMON) 200 UNIT/ACT NA SOLN: 1.0000 | Freq: Every day | NASAL | Status: DC

## 2012-12-21 MED ORDER — ATORVASTATIN CALCIUM 10 MG PO TABS: 10.0000 mg | ORAL_TABLET | Freq: Every day | ORAL | Status: DC

## 2012-12-21 NOTE — Assessment & Plan Note (Signed)
Due for labs on lipitor and diet  Rev low sat fat diet  Happy she is mt her wt

## 2012-12-21 NOTE — Assessment & Plan Note (Signed)
bp in fair control at this time  No changes needed -ok without meds at this time and will not start any due to hx of syncope Disc lifstyle change with low sodium diet and exercise

## 2012-12-21 NOTE — Progress Notes (Signed)
Subjective:    Patient ID: [name], female    DOB: [birth year], 79 y.o.   MRN: [id number]  HPI Here for f/u of chronic conditions  Is doing ok overall  Nothing new going on  Dementia progresses   bp is stable today  No cp or palpitations or headaches or edema  No longer on medicine- had syncope  BP Readings from Last 3 Encounters:  12/21/12 132/64  09/25/12 116/62  09/24/12 114/56     Diabetes Home sugar results - have been fine (90s-low 100s fasting) Wt is stable - is eating regularly DM diet - very good  Exercise -limited  Symptoms-none  A1C last  Lab Results  Component Value Date   HGBA1C 6.5* 08/08/2012    No problems with medications , no hypoglycemia  Renal protection-cannot have ace due to low bp with various meds and syncope  Last eye exam - is about due - has appt soon    Hyperlipidemia Lab Results  Component Value Date   CHOL 154 07/20/2011   CHOL 144 01/20/2011   CHOL 145 07/08/2010   Lab Results  Component Value Date   HDL 57.70 07/20/2011   HDL 52.10 01/20/2011   HDL 54.10 07/08/2010   Lab Results  Component Value Date   LDLCALC 81 07/20/2011   LDLCALC 78 01/20/2011   LDLCALC 75 07/08/2010   Lab Results  Component Value Date   TRIG 76.0 07/20/2011   TRIG 72.0 01/20/2011   TRIG 80.0 07/08/2010   Lab Results  Component Value Date   CHOLHDL 3 07/20/2011   CHOLHDL 3 01/20/2011   CHOLHDL 3 07/08/2010   No results found for this basename: LDLDIRECT     Review of Systems Review of Systems  Constitutional: Negative for fever, appetite change, fatigue and unexpected weight change.  Eyes: Negative for pain and visual disturbance.  Respiratory: Negative for cough and shortness of breath.   Cardiovascular: Negative for cp or palpitations    Gastrointestinal: Negative for nausea, diarrhea and constipation.  Genitourinary: Negative for urgency and frequency.  Skin: Negative for pallor or rash   Neurological: Negative for weakness, light-headedness, numbness and  headaches. pos for generally poor balance for which she uses a walker MSK pos for slow gait due to OA Hematological: Negative for adenopathy. Does not bruise/bleed easily.  Psychiatric/Behavioral: Negative for dysphoric mood. The patient is not nervous/anxious.  pos for poor short term memory       Objective:   Physical Exam  Constitutional: She appears well-developed and well-nourished. No distress.       Frail app elderly female in no distress  HENT:  Head: Normocephalic and atraumatic.  Mouth/Throat: Oropharynx is clear and moist.  Eyes: Conjunctivae normal and EOM are normal. Pupils are equal, round, and reactive to light. Right eye exhibits no discharge. Left eye exhibits no discharge. No scleral icterus.  Neck: Normal range of motion. Neck supple. No JVD present. Carotid bruit is not present. No thyromegaly present.  Cardiovascular: Normal rate, regular rhythm, normal heart sounds and intact distal pulses.  Exam reveals no gallop.   Pulmonary/Chest: Effort normal and breath sounds normal. No respiratory distress. She has no wheezes.  Abdominal: Soft. Bowel sounds are normal. She exhibits no distension, no abdominal bruit and no mass. There is no tenderness.  Musculoskeletal: She exhibits no edema and no tenderness.       Varicosities noted  Lymphadenopathy:    She has no cervical adenopathy.  Neurological: She is alert. She has  normal reflexes. No cranial nerve deficit. She exhibits normal muscle tone. Coordination normal.  Skin: Skin is warm and dry. No rash noted. No erythema. No pallor.  Psychiatric: She has a normal mood and affect.       Mood is good  Dementia present with poor short term memory Somewhat timid personality          Assessment & Plan:

## 2012-12-21 NOTE — Assessment & Plan Note (Signed)
a1c today Suspect stable No hypoglycemia  Urged to be active with walker microalb also -cannot take ace

## 2012-12-21 NOTE — Patient Instructions (Addendum)
Labs today  Keep watching diet and stay as active  Let me know if any problems  I prefer a walker to a cane for stability/ balance  Follow up with me in 6 months for check up with labs prior

## 2012-12-24 ENCOUNTER — Encounter: Payer: Self-pay | Admitting: *Deleted

## 2012-12-31 ENCOUNTER — Encounter: Payer: Self-pay | Admitting: Family Medicine

## 2013-01-17 ENCOUNTER — Other Ambulatory Visit: Payer: Self-pay | Admitting: Family Medicine

## 2013-02-06 ENCOUNTER — Other Ambulatory Visit: Payer: Self-pay | Admitting: Family Medicine

## 2013-03-07 ENCOUNTER — Other Ambulatory Visit: Payer: Self-pay | Admitting: *Deleted

## 2013-03-07 MED ORDER — GLUCOSE BLOOD VI STRP: ORAL_STRIP | Status: AC

## 2013-03-15 ENCOUNTER — Ambulatory Visit (INDEPENDENT_AMBULATORY_CARE_PROVIDER_SITE_OTHER): Payer: Medicare Other | Admitting: Neurology

## 2013-03-15 VITALS — BP 122/60 | HR 82 | Temp 97.1°F | Ht 61.0 in

## 2013-03-15 DIAGNOSIS — F0390 Unspecified dementia without behavioral disturbance: Secondary | ICD-10-CM

## 2013-03-15 NOTE — Progress Notes (Signed)
Subjective:    Patient ID: [name] is a 79 y.o. female.  HPI  Interim history:   [name] is a very pleasant 79 year old right-handed woman who presents for followup consultation of her memory loss. She is accompanied by her sister and her niece today. She is a former patient of Dr. Imagene Gurney and was last seen by him on 09/21/2012 at which time he felt that she had improved on Namenda. He did decrease her donezipil because of a history of syncope. She is currently on a combination of Namenda 10 mg twice daily, temazepam 5 mg daily, baby aspirin, Protonix, calcium supplement, Lipitor 10 mg daily, metformin 500 mg twice daily. I reviewed Dr. Imagene Gurney prior notes and the patient's records and below is a summary of that review:  79 year old RH single female who lives with her two sisters. She was first seen by Dr. Sandria Manly on /28/2009 with memory loss beginning in 2008 or before. She has short-term episodic memory loss beginning with conversations and in taking her medications  She  also has procedural memory loss, not knowing how to use the TV remote. 2 other sisters and her brother have dementia. Initial MMSE was 27/30, CDT 4/4, then on 05/06/2009 MMSE 28/30, CDT 4/4, AFT 8. On 09/23/2009 = MMSE 28/30, CDT 4/4. On 10/14/10 = MMSE 26/30, CDT 4/4, AFT 12. Examination was remarkable for severe kyphosis and  peripheral neuropathy. Workup for treatable causes of memory loss was negative including MRI study of the brain, TSH, B12, sedimentation rate, folate, and CBC. She is sleeping well. She no longer dresses herself, or takes care of her toilet needs. She feeds herself. They have a CNA 24/7. She has been accepted for  long-term care insurance benefits. She denies depression, and there is no report of halluciation, delusions, outbursts or agitation. She does not exercise. She reads and enjoys television but is reading less than she used to. She does not cook or clean. She makes her bed. She attends church women  activities and senior citizens group activities and a "friend's and [employer] officer. She has bladder incontinence but no bowel incontinence. She uses depends. She does not fall, but her gait is very unsteady.  Falls assessment tool score is 17. She does not have bizarre dreams. On 10/19/11 = MMSE 26/30, CDT 4/4, AFT 8, Geriatric depression scale 2/15.   04/12/2012 = MMSE 15/30, CDT 3/4, AFT 6. The patient passed out on 8/21/213 at the breakfast table without tonic-clonic activity. She was seen at  [facility] and thought to be dehydrated with elevated WBC and urinary tract infection. She passed out again on 09/16/2012 at church in the sitting position without tonic-clonic activity or urinary incontinence.She had no warning that she was going to pass out with either incident.  There have been no other new changes. On 09/21/2012 = MMSE 22/30, CDT 4/4, AFT 7.   The patient is situated in a hospital Wheelchair and they brought in her walker. She is not able to provide much in the way of history. Her history is provided by her sister, age 38 and her niece. They report no recent or new issues. Her sister states, that the patient is "very sweet" and does not cause them any trouble. She has not had any syncope since 9/13 and has no history of hallucinations, delusions, anger outbursts, violence, or agitation.   Her Past Medical History Is Significant For: Past Medical History  Diagnosis Date  . Depression     type  2  . Hyperlipidemia   . Osteopenia   . Memory loss, short term 3/09    mild cognitive impairment  . Abnormal CT of the chest 10/08    5 mm nodule- needs f/u CT in 1 year  . Squamous cell carcinoma of leg   . DM type 2 (diabetes mellitus, type 2)   . Dysrhythmia     "irregular at times"  . GERD (gastroesophageal reflux disease)   . Alzheimer disease     "severe" (08/08/2012)    Her Past Surgical History Is Significant For: Past Surgical History  Procedure Laterality Date  . Total  hip arthroplasty  ? first OR; 02/2005    right X 2  . Ischemic colitis  10/98    colonoscopy  . Squamous cell carcinoma excision  10/2001    on leg  . Pelvic fracture surgery  07/2002    after a fall  . Cataract extraction, bilateral  2010  . Dexa  11/01    osteopenia  . Vaginal hysterectomy  1972  . Bladder suspension  02/2001    pelvic vaginal sling for incontinence    Her Family History Is Significant For: Family History  Problem Relation Age of Onset  . Stroke Mother   . Dementia Mother   . Stroke Father   . Breast cancer Sister   . Prostate cancer Brother   . Breast cancer Sister   . Memory loss Sister   . Breast cancer Other     neice  . Alzheimer's disease Sister     older sister  . Memory loss Sister     mild    Her Social History Is Significant For: History   Social History  . Marital Status: Single    Spouse Name: N/A    Number of Children: 0  . Years of Education: N/A   Occupational History  . Retired Engineer, site    Social History Main Topics  . Smoking status: Never Smoker   . Smokeless tobacco: Never Used  . Alcohol Use: No  . Drug Use: No  . Sexually Active: Not on file   Other Topics Concern  . Not on file   Social History Narrative   Lives with her sister who helps care for her (and another sister).   Involved in church activities    Her Allergies Are:  Allergies  Allergen Reactions  . Alendronate Sodium     REACTION: GI; "don't really remember reaction"  . Risedronate Sodium     REACTION: GI; "don't really remember reaction"  :   Her Current Medications Are:  Outpatient Encounter Prescriptions as of 03/15/2013  Medication Sig Dispense Refill  . aspirin 81 MG EC tablet Take 81 mg by mouth daily.        Marland Kitchen atorvastatin (LIPITOR) 10 MG tablet Take 1 tablet (10 mg total) by mouth at bedtime.  30 tablet  11  . calcitonin, salmon, (MIACALCIN/FORTICAL) 200 UNIT/ACT nasal spray Place 1 spray into the nose daily.  3.7 mL  11  .  Calcium Carbonate-Vit D-Min (CALTRATE 600+D PLUS) 600-400 MG-UNIT per tablet Take 1 tablet by mouth 2 (two) times daily.        Marland Kitchen donepezil (ARICEPT) 10 MG tablet Take 5 mg by mouth daily.       Marland Kitchen glucose blood test strip Use to check blood sugar daily and as directed. Dx 250.00  100 each  6  . memantine (NAMENDA) 10 MG tablet Take 10 mg by mouth  2 (two) times daily.      . metFORMIN (GLUCOPHAGE) 500 MG tablet TAKE ONE TABLET TWICE A DAY WITH MEALS.  60 tablet  4  . pantoprazole (PROTONIX) 40 MG tablet TAKE ONE TABLET BY MOUTH EACH MORNING  30 tablet  4   No facility-administered encounter medications on file as of 03/15/2013.  :  Review of Systems  Constitutional: Positive for fatigue.  HENT: Positive for rhinorrhea.   Respiratory: Positive for cough.        Snoring  Gastrointestinal:       Incontinence  Endocrine: Positive for cold intolerance.  Genitourinary:       Incontinence  Musculoskeletal: Positive for myalgias.  Neurological: Positive for syncope and weakness.       Memory loss  Psychiatric/Behavioral: Positive for confusion.    Objective:  Neurologic Exam  Physical Exam  Physical Examination:   Filed Vitals:   03/15/13 1012  BP: 122/60  Pulse: 82  Temp: 97.1 F (36.2 C)    General Examination: The patient is a very pleasant 79 y.o. female in no acute distress.  HEENT: Normocephalic, atraumatic, pupils are equal, round and reactive to light and accommodation. Extraocular tracking is fair without nystagmus noted. Hearing is grossly intact. Face is symmetric with normal facial animation and normal facial sensation. Speech is clear with no dysarthria noted. There is mild hypophonia. There is no lip, neck or jaw tremor. Neck is supple with full range of motion. Oropharynx exam reveals normal findings. No significant airway crowding is noted. Mallampati is class II. Tongue protrudes centrally and palate elevates symmetrically.    Chest: is clear to auscultation  without wheezing, rhonchi or crackles noted.  Heart: sounds are regular and normal without murmurs, rubs or gallops noted.   Abdomen: is soft, non-tender and non-distended with normal bowel sounds appreciated on auscultation.  Extremities: There is no pitting edema in the distal lower extremities bilaterally. Pedal pulses are intact.  Skin: is warm and dry with no trophic changes noted.  Musculoskeletal: exam reveals no obvious joint deformities, tenderness or joint swelling or erythema.  Neurologically:  Mental status: The patient is awake, but does not always pay full attention. She is calm and cooperative. She is oriented to self only. Her memory, attention, language and knowledge are impaired. There is slowness in thinking. Speech is scarce, but clear. Affect is normal. Cranial nerves are as described above under HEENT exam. In addition, shoulder shrug is normal with equal shoulder height noted. Motor exam: Normal bulk, strength and tone is noted for age. There is no drift, tremor or rebound. Romberg is negative. Reflexes are 2+ throughout. Fine motor skills are mildly impaired.   Cerebellar testing shows no dysmetria or intention tremo. There is no truncal or gait ataxia.  Sensory exam is intact to light touch throughout.   Gait, station and balance: she needs help standing. She walks with a 2 wheeled walker. Posture is mild to moderately stooped.    Assessment and Plan:   Assessment and Plan:  In summary, [name] is a very pleasant 79 y.o.-year old female with a history of advanced Alzheimers disease. She has remained fairly stable. She has a family Hx of memory loss. I had a long chat with the patient and particularly her sister and niece today about my findings and the diagnosis, its prognosis and treatment options. We talked about medical treatments and non-pharmacological approaches. We talked about trying to maintaining a healthy lifestyle in general. I encouraged them to  continue with healthy and balanced meals, keeping a regular routine for meals, naps, outings and to keep a scheduled bedtime and wake time routine, to not skip any meals and eat healthy snacks in between meals and to have protein with every meal. They can try to have her drink Ensure in between meals.  I recommended no changes in her medications and they did not need refills today. I answered all their questions today and the patient and her family were in agreement with the plan. I would like to see her back in 6 months, sooner if the need arises and encouraged them to call with any interim questions, concerns, problems or updates and refill requests.

## 2013-03-15 NOTE — Patient Instructions (Addendum)
I think overall you are doing fairly well and are stable at this point.   I do have some generic suggestions for you today:   Please make sure that you drink plenty of fluids. I would like for you to exercise daily for example in the form of walking every day, if you can, with assistance. Please keep a regular sleep-wake schedule, keep regular meal times, do not skip any meals, eat  healthy snacks in between meals, such as fruit or nuts. Try to eat protein with every meal. You can try Ensure supplements as well if you like the taste.   Engage in social activities in your community and with your family and try to keep up with current events by reading the newspaper or watching the news.  I do not think we need to make any changes in your medications at this point. I think you're stable enough that I can see you back in 6 months, sooner if we need to. Please call us if you have any interim questions, concerns, or problems or updates to need to discuss.  Brett Canales is my clinical assistant and will answer any of your questions and relay your messages to me and will give you my messages.   Our phone number is [phone]. We also have an after hours call service for urgent matters and there is a physician on-call for urgent questions. For any emergencies you know to call 911 or go to the nearest emergency room.

## 2013-04-23 ENCOUNTER — Telehealth: Payer: Self-pay | Admitting: Neurology

## 2013-05-28 ENCOUNTER — Telehealth: Payer: Self-pay | Admitting: Family Medicine

## 2013-05-28 ENCOUNTER — Ambulatory Visit (INDEPENDENT_AMBULATORY_CARE_PROVIDER_SITE_OTHER): Payer: Medicare Other | Admitting: Family Medicine

## 2013-05-28 ENCOUNTER — Encounter: Payer: Self-pay | Admitting: Family Medicine

## 2013-05-28 VITALS — BP 108/64 | HR 90 | Temp 98.4°F | Ht 61.0 in

## 2013-05-28 DIAGNOSIS — J209 Acute bronchitis, unspecified: Secondary | ICD-10-CM | POA: Insufficient documentation

## 2013-05-28 MED ORDER — AZITHROMYCIN 250 MG PO TABS: ORAL_TABLET | ORAL | Status: DC

## 2013-05-28 NOTE — Telephone Encounter (Signed)
appt scheduled for today at 3:00 pm

## 2013-05-28 NOTE — Progress Notes (Signed)
Subjective:    Patient ID: [name], female    DOB: 01-14-1922, 79 y.o.   MRN: [id number]  HPI Here with uri symptoms  Cough started yesterday am (? Or Sunday) - sounds like a dry cough so far  Did have a little production  Sister she lives with was sick with bacterial uri/ bronchitis Temp was 99.4 at home  [facility] here  Pulse ox is good 95%  Is achey in general No st  No ear pain    Today is weak/ not walking/ lethargy  Poor appetite -encourages her to drink   Patient Active Problem List   Diagnosis Date Noted  . Venous insufficiency 08/17/2012  . Abrasion of arm, left 08/17/2012  . UTI (lower urinary tract infection) 08/17/2012  . Syncope 08/08/2012  . Dementia 08/08/2012  . Abnormal EKG 08/08/2012  . Essential hypertension, benign 12/22/2009  . OSTEOPOROSIS NOS 09/24/2007  . GERD 07/16/2007  . DIABETES MELLITUS, TYPE II 07/13/2007  . HYPERLIPIDEMIA 07/13/2007  . OVERACTIVE BLADDER 07/13/2007  . DEGENERATIVE JOINT DISEASE 07/13/2007  . INCONTINENCE, URGE 07/13/2007   Past Medical History  Diagnosis Date  . Depression     type 2  . Hyperlipidemia   . Osteopenia   . Memory loss, short term 3/09    mild cognitive impairment  . Abnormal CT of the chest 10/08    5 mm nodule- needs f/u CT in 1 year  . Squamous cell carcinoma of leg   . DM type 2 (diabetes mellitus, type 2)   . Dysrhythmia     "irregular at times"  . GERD (gastroesophageal reflux disease)   . Alzheimer disease     "severe" (08/08/2012)   Past Surgical History  Procedure Laterality Date  . Total hip arthroplasty  ? first OR; 02/2005    right X 2  . Ischemic colitis  10/98    colonoscopy  . Squamous cell carcinoma excision  10/2001    on leg  . Pelvic fracture surgery  07/2002    after a fall  . Cataract extraction, bilateral  2010  . Dexa  11/01    osteopenia  . Vaginal hysterectomy  1972  . Bladder suspension  02/2001    pelvic vaginal sling for incontinence   History  Substance Use  Topics  . Smoking status: Never Smoker   . Smokeless tobacco: Never Used  . Alcohol Use: No   Family History  Problem Relation Age of Onset  . Stroke Mother   . Dementia Mother   . Stroke Father   . Breast cancer Sister   . Prostate cancer Brother   . Breast cancer Sister   . Memory loss Sister   . Breast cancer Other     neice  . Alzheimer's disease Sister     older sister  . Memory loss Sister     mild   Allergies  Allergen Reactions  . Alendronate Sodium     REACTION: GI; "don't really remember reaction"  . Risedronate Sodium     REACTION: GI; "don't really remember reaction"   Current Outpatient Prescriptions on File Prior to Visit  Medication Sig Dispense Refill  . aspirin 81 MG EC tablet Take 81 mg by mouth daily.        Marland Kitchen atorvastatin (LIPITOR) 10 MG tablet Take 1 tablet (10 mg total) by mouth at bedtime.  30 tablet  11  . calcitonin, salmon, (MIACALCIN/FORTICAL) 200 UNIT/ACT nasal spray Place 1 spray into the nose daily.  3.7 mL  11  . Calcium Carbonate-Vit D-Min (CALTRATE 600+D PLUS) 600-400 MG-UNIT per tablet Take 1 tablet by mouth 2 (two) times daily.        Marland Kitchen donepezil (ARICEPT) 10 MG tablet Take 5 mg by mouth daily.       Marland Kitchen glucose blood test strip Use to check blood sugar daily and as directed. Dx 250.00  100 each  6  . memantine (NAMENDA) 10 MG tablet Take 10 mg by mouth 2 (two) times daily.      . metFORMIN (GLUCOPHAGE) 500 MG tablet TAKE ONE TABLET TWICE A DAY WITH MEALS.  60 tablet  4  . pantoprazole (PROTONIX) 40 MG tablet TAKE ONE TABLET BY MOUTH EACH MORNING  30 tablet  4   No current facility-administered medications on file prior to visit.    Review of Systems Review of Systems  Constitutional: Negative for unexpected weight change. pos for low grade fever/ malaise/ lethargy ENT pos for post nasal drip/ neg for ST or ear pai  Eyes: Negative for pain and visual disturbance.  Respiratory: Negative for shortness of breath.  pos for cough and occ  wheeze  Cardiovascular: Negative for cp or palpitations    Gastrointestinal: Negative for nausea, diarrhea and constipation.  Genitourinary: Negative for urgency and frequency.  Skin: Negative for pallor or rash   Neurological: Negative for weakness, light-headedness, numbness and headaches.  Hematological: Negative for adenopathy. Does not bruise/bleed easily.  Psychiatric/Behavioral: Negative for dysphoric mood. The patient is not nervous/anxious. Pos for MS change- pt is lethargic and sleeping more         Objective:   Physical Exam  Constitutional: She appears well-developed and well-nourished. No distress.  HENT:  Head: Normocephalic and atraumatic.  Right Ear: External ear normal.  Left Ear: External ear normal.  Mouth/Throat: Oropharynx is clear and moist. No oropharyngeal exudate.  Nares are injected and congested  Clear rhinorrhea and post nasal drip   Eyes: Conjunctivae and EOM are normal. Pupils are equal, round, and reactive to light. Right eye exhibits no discharge. Left eye exhibits no discharge. No scleral icterus.  Neck: Normal range of motion. Neck supple.  Cardiovascular: Normal rate and regular rhythm.   Pulmonary/Chest: Effort normal. No respiratory distress. She has wheezes. She has no rales. She exhibits no tenderness.  Generally harsh bs with dry cough  Wheeze on forced exp only  Good air exch No rales  No sob  Abdominal: Soft. Bowel sounds are normal.  Musculoskeletal: She exhibits no edema.  Lymphadenopathy:    She has no cervical adenopathy.  Neurological: She is alert.  Skin: Skin is warm and dry. No rash noted. No erythema. No pallor.  Psychiatric:  Pt is sleepy- in wheelchair- she responds appropriately and cooperates with exam -but not conversative Supportive family present          Assessment & Plan:

## 2013-05-28 NOTE — Assessment & Plan Note (Signed)
With close household exposure  In pt with dementia- lethargy and personality change  Cover with zithromax For cough- robitussin or mucinex prn (will try to avoid narcotics in light of age and MS) Encourage fluids Update if not starting to improve in a week or if worsening

## 2013-05-28 NOTE — Telephone Encounter (Signed)
Will see today as planned. 

## 2013-05-28 NOTE — Telephone Encounter (Signed)
Patient Information:  Caller Name: [name]  Phone: [phone]  Patient: [name], Chai  Gender: Female  DOB: 1922-01-17  Age: 79 Years  PCP: Roxy Manns [facility])  Office Follow Up:  Does the office need to follow up with this patient?: No  Instructions For The Office: N/A   RN Note.  Called office about scheduling pt later in the afternoon d/t pt's sister [name] has an appt @ 1530. Appt scheduled.  Thank you.   Symptoms  Reason For Call & Symptoms: [name] gave the phone to caregiver [place].  Pt presents with cough and fatigue.  Sister [name] noticed the cough last night 6/10.  Sinus drainage present.  Afebrile.  Pt coughed through the night.  Decrease in appetite this am  6/10.  Mercedes reported vital signs BP 126/58 P 92 O2 95%.  No respiratory difficulty.    Reviewed Health History In EMR: Yes  Reviewed Medications In EMR: Yes  Reviewed Allergies In EMR: Yes  Reviewed Surgeries / Procedures: Yes  Date of Onset of Symptoms: 05/27/2013  Guideline(s) Used:  Cough  Disposition Per Guideline:   See Today or Tomorrow in Office  Reason For Disposition Reached:   Patient wants to be seen  Advice Given:  Reassurance  Coughing is the way that our lungs remove irritants and mucus. It helps protect our lungs from getting pneumonia.  Coughing Spasms:  Drink warm fluids. Inhale warm mist (Reason: both relax the airway and loosen up the phlegm).  Prevent Dehydration:  Drink adequate liquids.  This will help soothe an irritated or dry throat and loosen up the phlegm.  Call Back If:  Difficulty breathing occurs  You become worse  Patient Will Follow Care Advice:  YES  Appointment Scheduled:  05/28/2013 15:00:00 Appointment Scheduled Provider:  Roxy Manns [facility])

## 2013-05-28 NOTE — Patient Instructions (Addendum)
Give the zithromax as directed  Encourage fluids and watch blood sugar  mucinex or robitussin are ok for cough If worse or short of breath -alert [place] and take her to the ER

## 2013-06-06 ENCOUNTER — Telehealth: Payer: Self-pay | Admitting: Family Medicine

## 2013-06-06 DIAGNOSIS — I10 Essential (primary) hypertension: Secondary | ICD-10-CM

## 2013-06-06 DIAGNOSIS — E119 Type 2 diabetes mellitus without complications: Secondary | ICD-10-CM

## 2013-06-06 NOTE — Telephone Encounter (Signed)
Message copied by Judy Pimple on Thu Jun 06, 2013  4:02 PM ------      Message from: Alvina Chou      Created: Tue Jun 04, 2013  3:44 PM      Regarding: Lab orders for Wednesday, 7.2.14       Labs for 6 month f/u ------

## 2013-06-14 ENCOUNTER — Other Ambulatory Visit: Payer: Self-pay

## 2013-06-14 ENCOUNTER — Other Ambulatory Visit: Payer: Self-pay | Admitting: Dermatology

## 2013-06-14 MED ORDER — MEMANTINE HCL ER 28 MG PO CP24: 28.0000 mg | ORAL_CAPSULE | Freq: Every day | ORAL | Status: DC

## 2013-06-14 NOTE — Telephone Encounter (Signed)
Namenda 10mg  is no longer available.  Changed to new formulation of 28mg  XR once daily, okay per Dr Frances Furbish.

## 2013-06-19 ENCOUNTER — Other Ambulatory Visit (INDEPENDENT_AMBULATORY_CARE_PROVIDER_SITE_OTHER): Payer: Medicare Other

## 2013-06-19 DIAGNOSIS — E119 Type 2 diabetes mellitus without complications: Secondary | ICD-10-CM

## 2013-06-19 DIAGNOSIS — I10 Essential (primary) hypertension: Secondary | ICD-10-CM

## 2013-06-19 LAB — COMPREHENSIVE METABOLIC PANEL
CO2: 29 mEq/L (ref 19–32)
Calcium: 9.6 mg/dL (ref 8.4–10.5)
Chloride: 100 mEq/L (ref 96–112)
Creatinine, Ser: 0.8 mg/dL (ref 0.4–1.2)
GFR: 76.19 mL/min (ref >60.00)
Glucose, Bld: 97 mg/dL (ref 70–99)
Total Bilirubin: 0.9 mg/dL (ref 0.3–1.2)
Total Protein: 6.9 g/dL (ref 6.0–8.3)

## 2013-06-19 LAB — HEMOGLOBIN A1C: Hgb A1c MFr Bld: 6.8 % — ABNORMAL HIGH (ref 4.6–6.5)

## 2013-06-24 ENCOUNTER — Ambulatory Visit: Payer: Medicare Other | Admitting: Family Medicine

## 2013-06-26 ENCOUNTER — Ambulatory Visit (INDEPENDENT_AMBULATORY_CARE_PROVIDER_SITE_OTHER): Payer: Medicare Other | Admitting: Family Medicine

## 2013-06-26 ENCOUNTER — Encounter: Payer: Self-pay | Admitting: Family Medicine

## 2013-06-26 VITALS — BP 122/70 | HR 82 | Temp 96.2°F | Ht 61.0 in | Wt 119.0 lb

## 2013-06-26 DIAGNOSIS — I10 Essential (primary) hypertension: Secondary | ICD-10-CM

## 2013-06-26 DIAGNOSIS — E785 Hyperlipidemia, unspecified: Secondary | ICD-10-CM

## 2013-06-26 DIAGNOSIS — E119 Type 2 diabetes mellitus without complications: Secondary | ICD-10-CM

## 2013-06-26 NOTE — Assessment & Plan Note (Signed)
bp in fair control at this time  No changes needed  Disc lifstyle change with low sodium diet and exercise  Lab rev Handout on dash diet given

## 2013-06-26 NOTE — Assessment & Plan Note (Signed)
Lab Results  Component Value Date   HGBA1C 6.8* 06/19/2013   Stable Good diet  No change in med Disc watching for low sugar bp too labile for ace

## 2013-06-26 NOTE — Assessment & Plan Note (Signed)
Lipids were in jan Disc goals for lipids and reasons to control them Rev labs with pt Rev low sat fat diet in detail Good control with statin and diet

## 2013-06-26 NOTE — Progress Notes (Signed)
Subjective:    Patient ID: [name], female    DOB: 04-Aug-1922, 79 y.o.   MRN: [id number]  HPI Here for f/u of chronic health problems  Wt is stable with bmi of 22  bp tends to be on the low side - watches - is labile at time    bp is stable today  No cp or palpitations or headaches or edema  No side effects to medicines  BP Readings from Last 3 Encounters:  06/26/13 122/70  05/28/13 108/64  03/15/13 122/60      Dementia - appetite is good and that is reassuring  Memory is about the same- no better or worse  Does not get agitated   Diabetes Home sugar results are generally in the 90s to low 100s - doing great  DM diet - remains good/ the same  Exercise -not much- is fairly immobile  Symptoms- none  A1C last  Lab Results  Component Value Date   HGBA1C 6.8* 06/19/2013  this is the same as last time   No problems with medications  Renal protection-no ace due to hypotension  Last eye exam was 1/14 - has appt for her next exam   Hyperlipidemia On lipitor Lab Results  Component Value Date   CHOL 149 12/21/2012   HDL 51.70 12/21/2012   LDLCALC 75 12/21/2012   TRIG 113.0 12/21/2012   CHOLHDL 3 12/21/2012    Has not had shingles vaccine - does not think ins will pay but will double check     Patient Active Problem List   Diagnosis Date Noted  . Venous insufficiency 08/17/2012  . Syncope 08/08/2012  . Dementia 08/08/2012  . Abnormal EKG 08/08/2012  . Essential hypertension, benign 12/22/2009  . OSTEOPOROSIS NOS 09/24/2007  . GERD 07/16/2007  . DIABETES MELLITUS, TYPE II 07/13/2007  . HYPERLIPIDEMIA 07/13/2007  . OVERACTIVE BLADDER 07/13/2007  . DEGENERATIVE JOINT DISEASE 07/13/2007  . INCONTINENCE, URGE 07/13/2007   Past Medical History  Diagnosis Date  . Depression     type 2  . Hyperlipidemia   . Osteopenia   . Memory loss, short term 3/09    mild cognitive impairment  . Abnormal CT of the chest 10/08    5 mm nodule- needs f/u CT in 1 year  . Squamous  cell carcinoma of leg   . DM type 2 (diabetes mellitus, type 2)   . Dysrhythmia     "irregular at times"  . GERD (gastroesophageal reflux disease)   . Alzheimer disease     "severe" (08/08/2012)   Past Surgical History  Procedure Laterality Date  . Total hip arthroplasty  ? first OR; 02/2005    right X 2  . Ischemic colitis  10/98    colonoscopy  . Squamous cell carcinoma excision  10/2001    on leg  . Pelvic fracture surgery  07/2002    after a fall  . Cataract extraction, bilateral  2010  . Dexa  11/01    osteopenia  . Vaginal hysterectomy  1972  . Bladder suspension  02/2001    pelvic vaginal sling for incontinence   History  Substance Use Topics  . Smoking status: Never Smoker   . Smokeless tobacco: Never Used  . Alcohol Use: No   Family History  Problem Relation Age of Onset  . Stroke Mother   . Dementia Mother   . Stroke Father   . Breast cancer Sister   . Prostate cancer Brother   . Breast cancer  Sister   . Memory loss Sister   . Breast cancer Other     neice  . Alzheimer's disease Sister     older sister  . Memory loss Sister     mild   Allergies  Allergen Reactions  . Alendronate Sodium     REACTION: GI; "don't really remember reaction"  . Risedronate Sodium     REACTION: GI; "don't really remember reaction"   Current Outpatient Prescriptions on File Prior to Visit  Medication Sig Dispense Refill  . aspirin 81 MG EC tablet Take 81 mg by mouth daily.        Marland Kitchen atorvastatin (LIPITOR) 10 MG tablet Take 1 tablet (10 mg total) by mouth at bedtime.  30 tablet  11  . calcitonin, salmon, (MIACALCIN/FORTICAL) 200 UNIT/ACT nasal spray Place 1 spray into the nose daily.  3.7 mL  11  . Calcium Carbonate-Vit D-Min (CALTRATE 600+D PLUS) 600-400 MG-UNIT per tablet Take 1 tablet by mouth 2 (two) times daily.        Marland Kitchen donepezil (ARICEPT) 10 MG tablet Take 5 mg by mouth daily.       Marland Kitchen glucose blood test strip Use to check blood sugar daily and as directed. Dx 250.00   100 each  6  . Memantine HCl ER (NAMENDA XR) 28 MG CP24 Take 28 mg by mouth daily.  30 capsule  5  . metFORMIN (GLUCOPHAGE) 500 MG tablet TAKE ONE TABLET TWICE A DAY WITH MEALS.  60 tablet  4  . pantoprazole (PROTONIX) 40 MG tablet TAKE ONE TABLET BY MOUTH EACH MORNING  30 tablet  4   No current facility-administered medications on file prior to visit.    Review of Systems Review of Systems  Constitutional: Negative for fever, appetite change,  and unexpected weight change. pos for fatigue with a lot of sleeping  Eyes: Negative for pain and visual disturbance.  Respiratory: Negative for cough and shortness of breath.   Cardiovascular: Negative for cp or palpitations    Gastrointestinal: Negative for nausea, diarrhea and constipation.  Genitourinary: Negative for urgency and frequency.  Skin: Negative for pallor or rash   Neurological: Negative for weakness, light-headedness, numbness and headaches.  Hematological: Negative for adenopathy. Does not bruise/bleed easily.  Psychiatric/Behavioral: Negative for dysphoric mood. The patient is not nervous/anxious. Pos for memory loss        Objective:   Physical Exam  Constitutional: She appears well-developed and well-nourished. No distress.  Elderly frail app female in wheelchair- who sleeps intermittently   HENT:  Head: Normocephalic and atraumatic.  Eyes: Conjunctivae and EOM are normal. Pupils are equal, round, and reactive to light. Right eye exhibits no discharge. Left eye exhibits no discharge. No scleral icterus.  Neck: Normal range of motion. Neck supple. No JVD present. Carotid bruit is not present. No thyromegaly present.  Cardiovascular: Normal rate, regular rhythm and intact distal pulses.  Exam reveals no gallop.   Pulmonary/Chest: Effort normal and breath sounds normal. No respiratory distress. She has no wheezes. She has no rales.  Abdominal: Soft. Bowel sounds are normal. She exhibits no distension, no abdominal bruit and no  mass. There is no tenderness.  Musculoskeletal: She exhibits no edema and no tenderness.  Lymphadenopathy:    She has no cervical adenopathy.  Neurological: She is alert. She has normal reflexes. No cranial nerve deficit. She exhibits normal muscle tone. Coordination normal.  Skin: Skin is warm and dry. No rash noted. No erythema. No pallor.  Psychiatric: Her  mood appears not anxious. She is not agitated. Cognition and memory are impaired. She does not exhibit a depressed mood. She exhibits abnormal recent memory.          Assessment & Plan:

## 2013-06-26 NOTE — Patient Instructions (Addendum)
If you are interested in a shingles/zoster vaccine - call your insurance to check on coverage,( you should not get it within 1 month of other vaccines) , then call us for a prescription  for it to take to a pharmacy that gives the shot , or make a nurse visit to get it here depending on your coverage Follow up in 6 months for follow up with labs prior

## 2013-07-09 ENCOUNTER — Other Ambulatory Visit: Payer: Self-pay | Admitting: Family Medicine

## 2013-08-05 ENCOUNTER — Telehealth: Payer: Self-pay

## 2013-08-05 MED ORDER — ZOSTER VACCINE LIVE 19400 UNT/0.65ML ~~LOC~~ SOLR: 0.6500 mL | Freq: Once | SUBCUTANEOUS | Status: DC

## 2013-08-05 NOTE — Telephone Encounter (Signed)
Px printed for pick up in IN box  

## 2013-08-05 NOTE — Telephone Encounter (Signed)
[name] left v/m requesting shingles prescription for pt. Call [facility] when ready for pick up. [name] not sure what pharmacy she will go to). Pt needs handicap placard signed.Please advise.

## 2013-08-06 NOTE — Telephone Encounter (Signed)
[name] notified Rx ready for pick up, I also put handicap placard (signed) in with Rx

## 2013-08-16 ENCOUNTER — Other Ambulatory Visit: Payer: Self-pay

## 2013-08-16 MED ORDER — DONEPEZIL HCL 5 MG PO TABS: 5.0000 mg | ORAL_TABLET | Freq: Every day | ORAL | Status: DC

## 2013-08-16 NOTE — Telephone Encounter (Signed)
[facility] patient assigned to Dr Frances Furbish.  Has an appt scheduled in Dec

## 2013-09-11 ENCOUNTER — Ambulatory Visit: Payer: Medicare Other

## 2013-09-12 ENCOUNTER — Ambulatory Visit (INDEPENDENT_AMBULATORY_CARE_PROVIDER_SITE_OTHER): Payer: Medicare Other

## 2013-09-12 DIAGNOSIS — Z23 Encounter for immunization: Secondary | ICD-10-CM

## 2013-09-16 ENCOUNTER — Encounter: Payer: Self-pay | Admitting: Neurology

## 2013-09-16 ENCOUNTER — Ambulatory Visit (INDEPENDENT_AMBULATORY_CARE_PROVIDER_SITE_OTHER): Payer: Medicare Other | Admitting: Neurology

## 2013-09-16 VITALS — BP 112/53 | HR 88 | Temp 97.7°F | Ht 61.0 in | Wt 119.0 lb

## 2013-09-16 DIAGNOSIS — F0390 Unspecified dementia without behavioral disturbance: Secondary | ICD-10-CM

## 2013-09-16 NOTE — Patient Instructions (Addendum)
I think overall you are doing fairly well and are stable at this point.   I do have some generic suggestions for you today:  Please make sure that you drink plenty of fluids. I would like for you to exercise daily for example in the form of walking 20-30 minutes every day, if you can. Please keep a regular sleep-wake schedule, keep regular meal times, do not skip any meals, eat  healthy snacks in between meals, such as fruit or nuts. Try to eat protein with every meal.   As far as your medications are concerned, I would like to suggest: no changes.    As far as diagnostic testing, I recommend: no new test.  Engage in social activities in your community and with your family and try to keep up with current events by reading the newspaper or watching the news.  I do not think we need to make any changes in your medications at this point. I think you're stable enough that I can see you back in 6 months, sooner if we need to. Please call us if you have any interim questions, concerns, or problems or updates to need to discuss.  Steve is my clinical assistant and will answer any of your questions and relay your messages to me and will give you my messages.   Our phone number is [phone]. We also have an after hours call service for urgent matters and there is a physician on-call for urgent questions. For any emergencies you know to call 911 or go to the nearest emergency room.      

## 2013-09-16 NOTE — Progress Notes (Signed)
Subjective:    Patient ID: [name] is a 79 y.o. female.  HPI  Interim history:   [name] is a very pleasant 79 year old right-handed woman with an underlying medical history of type 2 diabetes, melanoma, history of breast cancer, pelvic fracture, osteopenia, TIA, venous insufficiency, diverticulosis, hyperlipidemia who presents for followup consultation for her headaches and her history of TIA. She is accompanied by her niece is again today. I first met her on 06/13/2013 and she reported a TIA-like spell earlier in June and was seen in the hospital by my colleague Dr. Pearlean Brownie. He changed her aspirin to Plavix and held her metformin due to low blood sugar values. She reported being in her normal state of health during the late moring on 05/23/13 when she had sudden onset of R hand and arm shaking, which she not control, and she could not get her words out. Her sister's caregiver called 911. She had a full stroke w/u in the hospital which I reviewed: Her CT showed a small plaque in the MCA M2 branch and white matter changes, she had negative carotid Doppler studies, there was no acute stroke on MRI brain and she had intracranial atherosclerosis on MRA head. Her blood work was unremarkable, with normal lipids, A1c of 6.3. Carotid doppler study in June 2014 showed 0-49% ICA stenosis. She had transient neurological symptoms including difficulty forming her words and right arm shakiness, no facial droop. She has a long-standing history of migraine headaches, but also c/o a bandlike sensation in the front. For her headaches she takes tylenol. She was given neurontin by a HA specialist in the past, but it was not helpful.  Since she was discharged she has had more hand tremor and intermittent zoning out at time, no frank convulsions. She has had a hand tremor for years. Her 2 nephews have tremors.  Since her last visit she presented to the emergency room on 08/07/2013 with chest pain, relieved by  nitroglycerin sublingual. An acute coronary syndrome was excluded. She had a stress test: On 08/08/2013 she had a normal Lexi scan Myoview. She had a chest x-ray on 08/07/2013 which was negative for any acute or active cardiopulmonary disease. She had a head CT without contrast on 07/24/2013 which showed no acute intracranial abnormalities, near-complete opacification of the right sphenoid sinus, mild cerebral atrophy with extensive chronic microvascular ischemic changes. On 07/18/2013 she had a bilateral mammogram showing stable post lumpectomy site in the left breast and probably benign calcifications in the upper outer right breast which appear punctate and loosely grouped.  She saw her primary care physician on 08/21/2013 and endorsed anxiety and stressors. At the time of her first visit with me I ordered an EEG. This was normal in the awake state. I felt that her headaches were primarily tension-type headaches. She also had evidence of essential tremor, very mild. She is doing better in all aspect, she is starting to gain some weight back.   Her Past Medical History Is Significant For: Past Medical History  Diagnosis Date  . Depression     type 2  . Hyperlipidemia   . Osteopenia   . Memory loss, short term 3/09    mild cognitive impairment  . Abnormal CT of the chest 10/08    5 mm nodule- needs f/u CT in 1 year  . Squamous cell carcinoma of leg   . DM type 2 (diabetes mellitus, type 2)   . Dysrhythmia     "irregular at times"  .  GERD (gastroesophageal reflux disease)   . Alzheimer disease     "severe" (08/08/2012)    Her Past Surgical History Is Significant For: Past Surgical History  Procedure Laterality Date  . Total hip arthroplasty  ? first OR; 02/2005    right X 2  . Ischemic colitis  10/98    colonoscopy  . Squamous cell carcinoma excision  10/2001    on leg  . Pelvic fracture surgery  07/2002    after a fall  . Cataract extraction, bilateral  2010  . Dexa  11/01     osteopenia  . Vaginal hysterectomy  1972  . Bladder suspension  02/2001    pelvic vaginal sling for incontinence    Her Family History Is Significant For: Family History  Problem Relation Age of Onset  . Stroke Mother   . Dementia Mother   . Stroke Father   . Breast cancer Sister   . Prostate cancer Brother   . Breast cancer Sister   . Memory loss Sister   . Breast cancer Other     neice  . Alzheimer's disease Sister     older sister  . Memory loss Sister     mild    Her Social History Is Significant For: History   Social History  . Marital Status: Single    Spouse Name: N/A    Number of Children: 0  . Years of Education: N/A   Occupational History  . Retired Engineer, site    Social History Main Topics  . Smoking status: Never Smoker   . Smokeless tobacco: Never Used  . Alcohol Use: No  . Drug Use: No  . Sexual Activity: None   Other Topics Concern  . None   Social History Narrative   Lives with her sister who helps care for her (and another sister).   Involved in church activities    Her Allergies Are:  Allergies  Allergen Reactions  . Alendronate Sodium     REACTION: GI; "don't really remember reaction"  . Risedronate Sodium     REACTION: GI; "don't really remember reaction"  :   Her Current Medications Are:  Outpatient Encounter Prescriptions as of 09/16/2013  Medication Sig Dispense Refill  . aspirin 81 MG EC tablet Take 81 mg by mouth daily.        Marland Kitchen atorvastatin (LIPITOR) 10 MG tablet Take 1 tablet (10 mg total) by mouth at bedtime.  30 tablet  11  . calcitonin, salmon, (MIACALCIN/FORTICAL) 200 UNIT/ACT nasal spray Place 1 spray into the nose daily.  3.7 mL  11  . Calcium Carbonate-Vit D-Min (CALTRATE 600+D PLUS) 600-400 MG-UNIT per tablet Take 1 tablet by mouth 2 (two) times daily.        Marland Kitchen donepezil (ARICEPT) 5 MG tablet Take 1 tablet (5 mg total) by mouth daily.  30 tablet  3  . glucose blood test strip Use to check blood sugar daily and as  directed. Dx 250.00  100 each  6  . Memantine HCl ER (NAMENDA XR) 28 MG CP24 Take 28 mg by mouth daily.  30 capsule  5  . metFORMIN (GLUCOPHAGE) 500 MG tablet TAKE ONE (1) TABLET BY MOUTH TWO (2) TIMES DAILY WITH MEALS  60 tablet  5  . pantoprazole (PROTONIX) 40 MG tablet TAKE ONE TABLET BY MOUTH EVERY MORNING  30 tablet  5  . zoster vaccine live, PF, (ZOSTAVAX) 45409 UNT/0.65ML injection Inject 19,400 Units into the skin once.  1 vial  0   No facility-administered encounter medications on file as of 09/16/2013.  :  Review of Systems  Constitutional: Positive for activity change, appetite change and fatigue.  Respiratory:       Snoring  Gastrointestinal:       Incontinence   Endocrine: Positive for cold intolerance.  Genitourinary:       Incontinence  Neurological: Positive for syncope and weakness.       Memory loss  Psychiatric/Behavioral: Positive for confusion and decreased concentration.       Too much sleep    Objective:  Neurologic Exam  Physical Exam Physical Examination:   Filed Vitals:   09/16/13 1102  BP: 112/53  Pulse: 88  Temp: 97.7 F (36.5 C)    General Examination: The patient is a very pleasant 79 y.o. female in no acute distress.  HEENT: Normocephalic, atraumatic, pupils are equal, round and reactive to light and accommodation. Extraocular tracking is fair without nystagmus noted. Hearing is grossly intact. Face is symmetric with normal facial animation and normal facial sensation. Speech is clear with no dysarthria noted. There is mild hypophonia. There is no lip, neck or jaw tremor. Neck is supple with full range of motion. Oropharynx exam reveals normal findings. No significant airway crowding is noted. Mallampati is class II. Tongue protrudes centrally and palate elevates symmetrically.    Chest: is clear to auscultation without wheezing, rhonchi or crackles noted.  Heart: sounds are regular and normal without murmurs, rubs or gallops noted.    Abdomen: is soft, non-tender and non-distended with normal bowel sounds appreciated on auscultation.  Extremities: There is no pitting edema in the distal lower extremities bilaterally. Pedal pulses are intact.  Skin: is warm and dry with no trophic changes noted.  Musculoskeletal: exam reveals no obvious joint deformities, tenderness or joint swelling or erythema.  Neurologically:  Mental status: The patient is awake, but does not always pay full attention. She is calm and cooperative. She is oriented to self only. Her memory, attention, language and knowledge are significantly impaired. There is slowness in thinking. Speech is scarce and low volume, but clear. Affect is normal. Cranial nerves are as described above under HEENT exam. In addition, shoulder shrug is normal with equal shoulder height noted. Motor exam: Normal bulk, strength and tone is noted for age. There is no drift, tremor or rebound. Romberg is negative. Reflexes are 2+ throughout. Fine motor skills are mildly impaired.   Cerebellar testing shows no dysmetria or intention tremor. There is no truncal or gait ataxia.  Sensory exam is intact to light touch throughout.   Gait, station and balance: she needs help standing. She walks with a 2 wheeled walker. Posture is moderately stooped with increase in kyphoscoliosis in the upper back.    Assessment and Plan:   In summary, [name] is a very pleasant 34 -year old female with a history of advanced Alzheimers disease. She has remained fairly stable. She has a family Hx of memory loss and has had progressive memory loss without behavioral disturbance. I had a long chat with the patient and particularly her sister and niece today about my findings and the diagnosis, its prognosis and treatment options. We talked about medical treatments and non-pharmacological approaches. We talked about trying to maintaining a healthy lifestyle in general. I encouraged them to continue with  healthy and balanced meals, keeping a regular routine for meals, naps, outings and to keep a scheduled bedtime and wake time routine, to not skip any  meals and eat healthy snacks in between meals and to have protein with every meal. They can try to have her drink Ensure in between meals, perhaps one bottle throughout the day. Her HbA1c has been in the 6.8 range.  I recommended no changes in her medications and they did not need refills today. I answered all their questions today and the patient and her family were in agreement with the plan. I would like to see her back in 6 months, sooner if the need arises and encouraged them to call with any interim questions, concerns, problems or updates and refill requests.

## 2013-09-18 ENCOUNTER — Ambulatory Visit: Payer: Medicare Other | Admitting: Neurology

## 2013-11-28 ENCOUNTER — Ambulatory Visit (INDEPENDENT_AMBULATORY_CARE_PROVIDER_SITE_OTHER): Payer: Medicare Other | Admitting: Neurology

## 2013-11-28 ENCOUNTER — Encounter (INDEPENDENT_AMBULATORY_CARE_PROVIDER_SITE_OTHER): Payer: Self-pay

## 2013-11-28 ENCOUNTER — Encounter: Payer: Self-pay | Admitting: Neurology

## 2013-11-28 VITALS — BP 128/64 | HR 88 | Temp 97.6°F | Ht 61.0 in

## 2013-11-28 DIAGNOSIS — F0390 Unspecified dementia without behavioral disturbance: Secondary | ICD-10-CM

## 2013-11-28 DIAGNOSIS — F0280 Dementia in other diseases classified elsewhere without behavioral disturbance: Secondary | ICD-10-CM

## 2013-11-28 MED ORDER — DONEPEZIL HCL 5 MG PO TABS: 5.0000 mg | ORAL_TABLET | Freq: Every day | ORAL | Status: DC

## 2013-11-28 MED ORDER — MEMANTINE HCL ER 28 MG PO CP24: 28.0000 mg | ORAL_CAPSULE | Freq: Every day | ORAL | Status: DC

## 2013-11-28 NOTE — Progress Notes (Signed)
Subjective:    Patient ID: [name] is a 79 y.o. female.  HPI    Interim history:   [name] is a very pleasant 79 year old right-handed woman who presents for followup consultation of her advanced memory loss without behavioral changes. She is accompanied by her sister and niece again today. She is on Namenda twice daily. I recommended last time that they improve her nutrition and oral intake with Ensure supplements. She was not able to tolerate Aricept because of syncope and history of bradycardia.  Today, they report no changes in her Hx and her medications. 5 out of a total 8 total siblings have/had AD, some with behavioral issues. Her youngest sister is here today. She has had no recent episodes of bradycardia or syncope. They have no new concerns; she is not able to provide any history.   She previously saw Dr. Sandria Manly and was last seen by him on 09/21/2012 at which time he felt that she had improved on Namenda. He decreased her Aricept because of a history of syncope.   She lives with her two sisters and was first seen by Dr. Sandria Manly in 2009 with memory loss beginning in 2008 or before. She has short-term episodic memory loss beginning with conversations and in taking her medications She also has procedural memory loss, not knowing how to use the TV remote. 2 other sisters and her brother have dementia. Initial MMSE was 27/30, CDT 4/4, then on 05/06/2009 MMSE 28/30, CDT 4/4, AFT 8. On 09/23/2009 = MMSE 28/30, CDT 4/4. On 10/14/10 = MMSE 26/30, CDT 4/4, AFT 12. Examination was remarkable for severe kyphosis and peripheral neuropathy. Workup for treatable causes of memory loss was negative including MRI study of the brain, TSH, B12, sedimentation rate, folate, and CBC. She is sleeping well. She no longer dresses herself, or takes care of her toilet needs. She feeds herself. They have a CNA 24/7. She has been accepted for long-term care insurance benefits. She denies depression, and there is no  report of halluciation, delusions, outbursts or agitation. She does not exercise. She reads and enjoys television but is reading less than she used to. She does not cook or clean. She makes her bed. She attends church women activities and senior citizens group activities and a "friend's and [employer] officer. She has bladder incontinence but no bowel incontinence. She uses depends. She does not fall, but her gait is very unsteady. Falls assessment tool score is 17. She does not have bizarre dreams. On 10/19/11 = MMSE 26/30, CDT 4/4, AFT 8, Geriatric depression scale was 2/15.  04/12/2012 = MMSE 15/30, CDT 3/4, AFT 6. The patient passed out on 8/21/213 at the breakfast table without tonic-clonic activity. She was seen at [facility] and thought to be dehydrated with elevated WBC and urinary tract infection. She passed out again on 09/16/2012 at church in the sitting position without tonic-clonic activity or urinary incontinence.She had no warning that she was going to pass out with either incident. There have been no other new changes. On 09/21/2012 = MMSE 22/30, CDT 4/4, AFT 7.   Her Past Medical History Is Significant For: Past Medical History  Diagnosis Date  . Depression     type 2  . Hyperlipidemia   . Osteopenia   . Memory loss, short term 3/09    mild cognitive impairment  . Abnormal CT of the chest 10/08    5 mm nodule- needs f/u CT in 1 year  . Squamous cell carcinoma  of leg   . DM type 2 (diabetes mellitus, type 2)   . Dysrhythmia     "irregular at times"  . GERD (gastroesophageal reflux disease)   . Alzheimer disease     "severe" (08/08/2012)    Her Past Surgical History Is Significant For: Past Surgical History  Procedure Laterality Date  . Total hip arthroplasty  ? first OR; 02/2005    right X 2  . Ischemic colitis  10/98    colonoscopy  . Squamous cell carcinoma excision  10/2001    on leg  . Pelvic fracture surgery  07/2002    after a fall  . Cataract extraction,  bilateral  2010  . Dexa  11/01    osteopenia  . Vaginal hysterectomy  1972  . Bladder suspension  02/2001    pelvic vaginal sling for incontinence    Her Family History Is Significant For: Family History  Problem Relation Age of Onset  . Stroke Mother   . Dementia Mother   . Stroke Father   . Breast cancer Sister   . Prostate cancer Brother   . Breast cancer Sister   . Memory loss Sister   . Breast cancer Other     neice  . Alzheimer's disease Sister     older sister  . Memory loss Sister     mild    Her Social History Is Significant For: History   Social History  . Marital Status: Single    Spouse Name: N/A    Number of Children: 0  . Years of Education: N/A   Occupational History  . Retired Engineer, site    Social History Main Topics  . Smoking status: Never Smoker   . Smokeless tobacco: Never Used  . Alcohol Use: No  . Drug Use: No  . Sexual Activity: None   Other Topics Concern  . None   Social History Narrative   Lives with her sister who helps care for her (and another sister).   Involved in church activities    Her Allergies Are:  Allergies  Allergen Reactions  . Alendronate Sodium     REACTION: GI; "don't really remember reaction"  . Risedronate Sodium     REACTION: GI; "don't really remember reaction"  :   Her Current Medications Are:  Outpatient Encounter Prescriptions as of 11/28/2013  Medication Sig  . aspirin 81 MG EC tablet Take 81 mg by mouth daily.    Marland Kitchen atorvastatin (LIPITOR) 10 MG tablet Take 1 tablet (10 mg total) by mouth at bedtime.  . calcitonin, salmon, (MIACALCIN/FORTICAL) 200 UNIT/ACT nasal spray Place 1 spray into the nose daily.  . Calcium Carbonate-Vit D-Min (CALTRATE 600+D PLUS) 600-400 MG-UNIT per tablet Take 1 tablet by mouth 2 (two) times daily.    Marland Kitchen donepezil (ARICEPT) 5 MG tablet Take 1 tablet (5 mg total) by mouth daily.  Marland Kitchen glucose blood test strip Use to check blood sugar daily and as directed. Dx 250.00  .  metFORMIN (GLUCOPHAGE) 500 MG tablet TAKE ONE (1) TABLET BY MOUTH TWO (2) TIMES DAILY WITH MEALS  . pantoprazole (PROTONIX) 40 MG tablet TAKE ONE TABLET BY MOUTH EVERY MORNING  . zoster vaccine live, PF, (ZOSTAVAX) 09811 UNT/0.65ML injection Inject 19,400 Units into the skin once.  . Memantine HCl ER (NAMENDA XR) 28 MG CP24 Take 28 mg by mouth daily.   Review of Systems:  Out of a complete 14 point review of systems, all are reviewed and negative with the exception of  these symptoms as listed below:  Review of Systems  Constitutional: Positive for fatigue.  HENT: Positive for rhinorrhea.   Eyes: Negative.   Respiratory: Negative.   Cardiovascular: Negative.   Gastrointestinal: Negative.   Endocrine: Positive for cold intolerance.  Genitourinary: Negative.   Musculoskeletal: Positive for gait problem.  Skin: Negative.   Allergic/Immunologic: Negative.   Neurological:       Memory loss  Hematological: Negative.   Psychiatric/Behavioral: Positive for sleep disturbance (daytime sleepiness) and decreased concentration.    Objective:  Neurologic Exam  Physical Exam Physical Examination:   Filed Vitals:   11/28/13 1445  BP: 128/64  Pulse: 88  Temp: 97.6 F (36.4 C)    General Examination: The patient is a very pleasant 79 y.o. female in no acute distress.  HEENT: Normocephalic, atraumatic, pupils are equal, round and reactive to light and accommodation. Extraocular tracking is fair without nystagmus noted. Hearing is grossly intact. Face is symmetric with normal facial animation and normal facial sensation. Speech is clear with no dysarthria noted. There is mild hypophonia. There is no lip, neck or jaw tremor. Neck is supple with full range of motion. Oropharynx exam reveals normal findings. No significant airway crowding is noted. Mallampati is class II. Tongue protrudes centrally and palate elevates symmetrically.    Chest: is clear to auscultation without wheezing, rhonchi or  crackles noted.  Heart: sounds are regular and normal without murmurs, rubs or gallops noted.   Abdomen: is soft, non-tender and non-distended with normal bowel sounds appreciated on auscultation.  Extremities: There is no pitting edema in the distal lower extremities bilaterally. Pedal pulses are intact.  Skin: is warm and dry with no trophic changes noted.  Musculoskeletal: exam reveals no obvious joint deformities, tenderness or joint swelling or erythema.  Neurologically:  Mental status: The patient is awake, but does not always pay full attention. She is calm and cooperative. She is oriented to self only. Her memory, attention, language and knowledge are significantly impaired. There is slowness in thinking. Speech is scarce and low volume, but clear. She does not answer any questions. Affect is normal. Cranial nerves are as described above under HEENT exam. In addition, shoulder shrug is normal with equal shoulder height noted. Motor exam: Normal bulk, strength and tone is noted for age. There is no drift, tremor or rebound. Romberg is negative. Reflexes are 2+ throughout. Fine motor skills are mildly impaired.   Cerebellar testing shows no dysmetria or intention tremor. There is no truncal or gait ataxia.  Sensory exam is intact to light touch throughout.   Gait, station and balance: she needs help standing. She walks with a 2 wheeled walker. Posture is moderately stooped with increase in kyphoscoliosis in the upper back.    Assessment and Plan:   In summary, SHIRLETTE SCARBER is a very pleasant 79 year old female with a history of advanced Alzheimers disease without behavioral disturbance. She has remained stable. She has a family Hx of AD in 4 other of the 7 siblings and has had progressive memory loss without behavioral disturbance, but stable in the past months. I encouraged them to continue with healthy and balanced meals, keeping a regular routine for meals, naps, outings and to keep a  scheduled bedtime and wake time routine, to not skip any meals and eat healthy snacks in between meals and to have protein with every meal. They can try to have her drink Ensure in between meals, perhaps one bottle throughout the day. Her medications will  remain the same, Aricept 5 mg daily and Namenda XR 28 mg daily. I answered all their questions today and also filled out a form for prior authorization of Namenda XR 28 mg strength. I would like to see her back in 6 months, sooner if the need arises and encouraged them to call with any interim questions, concerns, problems or updates and refill requests.  Most of my 25 minute visit today was spent in counseling and coordination of care, reviewing test results and reviewing medication.

## 2013-11-28 NOTE — Patient Instructions (Signed)
Please continue with Namenda XR 28 mg once daily and Aricept 5 mg once in the evening.

## 2013-12-17 ENCOUNTER — Ambulatory Visit (INDEPENDENT_AMBULATORY_CARE_PROVIDER_SITE_OTHER): Payer: Medicare Other

## 2013-12-17 VITALS — BP 116/72 | HR 86 | Resp 12

## 2013-12-17 DIAGNOSIS — M79609 Pain in unspecified limb: Secondary | ICD-10-CM

## 2013-12-17 DIAGNOSIS — E1140 Type 2 diabetes mellitus with diabetic neuropathy, unspecified: Secondary | ICD-10-CM

## 2013-12-17 DIAGNOSIS — E1142 Type 2 diabetes mellitus with diabetic polyneuropathy: Secondary | ICD-10-CM

## 2013-12-17 DIAGNOSIS — E1149 Type 2 diabetes mellitus with other diabetic neurological complication: Secondary | ICD-10-CM

## 2013-12-17 DIAGNOSIS — B351 Tinea unguium: Secondary | ICD-10-CM

## 2013-12-17 DIAGNOSIS — Q828 Other specified congenital malformations of skin: Secondary | ICD-10-CM

## 2013-12-17 NOTE — Progress Notes (Signed)
   Subjective:    Patient ID: [name], female    DOB: Jan 15, 1922, 79 y.o.   MRN: [id number]  HPI Comments: ''TOENAILS TRIM.''     Review of Systems deferred at this visit.     Objective:   Physical Exam Masker status is intact although diminished bilateral thready pulses dorsalis pedis pulse one over 4 PT plus one over 4 temperature warm to cool turgor diminished there is no edema rubor pallor mild varicosities noted. Neurologically epicritic and proprioceptive sensations diminished on Semmes Weinstein testing to forefoot digits although they're hypersensitive of the second digit right foot where she has a hemorrhage a keratoses and distal clavus. Hammertoe deformities and arthrosis noted both feet patient does have nonspine diabetes with neuropathy noted. Digital contractures identified no purulent discharge or drainage there is some bloody keratoses distal clavus second right no secondary infection is noted no ascending psoas lymphangitis.       Assessment & Plan:  Assessment this time diabetes with peripheral neuropathy. Patient does have thick brittle crumbly friable mycotic nails 1 through 4 bilateral nails are tender painful on palpation and attempted debridement and with enclosed shoe wear. Patient walks with the assistance of a walker no secondary infections at this time nails debrided x10 also debridement of distal clavus second right and this is treated with some Neosporin and Band-Aid dressing tube foam padding dispensed reappointed 3 months for continued palliative care maintain Neosporin and Band-Aid to the second toe for couple days as instructed.  Alvan Dame DPM

## 2013-12-17 NOTE — Patient Instructions (Addendum)
Diabetes and Foot Care Diabetes may cause you to have problems because of poor blood supply (circulation) to your feet and legs. This may cause the skin on your feet to become thinner, break easier, and heal more slowly. Your skin may become dry, and the skin may peel and crack. You may also have nerve damage in your legs and feet causing decreased feeling in them. You may not notice minor injuries to your feet that could lead to infections or more serious problems. Taking care of your feet is one of the most important things you can do for yourself.  HOME CARE INSTRUCTIONS  Wear shoes at all times, even in the house. Do not go barefoot. Bare feet are easily injured.  Check your feet daily for blisters, cuts, and redness. If you cannot see the bottom of your feet, use a mirror or ask someone for help.  Wash your feet with warm water (do not use hot water) and mild soap. Then pat your feet and the areas between your toes until they are completely dry. Do not soak your feet as this can dry your skin.  Apply a moisturizing lotion or petroleum jelly (that does not contain alcohol and is unscented) to the skin on your feet and to dry, brittle toenails. Do not apply lotion between your toes.  Trim your toenails straight across. Do not dig under them or around the cuticle. File the edges of your nails with an emery board or nail file.  Do not cut corns or calluses or try to remove them with medicine.  Wear clean socks or stockings every day. Make sure they are not too tight. Do not wear knee-high stockings since they may decrease blood flow to your legs.  Wear shoes that fit properly and have enough cushioning. To break in new shoes, wear them for just a few hours a day. This prevents you from injuring your feet. Always look in your shoes before you put them on to be sure there are no objects inside.  Do not cross your legs. This may decrease the blood flow to your feet.  If you find a minor scrape,  cut, or break in the skin on your feet, keep it and the skin around it clean and dry. These areas may be cleansed with mild soap and water. Do not cleanse the area with peroxide, alcohol, or iodine.  When you remove an adhesive bandage, be sure not to damage the skin around it.  If you have a wound, look at it several times a day to make sure it is healing.  Do not use heating pads or hot water bottles. They may burn your skin. If you have lost feeling in your feet or legs, you may not know it is happening until it is too late.  Make sure your health care provider performs a complete foot exam at least annually or more often if you have foot problems. Report any cuts, sores, or bruises to your health care provider immediately. SEEK MEDICAL CARE IF:   You have an injury that is not healing.  You have cuts or breaks in the skin.  You have an ingrown nail.  You notice redness on your legs or feet.  You feel burning or tingling in your legs or feet.  You have pain or cramps in your legs and feet.  Your legs or feet are numb.  Your feet always feel cold. SEEK IMMEDIATE MEDICAL CARE IF:   There is increasing redness,   swelling, or pain in or around a wound.  There is a red line that goes up your leg.  Pus is coming from a wound.  You develop a fever or as directed by your health care provider.  You notice a bad smell coming from an ulcer or wound. Document Released: 12/02/2000 Document Revised: 08/07/2013 Document Reviewed: 05/14/2013 [facility] Patient Information 2014 [place], [place].    For the painful keratosis or corn second digit right foot maintain Neosporin and Band-Aid dressing for 2 or 3 days following today's visit. Wash the toe with warm soapy water apply Neosporin and Band-Aid dressing also maintain the tube foam padding which is dispensed if this exacerbates or gets more pain for symptomatic any fever chills or discharge or drainage were to start not weight 3 months for  palliative care coming immediately for emergency care.

## 2013-12-19 ENCOUNTER — Telehealth: Payer: Self-pay | Admitting: Family Medicine

## 2013-12-19 DIAGNOSIS — E785 Hyperlipidemia, unspecified: Secondary | ICD-10-CM

## 2013-12-19 DIAGNOSIS — I10 Essential (primary) hypertension: Secondary | ICD-10-CM

## 2013-12-19 DIAGNOSIS — E119 Type 2 diabetes mellitus without complications: Secondary | ICD-10-CM

## 2013-12-19 NOTE — Telephone Encounter (Signed)
Message copied by Abner Greenspan on Thu Dec 19, 2013  1:25 PM ------      Message from: [name]      Created: Wed Dec 11, 2013 10:51 AM      Regarding: Lab orders for Friday, 1.2.15       Lab orders for a 6 month f/u ------

## 2013-12-20 ENCOUNTER — Other Ambulatory Visit (INDEPENDENT_AMBULATORY_CARE_PROVIDER_SITE_OTHER): Payer: Medicare Other

## 2013-12-20 DIAGNOSIS — I10 Essential (primary) hypertension: Secondary | ICD-10-CM

## 2013-12-20 DIAGNOSIS — E785 Hyperlipidemia, unspecified: Secondary | ICD-10-CM

## 2013-12-20 DIAGNOSIS — E119 Type 2 diabetes mellitus without complications: Secondary | ICD-10-CM

## 2013-12-20 LAB — LIPID PANEL
Cholesterol: 155 mg/dL (ref 0–200)
HDL: 55 mg/dL (ref >39.00)
LDL Cholesterol: 86 mg/dL (ref 0–99)
Total CHOL/HDL Ratio: 3
Triglycerides: 70 mg/dL (ref 0.0–149.0)
VLDL: 14 mg/dL (ref 0.0–40.0)

## 2013-12-20 LAB — COMPREHENSIVE METABOLIC PANEL
ALBUMIN: 3.8 g/dL (ref 3.5–5.2)
ALT: 12 U/L (ref 0–35)
AST: 19 U/L (ref 0–37)
Alkaline Phosphatase: 56 U/L (ref 39–117)
BILIRUBIN TOTAL: 0.7 mg/dL (ref 0.3–1.2)
BUN: 23 mg/dL (ref 6–23)
CO2: 29 meq/L (ref 19–32)
Calcium: 9.5 mg/dL (ref 8.4–10.5)
Chloride: 103 mEq/L (ref 96–112)
Creatinine, Ser: 0.9 mg/dL (ref 0.4–1.2)
GFR: 66.88 mL/min (ref >60.00)
GLUCOSE: 101 mg/dL — AB (ref 70–99)
Potassium: 4.4 mEq/L (ref 3.5–5.1)
SODIUM: 139 meq/L (ref 135–145)
TOTAL PROTEIN: 6.6 g/dL (ref 6.0–8.3)

## 2013-12-20 LAB — HEMOGLOBIN A1C: HEMOGLOBIN A1C: 6.7 % — AB (ref 4.6–6.5)

## 2013-12-22 ENCOUNTER — Encounter (HOSPITAL_COMMUNITY): Payer: Self-pay | Admitting: Emergency Medicine

## 2013-12-22 ENCOUNTER — Emergency Department (HOSPITAL_COMMUNITY)
Admission: EM | Admit: 2013-12-22 | Discharge: 2013-12-22 | Disposition: A | Discharge status: Home / Self Care | Payer: Medicare Other | Attending: Emergency Medicine | Admitting: Emergency Medicine

## 2013-12-22 DIAGNOSIS — M949 Disorder of cartilage, unspecified: Secondary | ICD-10-CM

## 2013-12-22 DIAGNOSIS — Z85828 Personal history of other malignant neoplasm of skin: Secondary | ICD-10-CM | POA: Insufficient documentation

## 2013-12-22 DIAGNOSIS — N390 Urinary tract infection, site not specified: Secondary | ICD-10-CM

## 2013-12-22 DIAGNOSIS — E1169 Type 2 diabetes mellitus with other specified complication: Secondary | ICD-10-CM | POA: Insufficient documentation

## 2013-12-22 DIAGNOSIS — Z79899 Other long term (current) drug therapy: Secondary | ICD-10-CM | POA: Insufficient documentation

## 2013-12-22 DIAGNOSIS — E785 Hyperlipidemia, unspecified: Secondary | ICD-10-CM | POA: Insufficient documentation

## 2013-12-22 DIAGNOSIS — Z8679 Personal history of other diseases of the circulatory system: Secondary | ICD-10-CM | POA: Insufficient documentation

## 2013-12-22 DIAGNOSIS — K219 Gastro-esophageal reflux disease without esophagitis: Secondary | ICD-10-CM | POA: Insufficient documentation

## 2013-12-22 DIAGNOSIS — F0280 Dementia in other diseases classified elsewhere without behavioral disturbance: Secondary | ICD-10-CM | POA: Insufficient documentation

## 2013-12-22 DIAGNOSIS — Z7982 Long term (current) use of aspirin: Secondary | ICD-10-CM | POA: Insufficient documentation

## 2013-12-22 DIAGNOSIS — G309 Alzheimer's disease, unspecified: Secondary | ICD-10-CM | POA: Insufficient documentation

## 2013-12-22 DIAGNOSIS — M899 Disorder of bone, unspecified: Secondary | ICD-10-CM | POA: Insufficient documentation

## 2013-12-22 LAB — CBC WITH DIFFERENTIAL/PLATELET
Basophils Absolute: 0 K/uL (ref 0.0–0.1)
Basophils Relative: 0 % (ref 0–1)
Eosinophils Absolute: 0.2 K/uL (ref 0.0–0.7)
Eosinophils Relative: 2 % (ref 0–5)
HCT: 36.7 % (ref 36.0–46.0)
Hemoglobin: 11.8 g/dL — ABNORMAL LOW (ref 12.0–15.0)
Lymphocytes Relative: 18 % (ref 12–46)
Lymphs Abs: 1.7 K/uL (ref 0.7–4.0)
MCH: 29.4 pg (ref 26.0–34.0)
MCHC: 32.2 g/dL (ref 30.0–36.0)
MCV: 91.5 fL (ref 78.0–100.0)
Monocytes Absolute: 1 K/uL (ref 0.1–1.0)
Monocytes Relative: 11 % (ref 3–12)
Neutro Abs: 6.2 K/uL (ref 1.7–7.7)
Neutrophils Relative %: 68 % (ref 43–77)
Platelets: 286 K/uL (ref 150–400)
RBC: 4.01 MIL/uL (ref 3.87–5.11)
RDW: 13.9 % (ref 11.5–15.5)
WBC: 9.1 K/uL (ref 4.0–10.5)

## 2013-12-22 LAB — BASIC METABOLIC PANEL WITH GFR
BUN: 26 mg/dL — ABNORMAL HIGH (ref 6–23)
CO2: 27 meq/L (ref 19–32)
Calcium: 9.6 mg/dL (ref 8.4–10.5)
Chloride: 101 meq/L (ref 96–112)
Creatinine, Ser: 0.88 mg/dL (ref 0.50–1.10)
GFR calc Af Amer: 66 mL/min — ABNORMAL LOW
GFR calc non Af Amer: 57 mL/min — ABNORMAL LOW
Glucose, Bld: 129 mg/dL — ABNORMAL HIGH (ref 70–99)
Potassium: 4.6 meq/L (ref 3.7–5.3)
Sodium: 140 meq/L (ref 137–147)

## 2013-12-22 LAB — GLUCOSE, CAPILLARY
GLUCOSE-CAPILLARY: 56 mg/dL — AB (ref 70–99)
Glucose-Capillary: 128 mg/dL — ABNORMAL HIGH (ref 70–99)
Glucose-Capillary: 173 mg/dL — ABNORMAL HIGH (ref 70–99)

## 2013-12-22 LAB — URINALYSIS, ROUTINE W REFLEX MICROSCOPIC
Glucose, UA: 100 mg/dL — AB
Hgb urine dipstick: NEGATIVE
KETONES UR: 15 mg/dL — AB
Nitrite: POSITIVE — AB
PROTEIN: NEGATIVE mg/dL
Specific Gravity, Urine: 1.024 (ref 1.005–1.030)
Urobilinogen, UA: 0.2 mg/dL (ref 0.0–1.0)
pH: 5 (ref 5.0–8.0)

## 2013-12-22 LAB — URINE MICROSCOPIC-ADD ON

## 2013-12-22 MED ORDER — CEFUROXIME AXETIL 500 MG PO TABS: 500.0000 mg | ORAL_TABLET | Freq: Two times a day (BID) | ORAL | Status: DC

## 2013-12-22 MED ORDER — DEXTROSE 50 % IV SOLN
50.0000 mL | Freq: Once | INTRAVENOUS | Status: AC
  Administered 2013-12-22: 25 mL via INTRAVENOUS
  Filled 2013-12-22: qty 50

## 2013-12-22 MED ORDER — CEFUROXIME AXETIL 500 MG PO TABS
500.0000 mg | ORAL_TABLET | Freq: Two times a day (BID) | ORAL | Status: DC
  Administered 2013-12-22: 500 mg via ORAL
  Filled 2013-12-22 (×2): qty 1

## 2013-12-22 NOTE — ED Notes (Signed)
CBG reads 128. Roland Earl RN aware.

## 2013-12-22 NOTE — ED Notes (Signed)
Per EMS, according to nurse tech stated pt woke up this am and did normal routine and around 12 sts not acting normal. sts normally up and ambulating and talking. Pt wouldn't speak. Currently no slurred speech. sts having selective hearing. Equal grips and no facial droop. Pt moving everything appropriately.

## 2013-12-22 NOTE — ED Notes (Signed)
CBG reads Dorado and Dr.Belfi aware.

## 2013-12-22 NOTE — Discharge Instructions (Signed)
Urinary Tract Infection We have sent urine for culture. We will call if the antibiotic needs to be changed. Blood sugar today was low upon arrival here at 56. Make sure that Ms.Shaheed eats all meals. Check her blood sugar in the morning upon awakening and at bedtime. If less than 60, feet her something sweet immediately and recheck blood sugar and 30 minutes. If he can't get blood sugar above 90, return to the emergency department. Call Dr.Tower's office to arrange to get her seen this week. Keep a running talley of her blood sugars  A urinary tract infection (UTI) can occur any place along the urinary tract. The tract includes the kidneys, ureters, bladder, and urethra. A type of germ called bacteria often causes a UTI. UTIs are often helped with antibiotic medicine.  HOME CARE   If given, take antibiotics as told by your doctor. Finish them even if you start to feel better.  Drink enough fluids to keep your pee (urine) clear or pale yellow.  Avoid tea, drinks with caffeine, and bubbly (carbonated) drinks.  Pee often. Avoid holding your pee in for a long time.  Pee before and after having sex (intercourse).  Wipe from front to back after you poop (bowel movement) if you are a woman. Use each tissue only once. GET HELP RIGHT AWAY IF:   You have back pain.  You have lower belly (abdominal) pain.  You have chills.  You feel sick to your stomach (nauseous).  You throw up (vomit).  Your burning or discomfort with peeing does not go away.  You have a fever.  Your symptoms are not better in 3 days. MAKE SURE YOU:   Understand these instructions.  Will watch your condition.  Will get help right away if you are not doing well or get worse. Document Released: 05/23/2008 Document Revised: 08/29/2012 Document Reviewed: 07/05/2012 [facility] Patient Information 2014 [place], [place].

## 2013-12-22 NOTE — ED Provider Notes (Signed)
CSN: [id number]     Arrival date & time 12/22/13  1421 History   First MD Initiated Contact with Patient 12/22/13 1547     Chief Complaint  Patient presents with  . Altered Mental Status   (Consider location/radiation/quality/duration/timing/severity/associated sxs/prior Treatment) HPI Level V caveat dementia history is obtained from patient's sister and niece who accompanied her. Patient ate breakfast this morning. After eating breakfast she went to sleep and would not wake up. She had CBG of 103 at home this morning. Patient woke up in the ambulance and looks appropriate and at her baseline now to her sister and niece who accompanied her.  Past Medical History  Diagnosis Date  . Depression     type 2  . Hyperlipidemia   . Osteopenia   . Memory loss, short term 3/09    mild cognitive impairment  . Abnormal CT of the chest 10/08    5 mm nodule- needs f/u CT in 1 year  . Squamous cell carcinoma of leg   . DM type 2 (diabetes mellitus, type 2)   . Dysrhythmia     "irregular at times"  . GERD (gastroesophageal reflux disease)   . Alzheimer disease     "severe" (08/08/2012)   Past Surgical History  Procedure Laterality Date  . Total hip arthroplasty  ? first OR; 02/2005    right X 2  . Ischemic colitis  10/98    colonoscopy  . Squamous cell carcinoma excision  10/2001    on leg  . Pelvic fracture surgery  07/2002    after a fall  . Cataract extraction, bilateral  2010  . Dexa  11/01    osteopenia  . Vaginal hysterectomy  1972  . Bladder suspension  02/2001    pelvic vaginal sling for incontinence   Family History  Problem Relation Age of Onset  . Stroke Mother   . Dementia Mother   . Stroke Father   . Breast cancer Sister   . Prostate cancer Brother   . Breast cancer Sister   . Memory loss Sister   . Breast cancer Other     neice  . Alzheimer's disease Sister     older sister  . Memory loss Sister     mild   History  Substance Use Topics  . Smoking status:  Never Smoker   . Smokeless tobacco: Never Used  . Alcohol Use: No   OB History   Grav Para Term Preterm Abortions TAB SAB Ect Mult Living                 Review of Systems  Unable to perform ROS: Dementia    Allergies  Alendronate sodium and Risedronate sodium  Home Medications   Current Outpatient Rx  Name  Route  Sig  Dispense  Refill  . aspirin 81 MG EC tablet   Oral   Take 81 mg by mouth daily.           Marland Kitchen atorvastatin (LIPITOR) 10 MG tablet   Oral   Take 1 tablet (10 mg total) by mouth at bedtime.   30 tablet   11   . calcitonin, salmon, (MIACALCIN/FORTICAL) 200 UNIT/ACT nasal spray   Nasal   Place 1 spray into the nose daily.   3.7 mL   11   . Calcium Carbonate-Vit D-Min (CALTRATE 600+D PLUS) 600-400 MG-UNIT per tablet   Oral   Take 1 tablet by mouth 2 (two) times daily.           Marland Kitchen  donepezil (ARICEPT) 5 MG tablet   Oral   Take 1 tablet (5 mg total) by mouth at bedtime.   90 tablet   3   . Memantine HCl ER (NAMENDA XR) 28 MG CP24   Oral   Take 28 mg by mouth daily.   90 capsule   3   . metFORMIN (GLUCOPHAGE) 500 MG tablet   Oral   Take 500 mg by mouth 2 (two) times daily with a meal.         . pantoprazole (PROTONIX) 40 MG tablet   Oral   Take 40 mg by mouth daily.         Marland Kitchen glucose blood test strip      Use to check blood sugar daily and as directed. Dx 250.00   100 each   6    BP 148/62  Pulse 81  Temp(Src) 97.9 F (36.6 C) (Oral)  Resp 23  SpO2 99% Physical Exam  Nursing note and vitals reviewed. Constitutional: She appears well-developed and well-nourished. No distress.  Chronically ill appearing  HENT:  Head: Normocephalic and atraumatic.  Eyes: Conjunctivae are normal. Pupils are equal, round, and reactive to light.  Neck: Neck supple. No tracheal deviation present. No thyromegaly present.  Cardiovascular: Normal rate and regular rhythm.   No murmur heard. Pulmonary/Chest: Effort normal and breath sounds normal.   Abdominal: Soft. Bowel sounds are normal. She exhibits no distension. There is no tenderness.  Musculoskeletal: Normal range of motion. She exhibits no edema and no tenderness.  Neurological: She is alert. Coordination normal.  Follows simple commands moves all extremities  Skin: Skin is warm and dry. No rash noted.  Psychiatric: She has a normal mood and affect.    ED Course  Procedures (including critical care time) Labs Review Labs Reviewed  GLUCOSE, CAPILLARY - Abnormal; Notable for the following:    Glucose-Capillary 56 (*)    All other components within normal limits  CBC WITH DIFFERENTIAL - Abnormal; Notable for the following:    Hemoglobin 11.8 (*)    All other components within normal limits  GLUCOSE, CAPILLARY - Abnormal; Notable for the following:    Glucose-Capillary 128 (*)    All other components within normal limits  BASIC METABOLIC PANEL  URINALYSIS, ROUTINE W REFLEX MICROSCOPIC   Imaging Review No results found.  EKG Interpretation   None      CBG was 56 upon arrival here consistent with mild hypoglycemia. Patient treated with D50 1/2 amp intravenously and given a meal which  she ate in full  6:10 PM patient resting comfortably. Alert no distress Results for orders placed during the hospital encounter of 12/22/13  GLUCOSE, CAPILLARY      Result Value Range   Glucose-Capillary 56 (*) 70 - 99 mg/dL  CBC WITH DIFFERENTIAL      Result Value Range   WBC 9.1  4.0 - 10.5 K/uL   RBC 4.01  3.87 - 5.11 MIL/uL   Hemoglobin 11.8 (*) 12.0 - 15.0 g/dL   HCT 36.7  36.0 - 46.0 %   MCV 91.5  78.0 - 100.0 fL   MCH 29.4  26.0 - 34.0 pg   MCHC 32.2  30.0 - 36.0 g/dL   RDW 13.9  11.5 - 15.5 %   Platelets 286  150 - 400 K/uL   Neutrophils Relative % 68  43 - 77 %   Neutro Abs 6.2  1.7 - 7.7 K/uL   Lymphocytes Relative 18  12 - 46 %  Lymphs Abs 1.7  0.7 - 4.0 K/uL   Monocytes Relative 11  3 - 12 %   Monocytes Absolute 1.0  0.1 - 1.0 K/uL   Eosinophils Relative 2   0 - 5 %   Eosinophils Absolute 0.2  0.0 - 0.7 K/uL   Basophils Relative 0  0 - 1 %   Basophils Absolute 0.0  0.0 - 0.1 K/uL  BASIC METABOLIC PANEL      Result Value Range   Sodium 140  137 - 147 mEq/L   Potassium 4.6  3.7 - 5.3 mEq/L   Chloride 101  96 - 112 mEq/L   CO2 27  19 - 32 mEq/L   Glucose, Bld 129 (*) 70 - 99 mg/dL   BUN 26 (*) 6 - 23 mg/dL   Creatinine, Ser 0.88  0.50 - 1.10 mg/dL   Calcium 9.6  8.4 - 10.5 mg/dL   GFR calc non Af Amer 57 (*) >90 mL/min   GFR calc Af Amer 66 (*) >90 mL/min  URINALYSIS, ROUTINE W REFLEX MICROSCOPIC      Result Value Range   Color, Urine YELLOW  YELLOW   APPearance CLOUDY (*) CLEAR   Specific Gravity, Urine 1.024  1.005 - 1.030   pH 5.0  5.0 - 8.0   Glucose, UA 100 (*) NEGATIVE mg/dL   Hgb urine dipstick NEGATIVE  NEGATIVE   Bilirubin Urine SMALL (*) NEGATIVE   Ketones, ur 15 (*) NEGATIVE mg/dL   Protein, ur NEGATIVE  NEGATIVE mg/dL   Urobilinogen, UA 0.2  0.0 - 1.0 mg/dL   Nitrite POSITIVE (*) NEGATIVE   Leukocytes, UA MODERATE (*) NEGATIVE  GLUCOSE, CAPILLARY      Result Value Range   Glucose-Capillary 128 (*) 70 - 99 mg/dL  URINE MICROSCOPIC-ADD ON      Result Value Range   Squamous Epithelial / LPF RARE  RARE   WBC, UA 21-50  <3 WBC/hpf   RBC / HPF 0-2  <3 RBC/hpf   Bacteria, UA MANY (*) RARE   No results found.  MDM  No diagnosis found. Plan prescription Ceftin Urine sent for culture followup Dr. Glori Bickers Diagnosis #1 hypoglycemia #2 urinary tract infection    Orlie Dakin, MD 12/22/13 1700

## 2013-12-23 ENCOUNTER — Telehealth: Payer: Self-pay | Admitting: Family Medicine

## 2013-12-23 NOTE — Telephone Encounter (Signed)
Thanks for the update - will see her then

## 2013-12-23 NOTE — Telephone Encounter (Signed)
Pt's sister called and wanted to let you know that she couldn't get pt awaken so they took her [facility].  The dr there says pt had a UTI and treated her for it.  Pt's sister just wanted to make you aware.  Pt already had an apptmt scheduled for Friday, 12/27/2013. Thank you.

## 2013-12-24 LAB — URINE CULTURE

## 2013-12-27 ENCOUNTER — Ambulatory Visit (INDEPENDENT_AMBULATORY_CARE_PROVIDER_SITE_OTHER): Payer: Medicare Other | Admitting: Family Medicine

## 2013-12-27 ENCOUNTER — Encounter: Payer: Self-pay | Admitting: Family Medicine

## 2013-12-27 VITALS — BP 124/62 | HR 82 | Temp 97.9°F | Ht 61.0 in | Wt 119.2 lb

## 2013-12-27 DIAGNOSIS — I10 Essential (primary) hypertension: Secondary | ICD-10-CM

## 2013-12-27 DIAGNOSIS — E785 Hyperlipidemia, unspecified: Secondary | ICD-10-CM

## 2013-12-27 DIAGNOSIS — E119 Type 2 diabetes mellitus without complications: Secondary | ICD-10-CM

## 2013-12-27 DIAGNOSIS — N390 Urinary tract infection, site not specified: Secondary | ICD-10-CM

## 2013-12-27 NOTE — Progress Notes (Signed)
Pre-visit discussion using our clinic review tool. No additional management support is needed unless otherwise documented below in the visit note.  

## 2013-12-27 NOTE — Progress Notes (Signed)
Subjective:    Patient ID: [name], female    DOB: 10/02/1922, 80 y.o.   MRN: [id number]  HPI Here for f/u of chronic problems as well as ED visit   Pt was in ED on 12/22/13 for uti and MS change and hypoglycemia with glucose of 56 EMS  tx with ceftin Cx: e coli pan sensitive Doing much better  Does not remember being there  Is acting more like herself Great appetite   Gets up and reads the paper - back to normal Dementia is fairly stable    bp is stable today  No cp or palpitations or headaches or edema  No side effects to medicines  BP Readings from Last 3 Encounters:  12/27/13 124/62  12/22/13 133/55  12/17/13 116/72     Diabetes Home sugar results -one low sugar due to uti  Usually 100-110 occ higher -once was 170  DM diet - appetite is ok  Exercise -- walks with walker  -no symptoms  A1C last  Lab Results  Component Value Date   HGBA1C 6.7* 12/20/2013  very stable   No problems with medications - metformin  Renal protection Last eye exam a year ago -has appt next week with Dr Satira Sark     Hyperlipidemia Lab Results  Component Value Date   CHOL 155 12/20/2013   CHOL 149 12/21/2012   CHOL 154 07/20/2011   Lab Results  Component Value Date   HDL 55.00 12/20/2013   HDL 51.70 12/21/2012   HDL 57.70 07/20/2011   Lab Results  Component Value Date   LDLCALC 86 12/20/2013   LDLCALC 75 12/21/2012   LDLCALC 81 07/20/2011   Lab Results  Component Value Date   TRIG 70.0 12/20/2013   TRIG 113.0 12/21/2012   TRIG 76.0 07/20/2011   Lab Results  Component Value Date   CHOLHDL 3 12/20/2013   CHOLHDL 3 12/21/2012   CHOLHDL 3 07/20/2011   No results found for this basename: LDLDIRECT      Chemistry      Component Value Date/Time   NA 140 12/22/2013 1503   K 4.6 12/22/2013 1503   CL 101 12/22/2013 1503   CO2 27 12/22/2013 1503   BUN 26* 12/22/2013 1503   CREATININE 0.88 12/22/2013 1503      Component Value Date/Time   CALCIUM 9.6 12/22/2013 1503   ALKPHOS 56 12/20/2013 0856   AST 19  12/20/2013 0856   ALT 12 12/20/2013 0856   BILITOT 0.7 12/20/2013 0856      Lab Results  Component Value Date   WBC 9.1 12/22/2013   HGB 11.8* 12/22/2013   HCT 36.7 12/22/2013   MCV 91.5 12/22/2013   PLT 286 12/22/2013    Patient Active Problem List   Diagnosis Date Noted  . Urinary tract infection, site not specified 12/27/2013  . Venous insufficiency 08/17/2012  . Syncope 08/08/2012  . Dementia 08/08/2012  . Abnormal EKG 08/08/2012  . Essential hypertension, benign 12/22/2009  . OSTEOPOROSIS NOS 09/24/2007  . GERD 07/16/2007  . DIABETES MELLITUS, TYPE II 07/13/2007  . HYPERLIPIDEMIA 07/13/2007  . OVERACTIVE BLADDER 07/13/2007  . DEGENERATIVE JOINT DISEASE 07/13/2007  . INCONTINENCE, URGE 07/13/2007   Past Medical History  Diagnosis Date  . Depression     type 2  . Hyperlipidemia   . Osteopenia   . Memory loss, short term 3/09    mild cognitive impairment  . Abnormal CT of the chest 10/08    5 mm nodule-  needs f/u CT in 1 year  . Squamous cell carcinoma of leg   . DM type 2 (diabetes mellitus, type 2)   . Dysrhythmia     "irregular at times"  . GERD (gastroesophageal reflux disease)   . Alzheimer disease     "severe" (08/08/2012)   Past Surgical History  Procedure Laterality Date  . Total hip arthroplasty  ? first OR; 02/2005    right X 2  . Ischemic colitis  10/98    colonoscopy  . Squamous cell carcinoma excision  10/2001    on leg  . Pelvic fracture surgery  07/2002    after a fall  . Cataract extraction, bilateral  2010  . Dexa  11/01    osteopenia  . Vaginal hysterectomy  1972  . Bladder suspension  02/2001    pelvic vaginal sling for incontinence   History  Substance Use Topics  . Smoking status: Never Smoker   . Smokeless tobacco: Never Used  . Alcohol Use: No   Family History  Problem Relation Age of Onset  . Stroke Mother   . Dementia Mother   . Stroke Father   . Breast cancer Sister   . Prostate cancer Brother   . Breast cancer Sister   . Memory  loss Sister   . Breast cancer Other     neice  . Alzheimer's disease Sister     older sister  . Memory loss Sister     mild   Allergies  Allergen Reactions  . Alendronate Sodium     REACTION: GI; "don't really remember reaction"  . Risedronate Sodium     REACTION: GI; "don't really remember reaction"   Current Outpatient Prescriptions on File Prior to Visit  Medication Sig Dispense Refill  . aspirin 81 MG EC tablet Take 81 mg by mouth daily.        Marland Kitchen atorvastatin (LIPITOR) 10 MG tablet Take 1 tablet (10 mg total) by mouth at bedtime.  30 tablet  11  . calcitonin, salmon, (MIACALCIN/FORTICAL) 200 UNIT/ACT nasal spray Place 1 spray into the nose daily.  3.7 mL  11  . Calcium Carbonate-Vit D-Min (CALTRATE 600+D PLUS) 600-400 MG-UNIT per tablet Take 1 tablet by mouth 2 (two) times daily.        . cefUROXime (CEFTIN) 500 MG tablet Take 1 tablet (500 mg total) by mouth 2 (two) times daily with a meal.  14 tablet  0  . donepezil (ARICEPT) 5 MG tablet Take 1 tablet (5 mg total) by mouth at bedtime.  90 tablet  3  . glucose blood test strip Use to check blood sugar daily and as directed. Dx 250.00  100 each  6  . Memantine HCl ER (NAMENDA XR) 28 MG CP24 Take 28 mg by mouth daily.  90 capsule  3  . metFORMIN (GLUCOPHAGE) 500 MG tablet Take 500 mg by mouth 2 (two) times daily with a meal.      . pantoprazole (PROTONIX) 40 MG tablet Take 40 mg by mouth daily.       No current facility-administered medications on file prior to visit.    Review of Systems Review of Systems  Constitutional: Negative for fever, appetite change, fatigue and unexpected weight change.  Eyes: Negative for pain and visual disturbance.  Respiratory: Negative for cough and shortness of breath.   Cardiovascular: Negative for cp or palpitations    Gastrointestinal: Negative for nausea, diarrhea and constipation.  Genitourinary: Negative for urgency and frequency. neg for  dysuria or hematuria or flank pain  Skin:  Negative for pallor or rash   Neurological: Negative for weakness, light-headedness, numbness and headaches.  Hematological: Negative for adenopathy. Does not bruise/bleed easily.  Psychiatric/Behavioral: Negative for dysphoric mood. The patient is not nervous/anxious. Pos for dementia with poor short term memory        Objective:   Physical Exam  Constitutional: She appears well-developed and well-nourished. No distress.  Elderly, frail appearing and well   HENT:  Head: Normocephalic and atraumatic.  Mouth/Throat: Oropharynx is clear and moist.  Eyes: Conjunctivae and EOM are normal. Pupils are equal, round, and reactive to light. Right eye exhibits no discharge. Left eye exhibits no discharge. No scleral icterus.  Neck: Normal range of motion. Neck supple. No JVD present.  Cardiovascular: Normal rate, regular rhythm and intact distal pulses.  Exam reveals no gallop.   Pulmonary/Chest: Effort normal and breath sounds normal. No respiratory distress. She has no wheezes. She exhibits no tenderness.  Abdominal: Soft. Bowel sounds are normal. She exhibits no distension and no mass. There is no tenderness.  No suprapubic tenderness or fullness   No cva tenderness   Musculoskeletal: She exhibits no edema.  Lymphadenopathy:    She has no cervical adenopathy.  Neurological: She is alert. She has normal reflexes. No cranial nerve deficit. Coordination normal.  Mild tremor   Skin: Skin is warm and dry. No rash noted. No erythema. No pallor.  Psychiatric: She has a normal mood and affect.          Assessment & Plan:

## 2013-12-27 NOTE — Patient Instructions (Signed)
Sugar control is great  Cholesterol control is good also  If any more blood sugars under 70- let me know Encourage regular meals and fluids  uti is better - if symptoms return (sleepy , not responsive or other mental status change)- please let us know or call EMS if necessary  Finish the whole course of antibiotic   Follow up in 6 months for annual exam with labs prior

## 2013-12-28 NOTE — Assessment & Plan Note (Signed)
BP: 124/62 mmHg   bp in fair control at this time  No changes needed Disc lifstyle change with low sodium diet and exercise

## 2013-12-28 NOTE — Assessment & Plan Note (Signed)
Clinically resolved after ceftin/ hosp  Disc imp of water intake Disc s/s to watch for for family/caregivers-particularly mental status change

## 2013-12-28 NOTE — Assessment & Plan Note (Signed)
Disc goals for lipids and reasons to control them Rev labs with pt Rev low sat fat diet in detail On lipitor and diet  

## 2013-12-28 NOTE — Assessment & Plan Note (Signed)
Lab Results  Component Value Date   HGBA1C 6.7* 12/20/2013   Sugar low when she went to ER with uti  No other times Continues metformin Good diet and appetite  Eye exam is soon F/u 6 mo

## 2014-01-02 ENCOUNTER — Telehealth: Payer: Self-pay

## 2014-01-02 NOTE — Telephone Encounter (Signed)
Relevant patient education mailed to patient.  

## 2014-01-07 ENCOUNTER — Other Ambulatory Visit: Payer: Self-pay | Admitting: Family Medicine

## 2014-01-20 ENCOUNTER — Other Ambulatory Visit: Payer: Self-pay | Admitting: Family Medicine

## 2014-02-10 ENCOUNTER — Telehealth: Payer: Self-pay | Admitting: Family Medicine

## 2014-02-10 NOTE — Telephone Encounter (Signed)
See prev note

## 2014-02-10 NOTE — Telephone Encounter (Signed)
It is up to her at her advanced age whether to continue cholesterol medicine or not (that is the lipitor / atorvastatin) She can have a chewable baby asa Could to chewable ca plus D - avail otc  Can crush the metformin (if they stopped that -glucose would go up some - they can decide- her DM is fairly well controlled ) Can try to crush the protonix   They will have to check with neurology about the aricept and namenda

## 2014-02-10 NOTE — Telephone Encounter (Signed)
Pt's power of attorney Gerlean Ren is calling concerning pt. She says Pt is having trouble taking medication and will not swallow it. She is wanting to know if someone would look at her medications and see which ones she could stop taking or really does not need and which ones can be crushed or cut into smaller pieces. Please advise

## 2014-02-12 NOTE — Telephone Encounter (Signed)
Left voicemail requesting Vaughan Basta to call office back

## 2014-02-12 NOTE — Telephone Encounter (Signed)
Linda left v/m requesting cb [phone].

## 2014-02-12 NOTE — Telephone Encounter (Signed)
Linda notified of Dr. Marliss Coots comments and verbalized understanding

## 2014-02-20 ENCOUNTER — Telehealth: Payer: Self-pay

## 2014-02-20 DIAGNOSIS — R8290 Unspecified abnormal findings in urine: Secondary | ICD-10-CM

## 2014-02-20 NOTE — Telephone Encounter (Signed)
[name] left v/m; pt is 18 and cannot get pt out for an appt today;(no available appts also). Pt has dementia and cannot tell if has burning or pain upon urination; pt in pull ups and cannot tell if going more often than usual. Urine has bad odor.no fever. Vaughan Basta wants to come to office for urine container and bring back urine specimen if Vaughan Basta can catch urine sample to see if pt needs medication.Please advise. [place].

## 2014-02-20 NOTE — Telephone Encounter (Signed)
Linda notified. 

## 2014-02-20 NOTE — Telephone Encounter (Signed)
I am okay with this if she can get a clean catch.  If isn't a clean catch, we shouldn't culture it.   I'll route to PCP as FYI.   Thanks.

## 2014-02-20 NOTE — Telephone Encounter (Signed)
Please have them attempt the best clean catch they can get and send for cx please

## 2014-02-22 LAB — URINE CULTURE: Colony Count: >=100000

## 2014-02-23 ENCOUNTER — Telehealth: Payer: Self-pay | Admitting: Family Medicine

## 2014-02-23 MED ORDER — CIPROFLOXACIN HCL 250 MG PO TABS: 250.0000 mg | ORAL_TABLET | Freq: Two times a day (BID) | ORAL | Status: DC

## 2014-02-23 NOTE — Telephone Encounter (Signed)
Urine cx is positive for infection  Please call in px for cipro and take for 5 d Let me know if no imp

## 2014-02-24 MED ORDER — CIPROFLOXACIN HCL 250 MG PO TABS: 250.0000 mg | ORAL_TABLET | Freq: Two times a day (BID) | ORAL | Status: DC

## 2014-02-24 NOTE — Telephone Encounter (Signed)
Linda notified of urine cx and Dr. Marliss Coots comments, Rx sent to pharmacy

## 2014-02-27 ENCOUNTER — Telehealth: Payer: Self-pay | Admitting: Family Medicine

## 2014-02-27 NOTE — Telephone Encounter (Signed)
Reviewed recent UCx pansens E coli. Seems like she has h/o urosepsis with hospitalization 12/2013, now under treatment with cipro 250mg  bid started 02/24/2014 - make sure taking med as prescribed. I doubt cipro reaction but make sure she's tolerated cipro in past ok. plz call to f/u - how is she feeling now - if persistent AMS or weakness rec ER eval o/w may f/u here if not improving after treatment, sooner if persistent worsening.

## 2014-02-27 NOTE — Telephone Encounter (Signed)
Patient's sister notified.

## 2014-02-27 NOTE — Telephone Encounter (Signed)
Patient Information:  Caller Name: Vaughan Basta  Phone: [phone]  PREFERRED CALLBACK NUMBER:  [phone] Sharyn Lull - sister)  Patient: Virlee, Stroschein  Gender: Female  DOB: [birth year]  Age: 80 Years  PCP: Loura Pardon [facility])  Office Follow Up:  Does the office need to follow up with this patient?: Yes  Instructions For The Office: Please review information and follow up with caller.   Symptoms  Reason For Call & Symptoms: Vaughan Basta / relative  calling.  States patient was so weak that she was not able to stand 02/26/14 pm. Current BP is 108/52.  She is able to stand; BP 30 minutes prior to this was 126/37.  She is currently at her usual level of alertness/ function per caller.  96 saturation, Heart rate 66.   She is on Cipro for UTI since 02/24/14.  Note to provider for follow up due to possible serious adverse reaction to Cipro:  (Cardiovascular: Cardiorespiratory arrest (up to 1% ), Myocardial infarction (up to 1% ), Prolonged QT interval, Syncope (up to 1% ), Torsades de pointes).  Reviewed Health History In EMR: Yes  Reviewed Medications In EMR: Yes  Reviewed Allergies In EMR: Yes  Reviewed Surgeries / Procedures: Yes  Date of Onset of Symptoms: 02/26/2014  Treatments Tried: Pedialyte and coffee  Treatments Tried Worked: Yes  Guideline(s) Used:  High Blood Pressure  No Protocol Available - Sick Adult  Disposition Per Guideline:   Discuss with PCP and Callback by Nurse within 1 Hour  Reason For Disposition Reached:   Nursing judgment  Advice Given:  Call Back If:  New symptoms develop  You become worse.  Patient Will Follow Care Advice:  YES

## 2014-02-28 NOTE — Telephone Encounter (Addendum)
Spoke with Lovenia Shuck (pt's care giver) and she advise me that pt is back to baseline. Pt is doing okay and they are still giving her the abx. Pt's weakness has resolved. [name] advise me that if sxs return she will call and let us know

## 2014-02-28 NOTE — Telephone Encounter (Signed)
Please see how she is feeling today-thanks

## 2014-03-18 ENCOUNTER — Ambulatory Visit (INDEPENDENT_AMBULATORY_CARE_PROVIDER_SITE_OTHER): Payer: Medicare Other

## 2014-03-18 ENCOUNTER — Ambulatory Visit: Payer: Medicare Other | Admitting: Neurology

## 2014-03-18 VITALS — BP 126/79 | HR 104 | Resp 16

## 2014-03-18 DIAGNOSIS — B351 Tinea unguium: Secondary | ICD-10-CM

## 2014-03-18 DIAGNOSIS — M79609 Pain in unspecified limb: Secondary | ICD-10-CM

## 2014-03-18 DIAGNOSIS — E1149 Type 2 diabetes mellitus with other diabetic neurological complication: Secondary | ICD-10-CM

## 2014-03-18 DIAGNOSIS — E1140 Type 2 diabetes mellitus with diabetic neuropathy, unspecified: Secondary | ICD-10-CM

## 2014-03-18 NOTE — Progress Notes (Signed)
   Subjective:    Patient ID: [name], female    DOB: [birth year], 80 y.o.   MRN: [id number]  HPI Comments: "Trim the toenails"     Review of Systems no new changes or findings     Objective:   Physical Exam Jos objective findings as follows masker status is intact although diminished thready pulses DP plus one over 4 PT nonpalpable bilateral temperature warm to cool bilateral Refill time 3 seconds to 4 seconds all digits mild to moderate varicosities noted mild pallor noted neurologically epicritic and proprioceptive sensations decreased on Semmes Weinstein testing digits forefoot and arch. There is hypersensitivity the toes on palpation and debridement at this time. Nails thick brittle crumbly friable 1 through 5 bilateral painful tender and symptomatic and closure were and open wounds or ulcerations patient is a history of digital contractures with history of distal ulceration distal clavus. No open wounds no secondary infections no lymphangitis       Assessment & Plan:  Assessment this time his diabetes with peripheral neuropathy and some angiopathy. Patient does have thick brittle criptotic nails which are debrided at this time followup in 3 months for continued palliative nail care is needed maintain accommodative shoes as instructed  Harriet Masson DPM

## 2014-03-18 NOTE — Patient Instructions (Signed)
Diabetes and Foot Care Diabetes may cause you to have problems because of poor blood supply (circulation) to your feet and legs. This may cause the skin on your feet to become thinner, break easier, and heal more slowly. Your skin may become dry, and the skin may peel and crack. You may also have nerve damage in your legs and feet causing decreased feeling in them. You may not notice minor injuries to your feet that could lead to infections or more serious problems. Taking care of your feet is one of the most important things you can do for yourself.  HOME CARE INSTRUCTIONS  Wear shoes at all times, even in the house. Do not go barefoot. Bare feet are easily injured.  Check your feet daily for blisters, cuts, and redness. If you cannot see the bottom of your feet, use a mirror or ask someone for help.  Wash your feet with warm water (do not use hot water) and mild soap. Then pat your feet and the areas between your toes until they are completely dry. Do not soak your feet as this can dry your skin.  Apply a moisturizing lotion or petroleum jelly (that does not contain alcohol and is unscented) to the skin on your feet and to dry, brittle toenails. Do not apply lotion between your toes.  Trim your toenails straight across. Do not dig under them or around the cuticle. File the edges of your nails with an emery board or nail file.  Do not cut corns or calluses or try to remove them with medicine.  Wear clean socks or stockings every day. Make sure they are not too tight. Do not wear knee-high stockings since they may decrease blood flow to your legs.  Wear shoes that fit properly and have enough cushioning. To break in new shoes, wear them for just a few hours a day. This prevents you from injuring your feet. Always look in your shoes before you put them on to be sure there are no objects inside.  Do not cross your legs. This may decrease the blood flow to your feet.  If you find a minor scrape,  cut, or break in the skin on your feet, keep it and the skin around it clean and dry. These areas may be cleansed with mild soap and water. Do not cleanse the area with peroxide, alcohol, or iodine.  When you remove an adhesive bandage, be sure not to damage the skin around it.  If you have a wound, look at it several times a day to make sure it is healing.  Do not use heating pads or hot water bottles. They may burn your skin. If you have lost feeling in your feet or legs, you may not know it is happening until it is too late.  Make sure your health care provider performs a complete foot exam at least annually or more often if you have foot problems. Report any cuts, sores, or bruises to your health care provider immediately. SEEK MEDICAL CARE IF:   You have an injury that is not healing.  You have cuts or breaks in the skin.  You have an ingrown nail.  You notice redness on your legs or feet.  You feel burning or tingling in your legs or feet.  You have pain or cramps in your legs and feet.  Your legs or feet are numb.  Your feet always feel cold. SEEK IMMEDIATE MEDICAL CARE IF:   There is increasing redness,   swelling, or pain in or around a wound.  There is a red line that goes up your leg.  Pus is coming from a wound.  You develop a fever or as directed by your health care provider.  You notice a bad smell coming from an ulcer or wound. Document Released: 12/02/2000 Document Revised: 08/07/2013 Document Reviewed: 05/14/2013 [employer] Patient Information 2014 [employer].  

## 2014-04-02 ENCOUNTER — Encounter: Payer: Self-pay | Admitting: Internal Medicine

## 2014-04-02 ENCOUNTER — Ambulatory Visit (INDEPENDENT_AMBULATORY_CARE_PROVIDER_SITE_OTHER): Payer: Medicare Other | Admitting: Internal Medicine

## 2014-04-02 VITALS — BP 120/76 | HR 94 | Temp 97.4°F | Ht 61.0 in | Wt 121.0 lb

## 2014-04-02 DIAGNOSIS — Z0000 Encounter for general adult medical examination without abnormal findings: Secondary | ICD-10-CM

## 2014-04-02 NOTE — Patient Instructions (Addendum)
Dementia Dementia is a general term for problems with brain function. A person with dementia has memory loss and a hard time with at least one other brain function such as thinking, speaking, or problem solving. Dementia can affect social functioning, how you do your job, your mood, or your personality. The changes may be hidden for a long time. The earliest forms of this disease are usually not detected by family or friends. Dementia can be:  Irreversible.  Potentially reversible.  Partially reversible.  Progressive. This means it can get worse over time. CAUSES  Irreversible dementia causes may include:  Degeneration of brain cells (Alzheimer's disease or lewy body dementia).  Multiple small strokes (vascular dementia).  Infection (chronic meningitis or Creutzfelt-Jakob disease).  Frontotemporal dementia. This affects younger people, age 40 to 70, compared to those who have Alzheimer's disease.  Dementia associated with other disorders like Parkinson's disease, Huntington's disease, or HIV-associated dementia. Potentially or partially reversible dementia causes may include:  Medicines.  Metabolic causes such as excessive alcohol intake, vitamin B12 deficiency, or thyroid disease.  Masses or pressure in the brain such as a tumor, blood clot, or hydrocephalus. SYMPTOMS  Symptoms are often hard to detect. Family members or coworkers may not notice them early in the disease process. Different people with dementia may have different symptoms. Symptoms can include:  A hard time with memory, especially recent memory. Long-term memory may not be impaired.  Asking the same question multiple times or forgetting something someone just said.  A hard time speaking your thoughts or finding certain words.  A hard time solving problems or performing familiar tasks (such as how to use a telephone).  Sudden changes in mood.  Changes in personality, especially increasing moodiness or  mistrust.  Depression.  A hard time understanding complex ideas that were never a problem in the past. DIAGNOSIS  There are no specific tests for dementia.   Your caregiver may recommend a thorough evaluation. This is because some forms of dementia can be reversible. The evaluation will likely include a physical exam and getting a detailed history from you and a family member. The history often gives the best clues and suggestions for a diagnosis.  Memory testing may be done. A detailed brain function evaluation called neuropsychologic testing may be helpful.  Lab tests and brain imaging (such as a CT scan or MRI scan) are sometimes important.  Sometimes observation and re-evaluation over time is very helpful. TREATMENT  Treatment depends on the cause.   If the problem is a vitamin deficiency, it may be helped or cured with supplements.  For dementias such as Alzheimer's disease, medicines are available to stabilize or slow the course of the disease. There are no cures for this type of dementia.  Your caregiver can help direct you to groups, organizations, and other caregivers to help with decisions in the care of you or your loved one. HOME CARE INSTRUCTIONS The care of individuals with dementia is varied and dependent upon the progression of the dementia. The following suggestions are intended for the person living with, or caring for, the person with dementia.  Create a safe environment.  Remove the locks on bathroom doors to prevent the person from accidentally locking himself or herself in.  Use childproof latches on kitchen cabinets and any place where cleaning supplies, chemicals, or alcohol are kept.  Use childproof covers in unused electrical outlets.  Install childproof devices to keep doors and windows secured.  Remove stove knobs or install safety   knobs and an automatic shut-off on the stove.  Lower the temperature on water heaters.  Label medicines and keep them  locked up.  Secure knives, lighters, matches, power tools, and guns, and keep these items out of reach.  Keep the house free from clutter. Remove rugs or anything that might contribute to a fall.  Remove objects that might break and hurt the person.  Make sure lighting is good, both inside and outside.  Install grab rails as needed.  Use a monitoring device to alert you to falls or other needs for help.  Reduce confusion.  Keep familiar objects and people around.  Use night lights or dim lights at night.  Label items or areas.  Use reminders, notes, or directions for daily activities or tasks.  Keep a simple, consistent routine for waking, meals, bathing, dressing, and bedtime.  Create a calm, quiet environment.  Place large clocks and calendars prominently.  Display emergency numbers and home address near all telephones.  Use cues to establish different times of the day. An example is to open curtains to let the natural light in during the day.   Use effective communication.  Choose simple words and short sentences.  Use a gentle, calm tone of voice.  Be careful not to interrupt.  If the person is struggling to find a word or communicate a thought, try to provide the word or thought.  Ask one question at a time. Allow the person ample time to answer questions. Repeat the question again if the person does not respond.  Reduce nighttime restlessness.  Provide a comfortable bed.  Have a consistent nighttime routine.  Ensure a regular walking or physical activity schedule. Involve the person in daily activities as much as possible.  Limit napping during the day.  Limit caffeine.  Attend social events that stimulate rather than overwhelm the senses.  Encourage good nutrition and hydration.  Reduce distractions during meal times and snacks.  Avoid foods that are too hot or too cold.  Monitor chewing and swallowing ability.  Continue with routine vision,  hearing, dental, and medical screenings.  Only give over-the-counter or prescription medicines as directed by the caregiver.  Monitor driving abilities. Do not allow the person to drive when safe driving is no longer possible.  Register with an identification program which could provide location assistance in the event of a missing person situation. SEEK MEDICAL CARE IF:   New behavioral problems start such as moodiness, aggressiveness, or seeing things that are not there (hallucinations).  Any new problem with brain function happens. This includes problems with balance, speech, or falling a lot.  Problems with swallowing develop.  Any symptoms of other illness happen. Small changes or worsening in any aspect of brain function can be a sign that the illness is getting worse. It can also be a sign of another medical illness such as infection. Seeing a caregiver right away is important. SEEK IMMEDIATE MEDICAL CARE IF:   A fever develops.  New or worsened confusion develops.  New or worsened sleepiness develops.  Staying awake becomes hard to do. Document Released: 05/31/2001 Document Revised: 02/27/2012 Document Reviewed: 05/02/2011 Exit[employer] Patient Information 2014 [employer].  

## 2014-04-02 NOTE — Progress Notes (Signed)
Subjective:    Patient ID: [name], female    DOB: 1922-09-14, 80 y.o.   MRN: [id number]  HPI  Pt presents to the clinic today for her annual physical. She has no concerns today.  Flu: 08/2013 Tetanus: 2010 Pneumovax: 2008 Zostovax: 2014 Mammogram: 2012 Bone Density: 2003 Eye Doctor: yearly Dentist: as needed  Review of Systems      Past Medical History  Diagnosis Date  . Depression     type 2  . Hyperlipidemia   . Osteopenia   . Memory loss, short term 3/09    mild cognitive impairment  . Abnormal CT of the chest 10/08    5 mm nodule- needs f/u CT in 1 year  . Squamous cell carcinoma of leg   . DM type 2 (diabetes mellitus, type 2)   . Dysrhythmia     "irregular at times"  . GERD (gastroesophageal reflux disease)   . Alzheimer disease     "severe" (08/08/2012)    Current Outpatient Prescriptions  Medication Sig Dispense Refill  . atorvastatin (LIPITOR) 10 MG tablet TAKE ONE TABLET BY MOUTH EVERY NIGHT AT BEDTIME  30 tablet  5  . calcitonin, salmon, (MIACALCIN/FORTICAL) 200 UNIT/ACT nasal spray USE 1 SPRAY IN THE NOSE EVERY DAY *ALTERNATING NOSTRILS*  3.7 mL  5  . Calcium Carbonate-Vit D-Min (CALTRATE 600+D PLUS) 600-400 MG-UNIT per tablet Take 1 tablet by mouth 2 (two) times daily.        Marland Kitchen donepezil (ARICEPT) 5 MG tablet Take 1 tablet (5 mg total) by mouth at bedtime.  90 tablet  3  . glucose blood test strip Use to check blood sugar daily and as directed. Dx 250.00  100 each  6  . Memantine HCl ER (NAMENDA XR) 28 MG CP24 Take 28 mg by mouth daily.  90 capsule  3  . metFORMIN (GLUCOPHAGE) 500 MG tablet TAKE 1 TABLET BY MOUTH TWICE A DAY WITH MEALS  60 tablet  5  . pantoprazole (PROTONIX) 40 MG tablet TAKE ONE TABLET BY MOUTH EVERY MORNING  30 tablet  5   No current facility-administered medications for this visit.    Allergies  Allergen Reactions  . Alendronate Sodium     REACTION: GI; "don't really remember reaction"  . Risedronate Sodium    REACTION: GI; "don't really remember reaction"    Family History  Problem Relation Age of Onset  . Stroke Mother   . Dementia Mother   . Stroke Father   . Breast cancer Sister   . Prostate cancer Brother   . Breast cancer Sister   . Memory loss Sister   . Breast cancer Other     neice  . Alzheimer's disease Sister     older sister  . Memory loss Sister     mild    History   Social History  . Marital Status: Single    Spouse Name: N/A    Number of Children: 0  . Years of Education: N/A   Occupational History  . Retired Education officer, museum    Social History Main Topics  . Smoking status: Never Smoker   . Smokeless tobacco: Never Used  . Alcohol Use: No  . Drug Use: No  . Sexual Activity: Not on file   Other Topics Concern  . Not on file   Social History Narrative   Lives with her sister who helps care for her (and another sister).   Involved in church activities  Constitutional: Denies fever, malaise, fatigue, headache or abrupt weight changes.  HEENT: Denies eye pain, eye redness, ear pain, ringing in the ears, wax buildup, runny nose, nasal congestion, bloody nose, or sore throat. Respiratory: Denies difficulty breathing, shortness of breath, cough or sputum production.   Cardiovascular: Denies chest pain, chest tightness, palpitations or swelling in the hands or feet.  Gastrointestinal: Pt has had some bowel incontinence. Denies abdominal pain, bloating, constipation, diarrhea or blood in the stool.  GU: Pt has had some urine incontinence. Denies urgency, frequency, pain with urination, burning sensation, blood in urine, odor or discharge. Musculoskeletal: Denies decrease in range of motion, difficulty with gait, muscle pain or joint pain and swelling.  Skin: Denies redness, rashes, lesions or ulcercations.  Neurological: Pt has severe memory problems, problems with balance and coordination. Denies dizziness, difficulty with speech.   No other specific  complaints in a complete review of systems (except as listed in HPI above).  Objective:   Physical Exam  BP 120/76  Pulse 94  Temp(Src) 97.4 F (36.3 C) (Oral)  Ht 5\' 1"  (1.549 m)  Wt 121 lb (54.885 kg)  BMI 22.87 kg/m2  SpO2 98% Wt Readings from Last 3 Encounters:  04/02/14 121 lb (54.885 kg)  12/27/13 119 lb 4 oz (54.091 kg)  09/16/13 119 lb (53.978 kg)    General: Appears her stated age, chronically ill appearing in NAD. HEENT: Head: normal shape and size; Eyes: sclera white, no icterus, conjunctiva pink, PERRLA and EOMs intact; Ears: Tm's gray and intact, normal light reflex; Nose: mucosa pink and moist, septum midline; Throat/Mouth: Teeth present, mucosa pink and moist, no exudate, lesions or ulcerations noted. .  Cardiovascular: Normal rate and rhythm. S1,S2 noted.  No murmur, rubs or gallops noted. No JVD or BLE edema. No carotid bruits noted. Pulmonary/Chest: Normal effort and positive vesicular breath sounds. No respiratory distress. No wheezes, rales or ronchi noted.  Abdomen: Soft and nontender. Normal bowel sounds, no bruits noted. No distention or masses noted. Liver, spleen and kidneys non palpable.  Neurological: Alert not oriented. Coordination normal. +DTRs bilaterally. Psychiatric: Mood depressed and affect flat. Behavior is normal. Judgment and thought content abnormal.    BMET    Component Value Date/Time   NA 140 12/22/2013 1503   K 4.6 12/22/2013 1503   CL 101 12/22/2013 1503   CO2 27 12/22/2013 1503   GLUCOSE 129* 12/22/2013 1503   BUN 26* 12/22/2013 1503   CREATININE 0.88 12/22/2013 1503   CALCIUM 9.6 12/22/2013 1503   GFRNONAA 57* 12/22/2013 1503   GFRAA 66* 12/22/2013 1503    Lipid Panel     Component Value Date/Time   CHOL 155 12/20/2013 0856   TRIG 70.0 12/20/2013 0856   HDL 55.00 12/20/2013 0856   CHOLHDL 3 12/20/2013 0856   VLDL 14.0 12/20/2013 0856   LDLCALC 86 12/20/2013 0856    CBC    Component Value Date/Time   WBC 9.1 12/22/2013 1503   RBC 4.01 12/22/2013 1503    HGB 11.8* 12/22/2013 1503   HCT 36.7 12/22/2013 1503   PLT 286 12/22/2013 1503   MCV 91.5 12/22/2013 1503   MCH 29.4 12/22/2013 1503   MCHC 32.2 12/22/2013 1503   RDW 13.9 12/22/2013 1503   LYMPHSABS 1.7 12/22/2013 1503   MONOABS 1.0 12/22/2013 1503   EOSABS 0.2 12/22/2013 1503   BASOSABS 0.0 12/22/2013 1503    Hgb A1C Lab Results  Component Value Date   HGBA1C 6.7* 12/20/2013  Assessment & Plan:   Health Maintenance:  All HM UTD No longer screening for mammogram, pap smear or bone density She does not need any refills today  RTC in 3 months for f/u appt with Dr. Glori Bickers

## 2014-04-02 NOTE — Progress Notes (Signed)
Pre visit review using our clinic review tool, if applicable. No additional management support is needed unless otherwise documented below in the visit note. 

## 2014-05-07 ENCOUNTER — Telehealth: Payer: Self-pay

## 2014-05-07 NOTE — Telephone Encounter (Signed)
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 Please advise.

## 2014-05-07 NOTE — Telephone Encounter (Signed)
Please send those-thanks

## 2014-05-08 NOTE — Telephone Encounter (Signed)
Med list and labs faxed to [facility]

## 2014-06-03 ENCOUNTER — Ambulatory Visit (INDEPENDENT_AMBULATORY_CARE_PROVIDER_SITE_OTHER): Payer: Medicare Other | Admitting: Neurology

## 2014-06-03 ENCOUNTER — Encounter (INDEPENDENT_AMBULATORY_CARE_PROVIDER_SITE_OTHER): Payer: Self-pay

## 2014-06-03 ENCOUNTER — Encounter: Payer: Self-pay | Admitting: Neurology

## 2014-06-03 VITALS — BP 131/68 | HR 81 | Temp 98.2°F | Ht 61.0 in

## 2014-06-03 DIAGNOSIS — N390 Urinary tract infection, site not specified: Secondary | ICD-10-CM

## 2014-06-03 DIAGNOSIS — F0390 Unspecified dementia without behavioral disturbance: Secondary | ICD-10-CM

## 2014-06-03 MED ORDER — DONEPEZIL HCL 5 MG PO TABS: 5.0000 mg | ORAL_TABLET | Freq: Every day | ORAL | Status: DC

## 2014-06-03 MED ORDER — MEMANTINE HCL ER 28 MG PO CP24: 28.0000 mg | ORAL_CAPSULE | Freq: Every day | ORAL | Status: DC

## 2014-06-03 NOTE — Patient Instructions (Signed)
Let's continue with the Namenda XR 28 mg and generic Aricept 5 mg daily. I will see you back in 6 months.

## 2014-06-03 NOTE — Progress Notes (Signed)
Subjective:    Patient ID: [name] is a 80 y.o. female.  HPI    Interim history:   [name] is a very pleasant 80 year old right-handed woman who presents for followup consultation of her advanced memory loss without behavioral changes. She is accompanied by her sister and niece again today. She is on Namenda twice daily. I last saw her on 11/28/2013, at which time they reported that 5 out of a total 8 total siblings have/had AD, some with behavioral issues. I kept her on Namenda XR 20 mg daily and Aricept 5 mg daily. She was not able to tolerate Aricept 10 mg because of syncope and history of bradycardia.   Today, they report she had a UTI in 3/15 and in 1/15 she was taken to the ER via EMS d/t AMS and she was found to have low blood sugar (56) and UA positive for UTI. I reviewed the ER visit records from 12/22/2013. She was treated with IVF including dextrose and given Ceftin. She lost her 63 yo brother in December and he had dementia.   She previously saw Dr. Erling Cruz and was last seen by him on 09/21/2012 at which time he felt that she had improved on Namenda. He decreased her Aricept because of a history of syncope.  She lives with her two sisters and was first seen by Dr. Erling Cruz in 2009 with memory loss beginning in 2008 or before. She has short-term episodic memory loss beginning with conversations and in taking her medications She also has procedural memory loss, not knowing how to use the TV remote. 2 other sisters and her brother have dementia. Initial MMSE was 27/30, CDT 4/4, then on 05/06/2009 MMSE 28/30, CDT 4/4, AFT 8. On 09/23/2009 = MMSE 28/30, CDT 4/4. On 10/14/10 = MMSE 26/30, CDT 4/4, AFT 12. Examination was remarkable for severe kyphosis and peripheral neuropathy. Workup for treatable causes of memory loss was negative including MRI study of the brain, TSH, B12, sedimentation rate, folate, and CBC. She is sleeping well. She no longer dresses herself, or takes care of her toilet  needs. She feeds herself. They have a CNA 24/7. She has been accepted for long-term care insurance benefits. She denies depression, and there is no report of halluciation, delusions, outbursts or agitation. She does not exercise. She reads and enjoys television but is reading less than she used to. She does not cook or clean. She makes her bed. She attends church women activities and senior citizens group activities and a "friend's and Therapist, art. She has bladder incontinence but no bowel incontinence. She uses depends. She does not fall, but her gait is very unsteady. Falls assessment tool score is 17. She does not have bizarre dreams. On 10/19/11 = MMSE 26/30, CDT 4/4, AFT 8, Geriatric depression scale was 2/15.  04/12/2012 = MMSE 15/30, CDT 3/4, AFT 6. The patient passed out on 8/21/213 at the breakfast table without tonic-clonic activity. She was seen at [facility] and thought to be dehydrated with elevated WBC and urinary tract infection. She passed out again on 09/16/2012 at church in the sitting position without tonic-clonic activity or urinary incontinence.She had no warning that she was going to pass out with either incident. On 09/21/2012 = MMSE 22/30, CDT 4/4, AFT 7.   Her Past Medical History Is Significant For: Past Medical History  Diagnosis Date  . Depression     type 2  . Hyperlipidemia   . Osteopenia   . Memory loss, short  term 3/09    mild cognitive impairment  . Abnormal CT of the chest 10/08    5 mm nodule- needs f/u CT in 1 year  . Squamous cell carcinoma of leg   . DM type 2 (diabetes mellitus, type 2)   . Dysrhythmia     "irregular at times"  . GERD (gastroesophageal reflux disease)   . Alzheimer disease     "severe" (08/08/2012)    Her Past Surgical History Is Significant For: Past Surgical History  Procedure Laterality Date  . Total hip arthroplasty  ? first OR; 02/2005    right X 2  . Ischemic colitis  10/98    colonoscopy  . Squamous cell carcinoma  excision  10/2001    on leg  . Pelvic fracture surgery  07/2002    after a fall  . Cataract extraction, bilateral  2010  . Dexa  11/01    osteopenia  . Vaginal hysterectomy  1972  . Bladder suspension  02/2001    pelvic vaginal sling for incontinence    Her Family History Is Significant For: Family History  Problem Relation Age of Onset  . Stroke Mother   . Dementia Mother   . Stroke Father   . Breast cancer Sister   . Prostate cancer Brother   . Breast cancer Sister   . Memory loss Sister   . Breast cancer Other     neice  . Alzheimer's disease Sister     older sister  . Memory loss Sister     mild    Her Social History Is Significant For: History   Social History  . Marital Status: Single    Spouse Name: N/A    Number of Children: 0  . Years of Education: N/A   Occupational History  . Retired Education officer, museum    Social History Main Topics  . Smoking status: Never Smoker   . Smokeless tobacco: Never Used  . Alcohol Use: No  . Drug Use: No  . Sexual Activity: None   Other Topics Concern  . None   Social History Narrative   Lives with her sister who helps care for her (and another sister).   Involved in church activities    Her Allergies Are:  Allergies  Allergen Reactions  . Alendronate Sodium     REACTION: GI; "don't really remember reaction"  . Risedronate Sodium     REACTION: GI; "don't really remember reaction"  :   Her Current Medications Are:  Outpatient Encounter Prescriptions as of 06/03/2014  Medication Sig  . aspirin 81 MG tablet Take 81 mg by mouth daily.  Marland Kitchen atorvastatin (LIPITOR) 10 MG tablet TAKE ONE TABLET BY MOUTH EVERY NIGHT AT BEDTIME  . calcitonin, salmon, (MIACALCIN/FORTICAL) 200 UNIT/ACT nasal spray USE 1 SPRAY IN THE NOSE EVERY DAY *ALTERNATING NOSTRILS*  . donepezil (ARICEPT) 5 MG tablet Take 1 tablet (5 mg total) by mouth at bedtime.  Marland Kitchen glucose blood test strip Use to check blood sugar daily and as directed. Dx 250.00  .  Memantine HCl ER (NAMENDA XR) 28 MG CP24 Take 28 mg by mouth daily.  . metFORMIN (GLUCOPHAGE) 500 MG tablet TAKE 1 TABLET BY MOUTH TWICE A DAY WITH MEALS  . pantoprazole (PROTONIX) 40 MG tablet TAKE ONE TABLET BY MOUTH EVERY MORNING  . Calcium Carbonate-Vit D-Min (CALTRATE 600+D PLUS) 600-400 MG-UNIT per tablet Take 1 tablet by mouth 2 (two) times daily.    :  Review of Systems:  Out of a  complete 14 point review of systems, all are reviewed and negative with the exception of these symptoms as listed below:  Review of Systems  Constitutional: Positive for fatigue.  HENT: Positive for rhinorrhea and trouble swallowing.   Eyes: Negative.   Respiratory: Negative.   Cardiovascular: Negative.   Gastrointestinal: Negative.   Endocrine: Positive for cold intolerance.  Genitourinary: Positive for enuresis.  Musculoskeletal: Positive for back pain and gait problem.  Skin: Negative.   Allergic/Immunologic: Negative.   Neurological: Positive for weakness.       Memory loss  Hematological: Negative.   Psychiatric/Behavioral: Positive for sleep disturbance (e.d.s., snoring) and decreased concentration.    Objective:  Neurologic Exam  Physical Exam Physical Examination:   Filed Vitals:   06/03/14 1606  BP: 131/68  Pulse: 81  Temp: 98.2 F (36.8 C)   General Examination: The patient is a very pleasant 80 y.o. female in no acute distress. She is in a WC. She is minimally verbal.   HEENT: Normocephalic, atraumatic, pupils are equal, round and reactive to light and accommodation. Extraocular tracking is fair without nystagmus noted. Hearing is grossly intact. Face is symmetric with normal facial animation and normal facial sensation. Speech is clear with no dysarthria noted. There is mild hypophonia. There is no lip, neck or jaw tremor. Neck is supple with full range of motion. Oropharynx exam reveals normal findings. No significant airway crowding is noted. Mallampati is class II. Tongue  protrudes centrally and palate elevates symmetrically.    Chest: is clear to auscultation without wheezing, rhonchi or crackles noted.  Heart: sounds are for the most part regular with a split second heart sound and extra heartbeats noted with pauses.   Abdomen: is soft, non-tender and non-distended with normal bowel sounds appreciated on auscultation.  Extremities: There is no pitting edema in the distal lower extremities bilaterally. Pedal pulses are intact.  Skin: is warm and dry with no trophic changes noted.  Musculoskeletal: exam reveals no obvious joint deformities, tenderness or joint swelling or erythema.  Neurologically:  Mental status: The patient is awake, but does not pay much attention. She is calm and cooperative with the exam. She is oriented to self only. Her memory, attention, language and knowledge are significantly impaired. There is slowness in thinking. Speech is scarce and low volume, but clear. She speaks in 1-3 word sentences.  06/03/2014: Her MMSE is 5/30, clock drawing is 0/4, animal fluency is 2/minute.  Affect is normal. Cranial nerves are as described above under HEENT exam. In addition, shoulder shrug is normal with equal shoulder height noted. Motor exam: Normal bulk, strength and tone is noted for age. There is no drift, tremor or rebound. Romberg is negative. Reflexes are 2+ throughout. Fine motor skills are mildly impaired.   Cerebellar testing shows no dysmetria or intention tremor. There is no truncal or gait ataxia.  Sensory exam is intact to light touch throughout.   Gait, station and balance: I did not have her stand or walk for me today and she did not bring her walker.  Assessment and Plan:   In summary, NINNIE FEIN is a very pleasant 80 year old female with a history of advanced Alzheimers disease without behavioral disturbance. She has continually progressed. She has a strong family history of Alzheimer's disease. She has recently had 2 bouts of  urinary tract infection. I've encouraged her family to try to make her drink more water. She is on Namenda long-acting and Aricept 5 mg daily. While we do  not have much in the way of evidence that the medication has been helpful, we also cannot exclude that she would've gotten much worse much faster without the medications. Therefore, we mutually decided to keep her on Aricept 5 mg daily and Namenda XR 28 mg daily. I answered all their questions today and would like to see her back in 6 months, sooner if the need arises and encouraged them to call with any interim questions, concerns, problems or updates and refill requests.

## 2014-06-07 ENCOUNTER — Telehealth: Payer: Self-pay | Admitting: Family Medicine

## 2014-06-07 DIAGNOSIS — E119 Type 2 diabetes mellitus without complications: Secondary | ICD-10-CM

## 2014-06-07 DIAGNOSIS — E785 Hyperlipidemia, unspecified: Secondary | ICD-10-CM

## 2014-06-07 DIAGNOSIS — I10 Essential (primary) hypertension: Secondary | ICD-10-CM

## 2014-06-07 DIAGNOSIS — M810 Age-related osteoporosis without current pathological fracture: Secondary | ICD-10-CM

## 2014-06-07 NOTE — Telephone Encounter (Signed)
Message copied by Abner Greenspan on Sat Jun 07, 2014  4:37 PM ------      Message from: [name]      Created: Tue Jun 03, 2014  4:38 PM      Regarding: lab orders       Patient is scheduled for CPX labs, please order future labs, Thanks , [name]       ------

## 2014-06-18 ENCOUNTER — Other Ambulatory Visit (INDEPENDENT_AMBULATORY_CARE_PROVIDER_SITE_OTHER): Payer: Medicare Other

## 2014-06-18 DIAGNOSIS — E785 Hyperlipidemia, unspecified: Secondary | ICD-10-CM

## 2014-06-18 DIAGNOSIS — M810 Age-related osteoporosis without current pathological fracture: Secondary | ICD-10-CM

## 2014-06-18 DIAGNOSIS — I10 Essential (primary) hypertension: Secondary | ICD-10-CM

## 2014-06-18 DIAGNOSIS — E119 Type 2 diabetes mellitus without complications: Secondary | ICD-10-CM

## 2014-06-18 LAB — LIPID PANEL
CHOLESTEROL: 145 mg/dL (ref 0–200)
HDL: 63.3 mg/dL (ref >39.00)
LDL Cholesterol: 68 mg/dL (ref 0–99)
NonHDL: 81.7
TRIGLYCERIDES: 67 mg/dL (ref 0.0–149.0)
Total CHOL/HDL Ratio: 2
VLDL: 13.4 mg/dL (ref 0.0–40.0)

## 2014-06-18 LAB — COMPREHENSIVE METABOLIC PANEL
ALT: 11 U/L (ref 0–35)
AST: 22 U/L (ref 0–37)
Albumin: 3.7 g/dL (ref 3.5–5.2)
Alkaline Phosphatase: 67 U/L (ref 39–117)
BUN: 19 mg/dL (ref 6–23)
CALCIUM: 9.3 mg/dL (ref 8.4–10.5)
CO2: 27 mEq/L (ref 19–32)
CREATININE: 0.9 mg/dL (ref 0.4–1.2)
Chloride: 99 mEq/L (ref 96–112)
GFR: 60.98 mL/min (ref >60.00)
Glucose, Bld: 104 mg/dL — ABNORMAL HIGH (ref 70–99)
Potassium: 4.5 mEq/L (ref 3.5–5.1)
Sodium: 134 mEq/L — ABNORMAL LOW (ref 135–145)
Total Bilirubin: 0.7 mg/dL (ref 0.2–1.2)
Total Protein: 6.9 g/dL (ref 6.0–8.3)

## 2014-06-18 LAB — CBC WITH DIFFERENTIAL/PLATELET
BASOS PCT: 0.3 % (ref 0.0–3.0)
Basophils Absolute: 0 10*3/uL (ref 0.0–0.1)
EOS ABS: 0.1 10*3/uL (ref 0.0–0.7)
Eosinophils Relative: 1 % (ref 0.0–5.0)
HEMATOCRIT: 37.4 % (ref 36.0–46.0)
HEMOGLOBIN: 12.2 g/dL (ref 12.0–15.0)
LYMPHS ABS: 1.3 10*3/uL (ref 0.7–4.0)
Lymphocytes Relative: 14 % (ref 12.0–46.0)
MCHC: 32.7 g/dL (ref 30.0–36.0)
MCV: 88.8 fl (ref 78.0–100.0)
MONO ABS: 0.8 10*3/uL (ref 0.1–1.0)
Monocytes Relative: 8.6 % (ref 3.0–12.0)
NEUTROS ABS: 7 10*3/uL (ref 1.4–7.7)
Neutrophils Relative %: 76.1 % (ref 43.0–77.0)
Platelets: 403 10*3/uL — ABNORMAL HIGH (ref 150.0–400.0)
RBC: 4.21 Mil/uL (ref 3.87–5.11)
RDW: 14 % (ref 11.5–15.5)
WBC: 9.2 10*3/uL (ref 4.0–10.5)

## 2014-06-18 LAB — TSH: TSH: 1.44 u[IU]/mL (ref 0.35–4.50)

## 2014-06-18 LAB — VITAMIN D 25 HYDROXY (VIT D DEFICIENCY, FRACTURES): VITD: 42.45 ng/mL

## 2014-06-18 LAB — HEMOGLOBIN A1C: Hgb A1c MFr Bld: 6.8 % — ABNORMAL HIGH (ref 4.6–6.5)

## 2014-06-24 ENCOUNTER — Ambulatory Visit: Payer: Medicare Other

## 2014-06-24 ENCOUNTER — Encounter: Payer: Self-pay | Admitting: Family Medicine

## 2014-06-24 ENCOUNTER — Ambulatory Visit (INDEPENDENT_AMBULATORY_CARE_PROVIDER_SITE_OTHER): Payer: Medicare Other | Admitting: Family Medicine

## 2014-06-24 VITALS — BP 112/62 | HR 87 | Temp 97.3°F | Ht 61.0 in | Wt 121.2 lb

## 2014-06-24 DIAGNOSIS — Z0000 Encounter for general adult medical examination without abnormal findings: Secondary | ICD-10-CM | POA: Insufficient documentation

## 2014-06-24 DIAGNOSIS — E785 Hyperlipidemia, unspecified: Secondary | ICD-10-CM

## 2014-06-24 DIAGNOSIS — E119 Type 2 diabetes mellitus without complications: Secondary | ICD-10-CM

## 2014-06-24 DIAGNOSIS — I10 Essential (primary) hypertension: Secondary | ICD-10-CM

## 2014-06-24 DIAGNOSIS — F0390 Unspecified dementia without behavioral disturbance: Secondary | ICD-10-CM

## 2014-06-24 DIAGNOSIS — Z23 Encounter for immunization: Secondary | ICD-10-CM

## 2014-06-24 MED ORDER — METFORMIN HCL 500 MG PO TABS: ORAL_TABLET | ORAL | Status: DC

## 2014-06-24 MED ORDER — CALCITONIN (SALMON) 200 UNIT/ACT NA SOLN
NASAL | Status: DC
Start: 2014-06-24 — End: 2015-06-04

## 2014-06-24 MED ORDER — ATORVASTATIN CALCIUM 10 MG PO TABS: ORAL_TABLET | ORAL | Status: DC

## 2014-06-24 MED ORDER — PANTOPRAZOLE SODIUM 40 MG PO TBEC: DELAYED_RELEASE_TABLET | ORAL | Status: DC

## 2014-06-24 NOTE — Patient Instructions (Signed)
prevnar vaccine today  Follow up in 6 months with labs prior  Continue watching closely for falls and follow up with neurology as planned  Diabetes is stable  I will hold off on her mammogram or bone density test due to age

## 2014-06-24 NOTE — Assessment & Plan Note (Signed)
On namenda and low dose aricept-followed by neurology Gradually worsening  Has very good home care

## 2014-06-24 NOTE — Progress Notes (Signed)
Subjective:    Patient ID: [name], female    DOB: April 07, 1922, 80 y.o.   MRN: [id number]  HPI I have personally reviewed the Medicare Annual Wellness questionnaire and have noted 1. The patient's medical and social history 2. Their use of alcohol, tobacco or illicit drugs 3. Their current medications and supplements 4. The patient's functional ability including ADL's, fall risks, home safety risks and hearing or visual             impairment. 5. Diet and physical activities 6. Evidence for depression or mood disorders  The patients weight, height, BMI have been recorded in the chart and visual acuity is per eye clinic.  I have made referrals, counseling and provided education to the patient based review of the above and I have provided the pt with a written personalized care plan for preventive services.  Wt is stable with bmi of 22.9  More problems with general leg weakness / not all the time  Appetite is fantastic (takes a long time to eat meals)  Wt is stable bmi is 22.9 C/o a lot of back pain  No falls at all - is very closely supervised  Sleeps a lot -night and day   See scanned forms.  Routine anticipatory guidance given to patient.  See health maintenance. Colon cancer screening  - declines due to age  Breast cancer screening- has not had for a while - not steady enough , declines due to age  Self breast exam-no change  Flu vaccine had 9/14 Tetanus vaccine 7/10 Pneumovax 10/08 - will do the prevnar vaccine today  Zoster vaccine 8/14   Advance directive - has this drawn up already Cognitive function addressed- see scanned forms- and if abnormal then additional documentation follows. - her alzheimers dementia is gradually worsening - sees neuro soon for that -on 2 medications (but still a good quality of life)  PMH and SH reviewed  Meds, vitals, and allergies reviewed.   ROS: See HPI.  Otherwise negative.     bp is stable today  No cp or palpitations or  headaches or edema  No side effects to medicines  BP Readings from Last 3 Encounters:  06/24/14 112/62  06/03/14 131/68  04/02/14 120/76     DM2 Lab Results  Component Value Date   HGBA1C 6.8* 06/18/2014   opthy 1/15 no retinopathy  Overall stable     dexa 2012- declines due to age and immobility  D level is 42 (was not tolerating calcium and could not swallow it)  Falls -none  fx -none  Takes miacalcin ns  Patient Active Problem List   Diagnosis Date Noted  . Encounter for Medicare annual wellness exam 06/24/2014  . Venous insufficiency 08/17/2012  . Syncope 08/08/2012  . Dementia 08/08/2012  . Abnormal EKG 08/08/2012  . Essential hypertension, benign 12/22/2009  . OSTEOPOROSIS NOS 09/24/2007  . GERD 07/16/2007  . DIABETES MELLITUS, TYPE II 07/13/2007  . HYPERLIPIDEMIA 07/13/2007  . OVERACTIVE BLADDER 07/13/2007  . DEGENERATIVE JOINT DISEASE 07/13/2007  . INCONTINENCE, URGE 07/13/2007   Past Medical History  Diagnosis Date  . Depression     type 2  . Hyperlipidemia   . Osteopenia   . Memory loss, short term 3/09    mild cognitive impairment  . Abnormal CT of the chest 10/08    5 mm nodule- needs f/u CT in 1 year  . Squamous cell carcinoma of leg   . DM type 2 (diabetes mellitus, type  2)   . Dysrhythmia     "irregular at times"  . GERD (gastroesophageal reflux disease)   . Alzheimer disease     "severe" (08/08/2012)   Past Surgical History  Procedure Laterality Date  . Total hip arthroplasty  ? first OR; 02/2005    right X 2  . Ischemic colitis  10/98    colonoscopy  . Squamous cell carcinoma excision  10/2001    on leg  . Pelvic fracture surgery  07/2002    after a fall  . Cataract extraction, bilateral  2010  . Dexa  11/01    osteopenia  . Vaginal hysterectomy  1972  . Bladder suspension  02/2001    pelvic vaginal sling for incontinence   History  Substance Use Topics  . Smoking status: Never Smoker   . Smokeless tobacco: Never Used  . Alcohol  Use: No   Family History  Problem Relation Age of Onset  . Stroke Mother   . Dementia Mother   . Stroke Father   . Breast cancer Sister   . Prostate cancer Brother   . Breast cancer Sister   . Memory loss Sister   . Breast cancer Other     neice  . Alzheimer's disease Sister     older sister  . Memory loss Sister     mild   Allergies  Allergen Reactions  . Alendronate Sodium     REACTION: GI; "don't really remember reaction"  . Risedronate Sodium     REACTION: GI; "don't really remember reaction"   Current Outpatient Prescriptions on File Prior to Visit  Medication Sig Dispense Refill  . aspirin 81 MG tablet Take 81 mg by mouth daily.      Marland Kitchen donepezil (ARICEPT) 5 MG tablet Take 1 tablet (5 mg total) by mouth at bedtime.  90 tablet  3  . glucose blood test strip Use to check blood sugar daily and as directed. Dx 250.00  100 each  6  . Memantine HCl ER (NAMENDA XR) 28 MG CP24 Take 28 mg by mouth daily.  90 capsule  3   No current facility-administered medications on file prior to visit.    Review of Systems Review of Systems  Constitutional: Negative for fever, appetite change,  and unexpected weight change. pos for sleepiness  Eyes: Negative for pain and visual disturbance.  Respiratory: Negative for cough and shortness of breath.   Cardiovascular: Negative for cp or palpitations    Gastrointestinal: Negative for nausea, diarrhea and constipation.  Genitourinary: Negative for urgency and frequency.  Skin: Negative for pallor or rash   Neurological: Negative for  light-headedness, numbness and headaches. pos for shuffling gait, neg for tremor pos for dementia  MSK pos for chronic back pain  Hematological: Negative for adenopathy. Does not bruise/bleed easily.  Psychiatric/Behavioral: Negative for dysphoric mood. The patient is not nervous/anxious.         Objective:   Physical Exam  Constitutional: She appears well-developed and well-nourished. No distress.  Pt  sleeps for some of visit- but awakens alert and can answer some questions  HENT:  Head: Normocephalic and atraumatic.  Right Ear: External ear normal.  Left Ear: External ear normal.  Mouth/Throat: Oropharynx is clear and moist.  Eyes: Conjunctivae and EOM are normal. Pupils are equal, round, and reactive to light. No scleral icterus.  Neck: Normal range of motion. Neck supple. No JVD present. Carotid bruit is not present. No thyromegaly present.  Cardiovascular: Normal rate, regular rhythm,  normal heart sounds and intact distal pulses.  Exam reveals no gallop.   Pulmonary/Chest: Effort normal and breath sounds normal. No respiratory distress. She has no wheezes. She exhibits no tenderness.  Abdominal: Soft. Bowel sounds are normal. She exhibits no distension, no abdominal bruit and no mass. There is no tenderness.  Genitourinary: No breast swelling, tenderness, discharge or bleeding.  Breast exam: No mass, nodules, thickening, tenderness, bulging, retraction, inflamation, nipple discharge or skin changes noted.  No axillary or clavicular LA.      Musculoskeletal: Normal range of motion. She exhibits no edema and no tenderness.  Poor rom of spine   Lymphadenopathy:    She has no cervical adenopathy.  Neurological: She is alert. She has normal reflexes. No cranial nerve deficit. She exhibits normal muscle tone. Coordination normal.  Shuffling gait with short steps Uses walker   Skin: Skin is warm and dry. No rash noted. No erythema. No pallor.  Psychiatric: She has a normal mood and affect. Cognition and memory are impaired. She exhibits abnormal recent memory.  Pt sleeps on and off Pleasant and cooperative  Dementia is evident          Assessment & Plan:   Problem List Items Addressed This Visit     Cardiovascular and Mediastinum   Essential hypertension, benign - Primary      bp in fair control at this time  BP Readings from Last 1 Encounters:  06/24/14 112/62   No changes  needed Disc lifstyle change with low sodium diet and exercise  Labs reviewed     Relevant Medications      atorvastatin (LIPITOR) tablet     Endocrine   DIABETES MELLITUS, TYPE II      Lab Results  Component Value Date   HGBA1C 6.8* 06/18/2014   Overall stable  opthy exam utd  Appetite still good despite dementia    Relevant Medications      atorvastatin (LIPITOR) tablet      metFORMIN (GLUCOPHAGE) tablet     Nervous and Auditory   Dementia     On namenda and low dose aricept-followed by neurology Gradually worsening  Has very good home care       Other   HYPERLIPIDEMIA     Disc goals for lipids and reasons to control them Rev labs with pt Rev low sat fat diet in detail     Relevant Medications      atorvastatin (LIPITOR) tablet   Encounter for Medicare annual wellness exam     Reviewed health habits including diet and exercise and skin cancer prevention Reviewed appropriate screening tests for age  Also reviewed health mt list, fam hx and immunization status , as well as social and family history   See HPI Declines colonosc/ mammogram/dexa due to age and immobility  prevnar vaccine today       Other Visit Diagnoses   Need for vaccination with 13-polyvalent pneumococcal conjugate vaccine        Relevant Orders       Pneumococcal conjugate vaccine 13-valent (Completed)

## 2014-06-24 NOTE — Assessment & Plan Note (Signed)
bp in fair control at this time  BP Readings from Last 1 Encounters:  06/24/14 112/62   No changes needed Disc lifstyle change with low sodium diet and exercise  Labs reviewed

## 2014-06-24 NOTE — Assessment & Plan Note (Signed)
Reviewed health habits including diet and exercise and skin cancer prevention Reviewed appropriate screening tests for age  Also reviewed health mt list, fam hx and immunization status , as well as social and family history   See HPI Declines colonosc/ mammogram/dexa due to age and immobility  prevnar vaccine today

## 2014-06-24 NOTE — Assessment & Plan Note (Signed)
Disc goals for lipids and reasons to control them Rev labs with pt Rev low sat fat diet in detail   

## 2014-06-24 NOTE — Assessment & Plan Note (Signed)
Lab Results  Component Value Date   HGBA1C 6.8* 06/18/2014   Overall stable  opthy exam utd  Appetite still good despite dementia

## 2014-06-24 NOTE — Assessment & Plan Note (Signed)
Intol of bisphosphenates On miacalcin No fx or falls Cannot swallow ca - eats high ca foods  D level is good at 49 Disc need for calcium/ vitamin D/ wt bearing exercise and bone density test every 2 y to monitor  (decl dexa at this time due to age/dementia and immobility) Disc safety/ fracture risk in detail

## 2014-06-25 ENCOUNTER — Telehealth: Payer: Self-pay | Admitting: Family Medicine

## 2014-06-25 NOTE — Telephone Encounter (Signed)
Relevant patient education mailed to patient.  

## 2014-07-01 ENCOUNTER — Ambulatory Visit (INDEPENDENT_AMBULATORY_CARE_PROVIDER_SITE_OTHER): Payer: Medicare Other

## 2014-07-01 DIAGNOSIS — E1140 Type 2 diabetes mellitus with diabetic neuropathy, unspecified: Secondary | ICD-10-CM

## 2014-07-01 DIAGNOSIS — B351 Tinea unguium: Secondary | ICD-10-CM

## 2014-07-01 DIAGNOSIS — E1142 Type 2 diabetes mellitus with diabetic polyneuropathy: Secondary | ICD-10-CM

## 2014-07-01 DIAGNOSIS — M79609 Pain in unspecified limb: Secondary | ICD-10-CM

## 2014-07-01 DIAGNOSIS — Q828 Other specified congenital malformations of skin: Secondary | ICD-10-CM

## 2014-07-01 DIAGNOSIS — E1149 Type 2 diabetes mellitus with other diabetic neurological complication: Secondary | ICD-10-CM

## 2014-07-01 DIAGNOSIS — M79606 Pain in leg, unspecified: Secondary | ICD-10-CM

## 2014-07-01 NOTE — Progress Notes (Signed)
   Subjective:    Patient ID: [name], female    DOB: [birth year], 80 y.o.   MRN: [id number]  HPINail debridement, diabetic pt, no other issues at this time  Review of Systems no new findings or systemic changes noted     Objective:   Physical Exam J objective findings as follows vascular status appears to be intact although diminished DP plus one over 4 PT plus one over 4 bilateral capillary refill time 3 seconds all digits bilateral varicosities noted neurologically epicritic and proprioceptive sensations on Semmes Weinstein to the forefoot and digits plantar arch symptoms hyperesthesia noted as well. Patient last A1c was 6.1. Patient's nails slightly criptotic incurvated and discolored and friable tender and touchy palpation and with ambulatory ambulation include shoes. Patient walks with the assistance of a walker. Her nails are not progress facet had previously grown. There is no open wounds ulcerations no secondary infection is mild digital contractures are noted otherwise unremarkable for structure       Assessment & Plan:  Assessment this time his diabetes with history peripheral neuropathy and mild angiopathy. Nails debrided and the presence of pain in symptomology as well as onychomycosis and friability of diabetes. Return for future palliative care every 3-4 months or as needed as nails and I can't assess 4 months to be appropriate time  Harriet Masson DPM

## 2014-07-01 NOTE — Patient Instructions (Signed)
Diabetes and Foot Care Diabetes may cause you to have problems because of poor blood supply (circulation) to your feet and legs. This may cause the skin on your feet to become thinner, break easier, and heal more slowly. Your skin may become dry, and the skin may peel and crack. You may also have nerve damage in your legs and feet causing decreased feeling in them. You may not notice minor injuries to your feet that could lead to infections or more serious problems. Taking care of your feet is one of the most important things you can do for yourself.  HOME CARE INSTRUCTIONS  Wear shoes at all times, even in the house. Do not go barefoot. Bare feet are easily injured.  Check your feet daily for blisters, cuts, and redness. If you cannot see the bottom of your feet, use a mirror or ask someone for help.  Wash your feet with warm water (do not use hot water) and mild soap. Then pat your feet and the areas between your toes until they are completely dry. Do not soak your feet as this can dry your skin.  Apply a moisturizing lotion or petroleum jelly (that does not contain alcohol and is unscented) to the skin on your feet and to dry, brittle toenails. Do not apply lotion between your toes.  Trim your toenails straight across. Do not dig under them or around the cuticle. File the edges of your nails with an emery board or nail file.  Do not cut corns or calluses or try to remove them with medicine.  Wear clean socks or stockings every day. Make sure they are not too tight. Do not wear knee-high stockings since they may decrease blood flow to your legs.  Wear shoes that fit properly and have enough cushioning. To break in new shoes, wear them for just a few hours a day. This prevents you from injuring your feet. Always look in your shoes before you put them on to be sure there are no objects inside.  Do not cross your legs. This may decrease the blood flow to your feet.  If you find a minor scrape,  cut, or break in the skin on your feet, keep it and the skin around it clean and dry. These areas may be cleansed with mild soap and water. Do not cleanse the area with peroxide, alcohol, or iodine.  When you remove an adhesive bandage, be sure not to damage the skin around it.  If you have a wound, look at it several times a day to make sure it is healing.  Do not use heating pads or hot water bottles. They may burn your skin. If you have lost feeling in your feet or legs, you may not know it is happening until it is too late.  Make sure your health care provider performs a complete foot exam at least annually or more often if you have foot problems. Report any cuts, sores, or bruises to your health care provider immediately. SEEK MEDICAL CARE IF:   You have an injury that is not healing.  You have cuts or breaks in the skin.  You have an ingrown nail.  You notice redness on your legs or feet.  You feel burning or tingling in your legs or feet.  You have pain or cramps in your legs and feet.  Your legs or feet are numb.  Your feet always feel cold. SEEK IMMEDIATE MEDICAL CARE IF:   There is increasing redness,   swelling, or pain in or around a wound.  There is a red line that goes up your leg.  Pus is coming from a wound.  You develop a fever or as directed by your health care provider.  You notice a bad smell coming from an ulcer or wound. Document Released: 12/02/2000 Document Revised: 08/07/2013 Document Reviewed: 05/14/2013 ExitCare Patient Information 2015 [employer]. This information is not intended to replace advice given to you by your health care provider. Make sure you discuss any questions you have with your health care provider.  

## 2014-07-23 ENCOUNTER — Other Ambulatory Visit: Payer: Self-pay | Admitting: Family Medicine

## 2014-07-25 ENCOUNTER — Telehealth: Payer: Self-pay

## 2014-07-25 ENCOUNTER — Encounter: Payer: Self-pay | Admitting: Family Medicine

## 2014-07-25 DIAGNOSIS — Z7409 Other reduced mobility: Secondary | ICD-10-CM | POA: Insufficient documentation

## 2014-07-25 NOTE — Telephone Encounter (Signed)
Done and in IN box 

## 2014-07-25 NOTE — Telephone Encounter (Signed)
Order faxed and Vaughan Basta notified

## 2014-07-25 NOTE — Telephone Encounter (Signed)
Received fax from [facility] advising [place] of what they need on the order. Also not sure which office note to use please advise Dr. Glori Bickers, letter and your original order placed in your inbox

## 2014-07-25 NOTE — Telephone Encounter (Signed)
[name] said [facility] told her last office visit could be amended; but [name] said the transport chair was not discussed at the July visit even though pt needed it in July as well. [name] said to cb and let her know if visit could be amended or will pt need to schedule appt.

## 2014-07-25 NOTE — Telephone Encounter (Addendum)
[name] with [facility] left v/m; transport chair rx requires NPI # on script with doctors initials and redated and also with office visit notes with documentation for need for transport chair. Angela request info faxed to [phone] [facility]:Angela or call [name] at [phone].

## 2014-07-25 NOTE — Telephone Encounter (Signed)
Sharyn Lull (sister) advised.  Appointment scheduled.

## 2014-07-25 NOTE — Telephone Encounter (Addendum)
Pt left note requesting written prescription for transport chair.ICD 9 code is required on rx. Pt would like to pick up rx today and she can get chair today. [facility] # [phone] if needed. Spoke with Vaughan Basta to verify the name of the chair needed; Vaughan Basta said it is a transport chair (has smaller wheels than regular w/c and in pts home large wheelchair will not go thru doors.) pt can only walk very short distance. Vaughan Basta said could fax rx to [facility] at [phone]; Vaughan Basta request cb when done so she can pick chair up today.

## 2014-07-25 NOTE — Telephone Encounter (Signed)
Need to schedule a visit - I actually have to do an exam to assess her mobility status/ watch her walk etc- and that has to be the primary reason for the exam

## 2014-07-28 ENCOUNTER — Ambulatory Visit: Payer: Medicare Other | Admitting: Family Medicine

## 2014-07-29 ENCOUNTER — Encounter: Payer: Self-pay | Admitting: Family Medicine

## 2014-07-29 ENCOUNTER — Ambulatory Visit (INDEPENDENT_AMBULATORY_CARE_PROVIDER_SITE_OTHER): Payer: Medicare Other | Admitting: Family Medicine

## 2014-07-29 VITALS — BP 134/64 | HR 84 | Temp 97.9°F | Ht 61.0 in | Wt 121.0 lb

## 2014-07-29 DIAGNOSIS — R269 Unspecified abnormalities of gait and mobility: Secondary | ICD-10-CM

## 2014-07-29 DIAGNOSIS — Z7409 Other reduced mobility: Secondary | ICD-10-CM

## 2014-07-29 DIAGNOSIS — M1990 Unspecified osteoarthritis, unspecified site: Secondary | ICD-10-CM

## 2014-07-29 DIAGNOSIS — R69 Illness, unspecified: Secondary | ICD-10-CM

## 2014-07-29 NOTE — Patient Instructions (Signed)
I will send note documenting the reasons for need for transport chair -=to [facility] (where the px was sent )

## 2014-07-29 NOTE — Assessment & Plan Note (Signed)
See above-due to dementia/ generalized weakness (not PT candidate)/chronic pain from deg joint dz/ poor balance /fall risk/and gait disorder  Can use walker assisted for short periods only (fatigue and pain )-no more than 10 feet and usually less Usually wheelchair bound This will not fit through doorways at her residence - requires 19 inch light weight transport chair with small wheels to fit through 22 inch door openings in her home (kitchen/bathroom)

## 2014-07-29 NOTE — Progress Notes (Signed)
Subjective:    Patient ID: [name], female    DOB: 1922/11/20, 80 y.o.   MRN: [id number]  HPI Here for mobility assessment for a transport chair   Trying to get a transport chair  Needs one that is lightweight - 19 inches wide with small wheels to be able to get into the bathroom/ bedroom/ kitchen (they are 22 inches wide accounting for the thickness of the door when it is open)  Usually gets around home with walker and also her caregiver holding on to her She can go 10 feet (farthest) with assistance and walker (her caregiver has to hold on to her) Gilford Rile is not narrow enough to get through doors  Her wheelchair (they use when her distance is too far to travel) is also too wide to get through doors of her home    Also shuffles her feet (gait disorder)- with dementia  Generalized loss of balance and generalized weakness (she is not a candidate for PT due to her dementia anyway)  Pain limitation : degenerative joint disease in her legs (knees and hips) and also back  She tends to stoop forward   Patient Active Problem List   Diagnosis Date Noted  . Gait disorder 07/29/2014  . Mobility impaired 07/25/2014  . Encounter for Medicare annual wellness exam 06/24/2014  . Venous insufficiency 08/17/2012  . Syncope 08/08/2012  . Dementia 08/08/2012  . Abnormal EKG 08/08/2012  . Essential hypertension, benign 12/22/2009  . OSTEOPOROSIS NOS 09/24/2007  . GERD 07/16/2007  . DIABETES MELLITUS, TYPE II 07/13/2007  . HYPERLIPIDEMIA 07/13/2007  . OVERACTIVE BLADDER 07/13/2007  . DEGENERATIVE JOINT DISEASE 07/13/2007  . INCONTINENCE, URGE 07/13/2007   Past Medical History  Diagnosis Date  . Depression     type 2  . Hyperlipidemia   . Osteopenia   . Memory loss, short term 3/09    mild cognitive impairment  . Abnormal CT of the chest 10/08    5 mm nodule- needs f/u CT in 1 year  . Squamous cell carcinoma of leg   . DM type 2 (diabetes mellitus, type 2)   . Dysrhythmia      "irregular at times"  . GERD (gastroesophageal reflux disease)   . Alzheimer disease     "severe" (08/08/2012)   Past Surgical History  Procedure Laterality Date  . Total hip arthroplasty  ? first OR; 02/2005    right X 2  . Ischemic colitis  10/98    colonoscopy  . Squamous cell carcinoma excision  10/2001    on leg  . Pelvic fracture surgery  07/2002    after a fall  . Cataract extraction, bilateral  2010  . Dexa  11/01    osteopenia  . Vaginal hysterectomy  1972  . Bladder suspension  02/2001    pelvic vaginal sling for incontinence   History  Substance Use Topics  . Smoking status: Never Smoker   . Smokeless tobacco: Never Used  . Alcohol Use: No   Family History  Problem Relation Age of Onset  . Stroke Mother   . Dementia Mother   . Stroke Father   . Breast cancer Sister   . Prostate cancer Brother   . Breast cancer Sister   . Memory loss Sister   . Breast cancer Other     neice  . Alzheimer's disease Sister     older sister  . Memory loss Sister     mild   Allergies  Allergen Reactions  .  Alendronate Sodium     REACTION: GI; "don't really remember reaction"  . Risedronate Sodium     REACTION: GI; "don't really remember reaction"   Current Outpatient Prescriptions on File Prior to Visit  Medication Sig Dispense Refill  . aspirin 81 MG tablet Take 81 mg by mouth daily.      Marland Kitchen atorvastatin (LIPITOR) 10 MG tablet TAKE ONE TABLET BY MOUTH EVERY NIGHT AT BEDTIME  30 tablet  11  . calcitonin, salmon, (MIACALCIN/FORTICAL) 200 UNIT/ACT nasal spray USE 1 SPRAY IN THE NOSE EVERY DAY *ALTERNATING NOSTRILS*  3.7 mL  11  . donepezil (ARICEPT) 5 MG tablet Take 1 tablet (5 mg total) by mouth at bedtime.  90 tablet  3  . glucose blood test strip Use to check blood sugar daily and as directed. Dx 250.00  100 each  6  . Memantine HCl ER (NAMENDA XR) 28 MG CP24 Take 28 mg by mouth daily.  90 capsule  3  . metFORMIN (GLUCOPHAGE) 500 MG tablet TAKE 1 TABLET BY MOUTH TWICE A  DAY WITH MEALS  60 tablet  11  . pantoprazole (PROTONIX) 40 MG tablet TAKE ONE TABLET BY MOUTH EVERY MORNING  30 tablet  11   No current facility-administered medications on file prior to visit.    Review of Systems Review of Systems  Constitutional: Negative for fever, appetite change, fatigue and unexpected weight change.  Eyes: Negative for pain and visual disturbance.  Respiratory: Negative for cough and shortness of breath.   Cardiovascular: Negative for cp or palpitations    Gastrointestinal: Negative for nausea, diarrhea and constipation.  Genitourinary: Negative for urgency and frequency.  Skin: Negative for pallor or rash   Neurological: Negative for weakness, light-headedness, numbness and headaches. pos for poor balance and gait disorder MSK pos for knee/hip/back pain  Hematological: Negative for adenopathy. Does not bruise/bleed easily.  Psychiatric/Behavioral: Negative for dysphoric mood. The patient is not nervous/anxious.  pos for moderate dementia        Objective:   Physical Exam  Constitutional: She appears well-developed and well-nourished. No distress.  Elderly female - sleeping at intervals  HENT:  Head: Normocephalic and atraumatic.  Mouth/Throat: Oropharynx is clear and moist.  Eyes: Conjunctivae and EOM are normal. Pupils are equal, round, and reactive to light. No scleral icterus.  Neck: Normal range of motion. Neck supple. No JVD present. Carotid bruit is not present. No thyromegaly present.  Cardiovascular: Normal rate, regular rhythm and intact distal pulses.  Exam reveals no gallop.   Pulmonary/Chest: Effort normal and breath sounds normal. No respiratory distress. She has no wheezes. She has no rales.  Musculoskeletal: She exhibits tenderness. She exhibits no edema.  kyphhosis noted with poor rom TS and LS Poor rom of knees and hips  Tibial tenderness bilaterally     Lymphadenopathy:    She has no cervical adenopathy.  Neurological: She is alert.  She has normal reflexes. She displays no atrophy. No cranial nerve deficit or sensory deficit. She exhibits normal muscle tone. Coordination and gait abnormal.  Slow unsteady wide based gait with walker and support of caregiver  Can take 4-5 steps without needing to rest  Cannot stand from sitting without assistance   Poor grip strength  Unable to fully follow directions  Skin: Skin is warm and dry. No rash noted. No erythema. No pallor.  Psychiatric: Her mood appears not anxious. Her affect is blunt. Her speech is delayed. She is slowed. Cognition and memory are impaired. She does  not exhibit a depressed mood. She exhibits abnormal recent memory.  Dementia- baseline  Quiet-occ answers a question  Sleepy but arousible          Assessment & Plan:   Problem List Items Addressed This Visit     Musculoskeletal and Integument   DEGENERATIVE JOINT DISEASE     Hips/knees and spine  This limits her from walking more than 10 feet (usually less)-depending on her pain level on the day - assisted with walker  She needs a transfer chair to get through doorways in home  Wheelchair will not fit       Other   Mobility impaired - Primary     See above-due to dementia/ generalized weakness (not PT candidate)/chronic pain from deg joint dz/ poor balance /fall risk/and gait disorder  Can use walker assisted for short periods only (fatigue and pain )-no more than 10 feet and usually less Usually wheelchair bound This will not fit through doorways at her residence - requires 19 inch light weight transport chair with small wheels to fit through 22 inch door openings in her home (kitchen/bathroom)    Gait disorder     Due to age and dementia and pain issues  Wide based shuffling gait requiring both a light weight walker and support from her aide  Cannot walk unassisted without falling  Requires wheelchair/transport chair at most times

## 2014-07-29 NOTE — Progress Notes (Signed)
Pre visit review using our clinic review tool, if applicable. No additional management support is needed unless otherwise documented below in the visit note. 

## 2014-07-29 NOTE — Assessment & Plan Note (Signed)
Hips/knees and spine  This limits her from walking more than 10 feet (usually less)-depending on her pain level on the day - assisted with walker  She needs a transfer chair to get through doorways in home  Wheelchair will not fit

## 2014-07-29 NOTE — Assessment & Plan Note (Signed)
Due to age and dementia and pain issues  Wide based shuffling gait requiring both a light weight walker and support from her aide  Cannot walk unassisted without falling  Requires wheelchair/transport chair at most times

## 2014-07-30 NOTE — Telephone Encounter (Signed)
Office note and corrected order faxed back to [facility]

## 2014-09-03 ENCOUNTER — Ambulatory Visit (INDEPENDENT_AMBULATORY_CARE_PROVIDER_SITE_OTHER): Payer: Medicare Other

## 2014-09-03 DIAGNOSIS — Z23 Encounter for immunization: Secondary | ICD-10-CM

## 2014-11-04 ENCOUNTER — Ambulatory Visit: Payer: Medicare Other

## 2014-11-10 ENCOUNTER — Ambulatory Visit (INDEPENDENT_AMBULATORY_CARE_PROVIDER_SITE_OTHER): Payer: Medicare Other | Admitting: Podiatry

## 2014-11-10 DIAGNOSIS — Q828 Other specified congenital malformations of skin: Secondary | ICD-10-CM

## 2014-11-10 DIAGNOSIS — E1151 Type 2 diabetes mellitus with diabetic peripheral angiopathy without gangrene: Secondary | ICD-10-CM

## 2014-11-10 DIAGNOSIS — M79673 Pain in unspecified foot: Secondary | ICD-10-CM

## 2014-11-10 DIAGNOSIS — B351 Tinea unguium: Secondary | ICD-10-CM

## 2014-11-10 NOTE — Progress Notes (Signed)
Subjective:     Patient ID: [name], female   DOB: 12/01/1922, 80 y.o.   MRN: [id number]  HPI patient presents with elongated thickened yellow brittle toenails 1-5 with pain when pressed   Review of Systems     Objective:   Physical Exam Neurovascular status intact with thick yellow brittle nailbeds 1-5 both feet    Assessment:     Mycotic nail infection 1-5 both feet    Plan:     Debride painful nailbeds 1-5 both feet with no iatrogenic bleeding noted      

## 2014-11-27 ENCOUNTER — Telehealth: Payer: Self-pay

## 2014-11-27 NOTE — Telephone Encounter (Signed)
PLEASE NOTE: All timestamps contained within this report are represented as [place] Standard Time. CONFIDENTIALTY NOTICE: This fax transmission is intended only for the addressee. It contains information that is legally privileged, confidential or otherwise protected from use or disclosure. If you are not the intended recipient, you are strictly prohibited from reviewing, disclosing, copying using or disseminating any of this information or taking any action in reliance on or regarding this information. If you have received this fax in error, please notify us immediately by telephone so that we can arrange for its return to [place]. Phone: [phone], Toll-Free: [phone], Fax: [phone] Page: 1 of 2 Call Id: [id number] [place] Patient Name: [name] Gender: Female DOB: 09-20-1922 Age: 80 Y 3 M 4 D Return Phone Number: [phone] (Primary) Address: [address] City/State/Zip: [place] [address] Client [facility] - Client Client Site [place], [place] Contact Type Call Call Type Triage / Clinical Caller Name Lovenia Shuck, CNA Relationship To Patient Care Giver Return Phone Number [phone] (Primary) Chief Complaint UNCONSCIOUS - can't wake someone up Initial Comment Caller states, sugar is 151, hard time waking her up BP 115/71, 97.1 temp HR 87 PreDisposition InappropriateToAsk Nurse Assessment Nurse: Mallie Mussel, RN, Alveta Heimlich Date/Time (Eastern Time): 11/27/2014 11:46:44 AM Confirm and document reason for call. If symptomatic, describe symptoms. ---Caller states, sugar is 151, hard time waking her up BP 115/71, 97.1 temp HR 87 She is having a hard time waking her up and keeping her awake. She will open her eyes and then go right back to sleep. This is new and different for her. Has the patient traveled out of the  country within the last 30 days? ---Not Applicable Does the patient require triage? ---Yes Related visit to physician within the last 2 weeks? ---N/A Does the PT have any chronic conditions? (i.e. diabetes, asthma, etc.) ---Yes List chronic conditions. ---Diabetes, No heart conditions Guidelines Guideline Title Affirmed Question Affirmed Notes Nurse Date/Time (Eastern Time) Diabetes - High Blood Sugar Unconscious or difficult to awaken 151, but hard time waking her up and keeping her awake. Mallie Mussel, RN, Alveta Heimlich 11/27/2014 11:48:41 AM Disp. Time Eilene Ghazi Time) Disposition Final User 11/27/2014 11:44:49 AM Send to Urgent Queue Eather Colas 11/27/2014 11:50:15 AM Attempt made - message left Orlene Och 11/27/2014 11:55:08 AM 911 Follow Up Call Attempted Mallie Mussel RN, Alveta Heimlich Reason: [id number]- I called back x 2 and received a busy signal each time. 11/27/2014 11:55:20 AM Call Completed Mallie Mussel RN, Alveta Heimlich 11/27/2014 11:49:37 AM Call EMS 911 Now Yes Mallie Mussel, RN, Carman Ching NOTE: All timestamps contained within this report are represented as [place] Standard Time. CONFIDENTIALTY NOTICE: This fax transmission is intended only for the addressee. It contains information that is legally privileged, confidential or otherwise protected from use or disclosure. If you are not the intended recipient, you are strictly prohibited from reviewing, disclosing, copying using or disseminating any of this information or taking any action in reliance on or regarding this information. If you have received this fax in error, please notify us immediately by telephone so that we can arrange for its return to [place]. Phone: [phone], Toll-Free: [phone], Fax: [phone] Page: 2 of 2 Call Id: [id number] Caller Understands: Yes Disagree/Comply: Comply Care Advice Given Per Guideline CALL EMS 911 NOW: Immediate medical attention is needed. You need to hang up and call 911 (or an ambulance). Psychologist, forensic Discretion: [employer]  call you back in a few minutes to be sure you were able to reach them.) After Care Instructions Given Call Event Type User Date / Time Description

## 2014-11-27 NOTE — Telephone Encounter (Signed)
Noted. To pcp as fyi

## 2014-11-27 NOTE — Telephone Encounter (Signed)
Spoke with Sharyn Lull, pts sister and pt did not go to hospital; pt woke up and ate lunch ; vital signs are good and pt seems OK now.

## 2014-12-04 ENCOUNTER — Ambulatory Visit (INDEPENDENT_AMBULATORY_CARE_PROVIDER_SITE_OTHER): Payer: Medicare Other | Admitting: Neurology

## 2014-12-04 ENCOUNTER — Encounter: Payer: Self-pay | Admitting: Neurology

## 2014-12-04 VITALS — BP 131/72 | HR 92 | Temp 97.2°F

## 2014-12-04 DIAGNOSIS — F0390 Unspecified dementia without behavioral disturbance: Secondary | ICD-10-CM

## 2014-12-04 DIAGNOSIS — N390 Urinary tract infection, site not specified: Secondary | ICD-10-CM

## 2014-12-04 NOTE — Patient Instructions (Signed)
We will continue with Aricept 5 mg daily and Namenda XR 28 mg daily.  Please try to drink more water.  The memory scores are better today! Follow up in 6 months.

## 2014-12-04 NOTE — Progress Notes (Signed)
Subjective:    Patient ID: [name] is a 80 y.o. female.  HPI     Interim history:   [name] is a very pleasant 80 year old right-handed woman who presents for followup consultation of her advanced memory loss without behavioral changes. She is accompanied by her sister, niece and caretaker, [name], today. I last saw her on 06/03/2014, at which time her family reported that she had a UTI in March 2015 and in January 2015 she was taken to the ER via EMS secondary to altered mental status and she was found to have a low blood sugar as well as a urinary tract infection. Her 90 year old brother died in 12-07-2013. I suggested we continue with Aricept 5 mg and long-acting Namenda 28 mg daily.    Today, her family and her caretaker report that she has been stable. Today is a good day. She has had no recent UTI but earlier this week she had an episode of unresponsiveness and EMS was called. Her blood sugar and blood pressure were fine. She came to without any consequences and they did not take her to the hospitalist she was fine and back to baseline. There was no seizure.  I saw her on 11/28/2013, at which time they reported that 5 out of a total 8 total siblings have/had AD, some with behavioral issues. I kept her on Namenda XR 20 mg daily and Aricept 5 mg daily. She was not able to tolerate Aricept at 10 mg daily, because of syncope and history of bradycardia.   She previously saw Dr. Erling Cruz and was last seen by him on 09/21/2012 at which time he felt that she had improved on Namenda. He decreased her Aricept because of a history of syncope.   She lives with her two sisters and was first seen by Dr. Erling Cruz in 2009 with memory loss beginning in 2008 or before. She has short-term episodic memory loss beginning with conversations and in taking her medications She also has procedural memory loss, not knowing how to use the TV remote. 2 other sisters and her brother have dementia. Initial MMSE was  27/30, CDT 4/4, then on 05/06/2009 MMSE 28/30, CDT 4/4, AFT 8. On 09/23/2009 = MMSE 28/30, CDT 4/4. On 10/14/10 = MMSE 26/30, CDT 4/4, AFT 12. Examination was remarkable for severe kyphosis and peripheral neuropathy. Workup for treatable causes of memory loss was negative including MRI study of the brain, TSH, B12, sedimentation rate, folate, and CBC. She is sleeping well. She no longer dresses herself, or takes care of her toilet needs. She feeds herself. They have a CNA 24/7. She has been accepted for long-term care insurance benefits. She denies depression, and there is no report of halluciation, delusions, outbursts or agitation. She does not exercise. She reads and enjoys television but is reading less than she used to. She does not cook or clean. She makes her bed. She attends church women activities and senior citizens group activities and a "friend's and Therapist, art. She has bladder incontinence but no bowel incontinence. She uses depends. She does not fall, but her gait is very unsteady. Falls assessment tool score is 17. She does not have bizarre dreams. On 10/19/11 = MMSE 26/30, CDT 4/4, AFT 8, Geriatric depression scale was 2/15.   04/12/2012 = MMSE 15/30, CDT 3/4, AFT 6. The patient passed out on 8/21/213 at the breakfast table without tonic-clonic activity. She was seen at [facility] and thought to be dehydrated with elevated WBC and  urinary tract infection. She passed out again on 09/16/2012 at church in the sitting position without tonic-clonic activity or urinary incontinence.She had no warning that she was going to pass out with either incident. On 09/21/2012 = MMSE 22/30, CDT 4/4, AFT 7.   Her Past Medical History Is Significant For: Past Medical History  Diagnosis Date  . Depression     type 2  . Hyperlipidemia   . Osteopenia   . Memory loss, short term 3/09    mild cognitive impairment  . Abnormal CT of the chest 10/08    5 mm nodule- needs f/u CT in 1 year  . Squamous  cell carcinoma of leg   . DM type 2 (diabetes mellitus, type 2)   . Dysrhythmia     "irregular at times"  . GERD (gastroesophageal reflux disease)   . Alzheimer disease     "severe" (08/08/2012)    Her Past Surgical History Is Significant For: Past Surgical History  Procedure Laterality Date  . Total hip arthroplasty  ? first OR; 02/2005    right X 2  . Ischemic colitis  10/98    colonoscopy  . Squamous cell carcinoma excision  10/2001    on leg  . Pelvic fracture surgery  07/2002    after a fall  . Cataract extraction, bilateral  2010  . Dexa  11/01    osteopenia  . Vaginal hysterectomy  1972  . Bladder suspension  02/2001    pelvic vaginal sling for incontinence    Her Family History Is Significant For: Family History  Problem Relation Age of Onset  . Stroke Mother   . Dementia Mother   . Stroke Father   . Breast cancer Sister   . Prostate cancer Brother   . Breast cancer Sister   . Memory loss Sister   . Breast cancer Other     neice  . Alzheimer's disease Sister     older sister  . Memory loss Sister     mild   Her Social History Is Significant For: History   Social History  . Marital Status: Single    Spouse Name: N/A    Number of Children: 0  . Years of Education: N/A   Occupational History  . Retired Education officer, museum    Social History Main Topics  . Smoking status: Never Smoker   . Smokeless tobacco: Never Used  . Alcohol Use: No  . Drug Use: No  . Sexual Activity: None   Other Topics Concern  . None   Social History Narrative   Lives with her sister who helps care for her (and another sister).   Involved in church activities    Her Allergies Are:  Allergies  Allergen Reactions  . Alendronate Sodium     REACTION: GI; "don't really remember reaction"  . Risedronate Sodium     REACTION: GI; "don't really remember reaction"  :   Her Current Medications Are:  Outpatient Encounter Prescriptions as of 12/04/2014  Medication Sig  .  aspirin 81 MG tablet Take 81 mg by mouth daily.  Marland Kitchen atorvastatin (LIPITOR) 10 MG tablet TAKE ONE TABLET BY MOUTH EVERY NIGHT AT BEDTIME  . calcitonin, salmon, (MIACALCIN/FORTICAL) 200 UNIT/ACT nasal spray USE 1 SPRAY IN THE NOSE EVERY DAY *ALTERNATING NOSTRILS*  . donepezil (ARICEPT) 5 MG tablet Take 1 tablet (5 mg total) by mouth at bedtime.  Marland Kitchen glucose blood test strip Use to check blood sugar daily and as directed. Dx 250.00  .  Memantine HCl ER (NAMENDA XR) 28 MG CP24 Take 28 mg by mouth daily.  . metFORMIN (GLUCOPHAGE) 500 MG tablet TAKE 1 TABLET BY MOUTH TWICE A DAY WITH MEALS  . pantoprazole (PROTONIX) 40 MG tablet TAKE ONE TABLET BY MOUTH EVERY MORNING  :  Review of Systems:  Out of a complete 14 point review of systems, all are reviewed and negative with the exception of these symptoms as listed below:   Review of Systems  Neurological:       Sleepy    Objective:  Neurologic Exam  Physical Exam Physical Examination:   Filed Vitals:   12/04/14 1258  BP: 131/72  Pulse: 92  Temp: 97.2 F (36.2 C)   General Examination: The patient is a very pleasant 80 y.o. female in no acute distress. She is in a WC. She is minimally verbal.   HEENT: Normocephalic, atraumatic, pupils are equal, round and reactive to light and accommodation. Extraocular tracking is fair without nystagmus noted. Hearing is grossly intact. Face is symmetric with normal facial animation and normal facial sensation. Speech is clear with no dysarthria noted. There is mild hypophonia. There is no lip, neck or jaw tremor. Neck is supple with full range of motion. Oropharynx exam reveals moderate dryness. No significant airway crowding is noted. Mallampati is class II. Tongue protrudes centrally and palate elevates symmetrically.    Chest: is clear to auscultation without wheezing, rhonchi or crackles noted.  Heart: sounds are for the most part regular with a split second heart sound and extra heartbeats noted with  pauses.   Abdomen: is soft, non-tender and non-distended with normal bowel sounds appreciated on auscultation.  Extremities: There is no pitting edema in the distal lower extremities bilaterally. Pedal pulses are intact.  Skin: is warm and dry with no trophic changes noted.  Musculoskeletal: exam reveals no obvious joint deformities, tenderness or joint swelling or erythema.  Neurologically:  Mental status: The patient is awake, but does not pay much attention. She is calm and cooperative with the exam. She is oriented to self. Her memory, recall, attention, language and knowledge are significantly impaired. There is slowness in thinking. Speech is scarce and low volume, but clear. She speaks in 1-3 word sentences.   On 06/03/2014: Her MMSE is 5/30, clock drawing is 0/4, animal fluency is 2/minute.  On 12/04/2014: MMSE: 10/30, CDT: 1/4, AFT: 5/min. GDS is 3/15.  Affect is normal. Cranial nerves are as described above under HEENT exam. In addition, shoulder shrug is normal with equal shoulder height noted. Motor exam: thin bulk, normal strength and tone is noted for age. There is no drift, tremor or rebound. Romberg is negative. Reflexes are 2+ throughout. Fine motor skills are mildly impaired.   Cerebellar testing shows no dysmetria or intention tremor. There is no truncal or gait ataxia.  Sensory exam is intact to light touch throughout.   Gait, station and balance: I did not have her stand or walk for me today and she did not bring her walker.  Assessment and Plan:   In summary, [name] is a very pleasant 80 year old female with a history of advanced Alzheimers disease without behavioral disturbance. She has continually progressed, but today her numbers are actually better than 6 months ago. She has not had any recent UTI  She has a strong family history of Alzheimer's disease. I encouraged her family to try to make her drink more water. She is on Namenda long-acting and Aricept 5 mg  daily.  I would like to continue with the current medication. We mutually agreed to keep her on Aricept 5 mg and Namenda long-acting 28 mg daily.  I will see her back routinely in 6 months, she has an appointment with her primary care physician next month. She has blood work pending for next month as well. I answered all their questions today and  they were in agreement. I have encouraged him to call with any interim questions, concerns, problems or updates and refill requests.

## 2014-12-22 ENCOUNTER — Other Ambulatory Visit (INDEPENDENT_AMBULATORY_CARE_PROVIDER_SITE_OTHER): Payer: Medicare Other

## 2014-12-22 DIAGNOSIS — I10 Essential (primary) hypertension: Secondary | ICD-10-CM | POA: Diagnosis not present

## 2014-12-22 DIAGNOSIS — E119 Type 2 diabetes mellitus without complications: Secondary | ICD-10-CM

## 2014-12-22 LAB — COMPREHENSIVE METABOLIC PANEL
ALK PHOS: 63 U/L (ref 39–117)
ALT: 8 U/L (ref 0–35)
AST: 18 U/L (ref 0–37)
Albumin: 3.4 g/dL — ABNORMAL LOW (ref 3.5–5.2)
BILIRUBIN TOTAL: 0.7 mg/dL (ref 0.2–1.2)
BUN: 23 mg/dL (ref 6–23)
CO2: 25 mEq/L (ref 19–32)
CREATININE: 0.9 mg/dL (ref 0.4–1.2)
Calcium: 9 mg/dL (ref 8.4–10.5)
Chloride: 105 mEq/L (ref 96–112)
GFR: 64.96 mL/min (ref >60.00)
GLUCOSE: 95 mg/dL (ref 70–99)
Potassium: 4.3 mEq/L (ref 3.5–5.1)
SODIUM: 138 meq/L (ref 135–145)
Total Protein: 6.2 g/dL (ref 6.0–8.3)

## 2014-12-22 LAB — HEMOGLOBIN A1C: Hgb A1c MFr Bld: 7.1 % — ABNORMAL HIGH (ref 4.6–6.5)

## 2014-12-26 ENCOUNTER — Ambulatory Visit (INDEPENDENT_AMBULATORY_CARE_PROVIDER_SITE_OTHER): Payer: Medicare Other | Admitting: Family Medicine

## 2014-12-26 ENCOUNTER — Encounter: Payer: Self-pay | Admitting: Family Medicine

## 2014-12-26 VITALS — BP 126/58 | HR 82 | Temp 97.9°F | Ht 61.0 in | Wt 124.2 lb

## 2014-12-26 DIAGNOSIS — E119 Type 2 diabetes mellitus without complications: Secondary | ICD-10-CM

## 2014-12-26 DIAGNOSIS — I10 Essential (primary) hypertension: Secondary | ICD-10-CM

## 2014-12-26 NOTE — Patient Instructions (Signed)
Javeah is stable  Continue checking glucose once per day - check some after meals (2 hours after a meal)   Follow up for annual exam in 6 month with labs prior

## 2014-12-26 NOTE — Progress Notes (Signed)
Pre visit review using our clinic review tool, if applicable. No additional management support is needed unless otherwise documented below in the visit note. 

## 2014-12-26 NOTE — Progress Notes (Signed)
Subjective:    Patient ID: [name], female    DOB: Oct 02, 1922, 81 y.o.   MRN: [id number]  HPI Here for f/u of chronic health problems   Doing pretty well overall  Had one day of decreased responsiveness and they called EMS- and she perked up and ate and had a good day  Her blood sugar was 150s and vitals were fine  No reason what caused it - tired after going out the day before    Wt is up 3 lb with bmi of 23  bp is stable today  No cp or palpitations or headaches or edema  No side effects to medicines  BP Readings from Last 3 Encounters:  12/26/14 126/58  12/04/14 131/72  07/29/14 134/64    Blood pressures are good at home   Sees neuro for dementia -continues on namenda   Diabetes Home sugar results -not a lot different  DM diet - is the same/ some change over the holidays but not a whole lot more  Appetite is good -eats 100% of her meals  Exercise - little /not mobile  Symptoms none  A1C last  Lab Results  Component Value Date   HGBA1C 7.1* 12/22/2014  this is up from 6.8   No problems with medications  Last eye exam will be due -has that planned next week      Chemistry      Component Value Date/Time   NA 138 12/22/2014 0842   K 4.3 12/22/2014 0842   CL 105 12/22/2014 0842   CO2 25 12/22/2014 0842   BUN 23 12/22/2014 0842   CREATININE 0.9 12/22/2014 0842      Component Value Date/Time   CALCIUM 9.0 12/22/2014 0842   ALKPHOS 63 12/22/2014 0842   AST 18 12/22/2014 0842   ALT 8 12/22/2014 0842   BILITOT 0.7 12/22/2014 0842      Saw neuro Pretty stable with her dementia on aricept and namenda   Patient Active Problem List   Diagnosis Date Noted  . Gait disorder 07/29/2014  . Mobility impaired 07/25/2014  . Encounter for Medicare annual wellness exam 06/24/2014  . Venous insufficiency 08/17/2012  . Syncope 08/08/2012  . Dementia 08/08/2012  . Abnormal EKG 08/08/2012  . Essential hypertension, benign 12/22/2009  . OSTEOPOROSIS NOS  09/24/2007  . GERD 07/16/2007  . Diabetes type 2, controlled 07/13/2007  . HYPERLIPIDEMIA 07/13/2007  . OVERACTIVE BLADDER 07/13/2007  . DEGENERATIVE JOINT DISEASE 07/13/2007  . INCONTINENCE, URGE 07/13/2007   Past Medical History  Diagnosis Date  . Depression     type 2  . Hyperlipidemia   . Osteopenia   . Memory loss, short term 3/09    mild cognitive impairment  . Abnormal CT of the chest 10/08    5 mm nodule- needs f/u CT in 1 year  . Squamous cell carcinoma of leg   . DM type 2 (diabetes mellitus, type 2)   . Dysrhythmia     "irregular at times"  . GERD (gastroesophageal reflux disease)   . Alzheimer disease     "severe" (08/08/2012)   Past Surgical History  Procedure Laterality Date  . Total hip arthroplasty  ? first OR; 02/2005    right X 2  . Ischemic colitis  10/98    colonoscopy  . Squamous cell carcinoma excision  10/2001    on leg  . Pelvic fracture surgery  07/2002    after a fall  . Cataract extraction, bilateral  2010  .  Dexa  11/01    osteopenia  . Vaginal hysterectomy  1972  . Bladder suspension  02/2001    pelvic vaginal sling for incontinence   History  Substance Use Topics  . Smoking status: Never Smoker   . Smokeless tobacco: Never Used  . Alcohol Use: No   Family History  Problem Relation Age of Onset  . Stroke Mother   . Dementia Mother   . Stroke Father   . Breast cancer Sister   . Prostate cancer Brother   . Breast cancer Sister   . Memory loss Sister   . Breast cancer Other     neice  . Alzheimer's disease Sister     older sister  . Memory loss Sister     mild   Allergies  Allergen Reactions  . Alendronate Sodium     REACTION: GI; "don't really remember reaction"  . Risedronate Sodium     REACTION: GI; "don't really remember reaction"   Current Outpatient Prescriptions on File Prior to Visit  Medication Sig Dispense Refill  . aspirin 81 MG tablet Take 81 mg by mouth daily.    Marland Kitchen atorvastatin (LIPITOR) 10 MG tablet TAKE  ONE TABLET BY MOUTH EVERY NIGHT AT BEDTIME 30 tablet 11  . calcitonin, salmon, (MIACALCIN/FORTICAL) 200 UNIT/ACT nasal spray USE 1 SPRAY IN THE NOSE EVERY DAY *ALTERNATING NOSTRILS* 3.7 mL 11  . donepezil (ARICEPT) 5 MG tablet Take 1 tablet (5 mg total) by mouth at bedtime. 90 tablet 3  . glucose blood test strip Use to check blood sugar daily and as directed. Dx 250.00 100 each 6  . Memantine HCl ER (NAMENDA XR) 28 MG CP24 Take 28 mg by mouth daily. 90 capsule 3  . metFORMIN (GLUCOPHAGE) 500 MG tablet TAKE 1 TABLET BY MOUTH TWICE A DAY WITH MEALS 60 tablet 11  . pantoprazole (PROTONIX) 40 MG tablet TAKE ONE TABLET BY MOUTH EVERY MORNING 30 tablet 11   No current facility-administered medications on file prior to visit.    Review of Systems    Review of Systems  Constitutional: Negative for fever, appetite change, and unexpected weight change. pos for sleepiness - pt sleeps much of the day and night  Eyes: Negative for pain and visual disturbance.  Respiratory: Negative for cough and shortness of breath.   Cardiovascular: Negative for cp or palpitations    Gastrointestinal: Negative for nausea, diarrhea and constipation.  Genitourinary: Negative for urgency and frequency.  Skin: Negative for pallor or rash   Neurological: Negative for weakness, light-headedness, numbness and headaches. (pos for one episode of somnolence that quickly resolved) , pos for baseline dementia  Hematological: Negative for adenopathy. Does not bruise/bleed easily.  Psychiatric/Behavioral: Negative for dysphoric mood. The patient is not nervous/anxious.  pos for occ agitation     Objective:   Physical Exam  Constitutional: She appears well-developed and well-nourished. No distress.  Frail appearing elderly female in wheelchair   HENT:  Head: Normocephalic and atraumatic.  Mouth/Throat: Oropharynx is clear and moist.  Eyes: Conjunctivae and EOM are normal. Pupils are equal, round, and reactive to light. No  scleral icterus.  Neck: Normal range of motion. Neck supple. No JVD present. Carotid bruit is not present.  Cardiovascular: Normal rate, regular rhythm and normal heart sounds.   Pulmonary/Chest: Breath sounds normal. No respiratory distress. She has no wheezes. She has no rales.  No crackles   Musculoskeletal: She exhibits no edema.  Lymphadenopathy:    She has no cervical adenopathy.  Neurological: She is alert. She has normal reflexes. She exhibits normal muscle tone. Coordination normal.  Skin: Skin is warm and dry. No pallor.  Psychiatric: Cognition and memory are impaired.  Baseline dementia  Pt answers a few questions  Falls asleep at intervals           Assessment & Plan:   Problem List Items Addressed This Visit      Cardiovascular and Mediastinum   Essential hypertension, benign - Primary    bp in fair control at this time  BP Readings from Last 1 Encounters:  12/26/14 126/58   No changes needed  (no medication at this time)  Disc lifstyle change with low sodium diet and adv to stay as active as she can be        Endocrine   Diabetes type 2, controlled    Lab Results  Component Value Date   HGBA1C 7.1* 12/22/2014   This is up - will continue to watch  At age of 44- do not want to risk hypoglycemia Rev low glycemic diet  Continue to monitor

## 2014-12-28 NOTE — Assessment & Plan Note (Signed)
bp in fair control at this time  BP Readings from Last 1 Encounters:  12/26/14 126/58   No changes needed  (no medication at this time)  Disc lifstyle change with low sodium diet and adv to stay as active as she can be

## 2014-12-28 NOTE — Assessment & Plan Note (Signed)
Lab Results  Component Value Date   HGBA1C 7.1* 12/22/2014   This is up - will continue to watch  At age of 16- do not want to risk hypoglycemia Rev low glycemic diet  Continue to monitor

## 2015-01-20 LAB — HM DIABETES EYE EXAM

## 2015-02-09 ENCOUNTER — Ambulatory Visit (INDEPENDENT_AMBULATORY_CARE_PROVIDER_SITE_OTHER): Payer: Medicare Other | Admitting: Podiatry

## 2015-02-09 DIAGNOSIS — E1140 Type 2 diabetes mellitus with diabetic neuropathy, unspecified: Secondary | ICD-10-CM

## 2015-02-09 DIAGNOSIS — Q828 Other specified congenital malformations of skin: Secondary | ICD-10-CM

## 2015-02-09 DIAGNOSIS — B351 Tinea unguium: Secondary | ICD-10-CM

## 2015-02-09 DIAGNOSIS — M79673 Pain in unspecified foot: Secondary | ICD-10-CM

## 2015-02-09 NOTE — Progress Notes (Signed)
Subjective:     Patient ID: [name], female   DOB: [birth year], 81 y.o.   MRN: [id number]  HPI patient presents with elongated thickened yellow brittle toenails 1-5 with pain when pressed   Review of Systems     Objective:   Physical Exam Neurovascular status intact with thick yellow brittle nailbeds 1-5 both feet    Assessment:     Mycotic nail infection 1-5 both feet    Plan:     Debride painful nailbeds 1-5 both feet with no iatrogenic bleeding noted

## 2015-05-25 ENCOUNTER — Ambulatory Visit: Payer: Medicare Other

## 2015-05-28 ENCOUNTER — Encounter: Payer: Self-pay | Admitting: Podiatry

## 2015-05-28 ENCOUNTER — Ambulatory Visit (INDEPENDENT_AMBULATORY_CARE_PROVIDER_SITE_OTHER): Payer: Medicare Other | Admitting: Podiatry

## 2015-05-28 DIAGNOSIS — E1140 Type 2 diabetes mellitus with diabetic neuropathy, unspecified: Secondary | ICD-10-CM

## 2015-05-28 DIAGNOSIS — B351 Tinea unguium: Secondary | ICD-10-CM | POA: Diagnosis not present

## 2015-05-28 DIAGNOSIS — M79673 Pain in unspecified foot: Secondary | ICD-10-CM | POA: Diagnosis not present

## 2015-05-28 NOTE — Progress Notes (Signed)
Patient ID: CERRIA RANDHAWA, female   DOB: 08-12-1922, 81 y.o.   MRN: [id number] Complaint:  Visit Type: Patient returns to my office for continued preventative foot care services. Complaint: Patient states" my nails have grown long and thick and become painful to walk and wear shoes" Patient has been diagnosed with DM with no complications. He presents for preventative foot care services. No changes to ROS  Podiatric Exam: Vascular: dorsalis pedis and posterior tibial pulses are palpable bilateral. Capillary return isdiminished.  TG decreased Cold feet.  Sensorium: Normal Semmes Weinstein monofilament test. Normal tactile sensation bilaterally. Nail Exam: Pt has thick disfigured discolored nails with subungual debris noted bilateral entire nail hallux through fifth toenails Ulcer Exam: There is no evidence of ulcer or pre-ulcerative changes or infection. Orthopedic Exam: Muscle tone and strength are WNL. No limitations in general ROM. No crepitus or effusions noted. Foot type and digits show no abnormalities. Bony prominences are unremarkable. Skin: No Porokeratosis. No infection or ulcers  Diagnosis:  Tinea unguium, Pain in right toe, pain in left toes  Treatment & Plan Procedures and Treatment: Consent by patient was obtained for treatment procedures. The patient understood the discussion of treatment and procedures well. All questions were answered thoroughly reviewed. Debridement of mycotic and hypertrophic toenails, 1 through 5 bilateral and clearing of subungual debris. No ulceration, no infection noted.  Return Visit-Office Procedure: Patient instructed to return to the office for a follow up visit 3 months for continued evaluation and treatment.

## 2015-06-04 ENCOUNTER — Other Ambulatory Visit: Payer: Self-pay | Admitting: *Deleted

## 2015-06-04 ENCOUNTER — Ambulatory Visit (INDEPENDENT_AMBULATORY_CARE_PROVIDER_SITE_OTHER): Payer: Medicare Other | Admitting: Neurology

## 2015-06-04 ENCOUNTER — Encounter: Payer: Self-pay | Admitting: Neurology

## 2015-06-04 VITALS — BP 113/65 | HR 98 | Ht 61.0 in | Wt 123.8 lb

## 2015-06-04 DIAGNOSIS — F0390 Unspecified dementia without behavioral disturbance: Secondary | ICD-10-CM | POA: Diagnosis not present

## 2015-06-04 MED ORDER — DONEPEZIL HCL 5 MG PO TABS: 5.0000 mg | ORAL_TABLET | Freq: Every day | ORAL | Status: DC

## 2015-06-04 MED ORDER — MEMANTINE HCL ER 28 MG PO CP24: 28.0000 mg | ORAL_CAPSULE | Freq: Every day | ORAL | Status: DC

## 2015-06-04 MED ORDER — PANTOPRAZOLE SODIUM 40 MG PO TBEC: DELAYED_RELEASE_TABLET | ORAL | Status: DC

## 2015-06-04 MED ORDER — CALCITONIN (SALMON) 200 UNIT/ACT NA SOLN: NASAL | Status: DC

## 2015-06-04 MED ORDER — ATORVASTATIN CALCIUM 10 MG PO TABS: ORAL_TABLET | ORAL | Status: DC

## 2015-06-04 MED ORDER — METFORMIN HCL 500 MG PO TABS: ORAL_TABLET | ORAL | Status: DC

## 2015-06-04 NOTE — Patient Instructions (Addendum)
We will continue the same memory medications.   Your exam is fairly stable.   Try to keep engaged in conversations. Do not try to get up without assistance.   Drink plenty of water, 4-6 glasses, and eat well.   Follow up in 6 months.

## 2015-06-04 NOTE — Progress Notes (Signed)
Subjective:    Patient ID: [name] is a 81 y.o. female.  HPI     Interim history:   [name] is a very pleasant 81 year old right-handed woman who presents for followup consultation of her advanced memory loss without behavioral changes. She is accompanied by her sister, [name], and her caretaker, [name], again today. I last saw her on 12/04/2014, at which time her family reported that she had been stable. She had a recent UTI for which she was treated. She had an episode of unresponsiveness for which they had to call EMS. Her blood sugar and blood pressure were fine. There was no report of seizure activity. She came back to baseline and was not taken to the emergency room. At the last visit we mutually agreed to continue with her Namenda long-acting and low-dose Aricept. Her MMSE was 10.   Today, 06/04/2015: Her sister and [name] both report that things have been fairly stable. She seems to have gotten weaker overall. [name] has back issues so she cannot walk with the patient but other caretakers do help her walk with a walker. She has a 2 wheeled walker. Thankfully she has a full-time caretaker almost 24-7. Mercedes comes Tuesdays through Fridays from 8 AM to 2 PM. On Mondays and on weekends there is another caretaker. [name] and this other caretaker are through [facility]. Between 2 PM and 5 PM the patient and her sister are by themselves. The overnight staff calms at 5 PM and states through 8 AM the next day and this is through private caretakers. Her appetite is good. She drinks fluids okay. She often drinks PDL light. She has not had any recent urinary tract infection and thankfully has not fallen recently. Sometimes she gets a little irritable but there are no behavioral changes or outbursts and she is usually very mellow and cooperative.   Previously:   I saw her on 06/03/2014, at which time her family reported that she had a UTI in March 2015 and in January 2015 she  was taken to the ER via EMS secondary to altered mental status and she was found to have a low blood sugar as well as a urinary tract infection. Her 42 year old brother died in 01-12-14. I suggested we continue with Aricept 5 mg and long-acting Namenda 28 mg daily.     I saw her on 11/28/2013, at which time they reported that 5 out of a total 8 total siblings have/had AD, some with behavioral issues. I kept her on Namenda XR 20 mg daily and Aricept 5 mg daily. She was not able to tolerate Aricept at 10 mg daily, because of syncope and history of bradycardia.   She previously saw Dr. Erling Cruz and was last seen by him on 09/21/2012 at which time he felt that she had improved on Namenda. He decreased her Aricept because of a history of syncope.   She lives with her two sisters and was first seen by Dr. Erling Cruz in 2009 with memory loss beginning in 2008 or before. She has short-term episodic memory loss beginning with conversations and in taking her medications She also has procedural memory loss, not knowing how to use the TV remote. 2 other sisters and her brother have dementia. Initial MMSE was 27/30, CDT 4/4, then on 05/06/2009 MMSE 28/30, CDT 4/4, AFT 8. On 09/23/2009 = MMSE 28/30, CDT 4/4. On 10/14/10 = MMSE 26/30, CDT 4/4, AFT 12. Examination was remarkable for severe kyphosis and peripheral neuropathy. Workup for  treatable causes of memory loss was negative including MRI study of the brain, TSH, B12, sedimentation rate, folate, and CBC. She is sleeping well. She no longer dresses herself, or takes care of her toilet needs. She feeds herself. They have a CNA 24/7. She has been accepted for long-term care insurance benefits. She denies depression, and there is no report of halluciation, delusions, outbursts or agitation. She does not exercise. She reads and enjoys television but is reading less than she used to. She does not cook or clean. She makes her bed. She attends church women activities and senior  citizens group activities and a "friend's and Therapist, art. She has bladder incontinence but no bowel incontinence. She uses depends. She does not fall, but her gait is very unsteady. Falls assessment tool score is 17. She does not have bizarre dreams. On 10/19/11 = MMSE 26/30, CDT 4/4, AFT 8, Geriatric depression scale was 2/15.   04/12/2012 = MMSE 15/30, CDT 3/4, AFT 6. The patient passed out on 8/21/213 at the breakfast table without tonic-clonic activity. She was seen at [facility] and thought to be dehydrated with elevated WBC and urinary tract infection. She passed out again on 09/16/2012 at church in the sitting position without tonic-clonic activity or urinary incontinence.She had no warning that she was going to pass out with either incident. On 09/21/2012 = MMSE 22/30, CDT 4/4, AFT 7.   Her Past Medical History Is Significant For: Past Medical History  Diagnosis Date  . Depression     type 2  . Hyperlipidemia   . Osteopenia   . Memory loss, short term 3/09    mild cognitive impairment  . Abnormal CT of the chest 10/08    5 mm nodule- needs f/u CT in 1 year  . Squamous cell carcinoma of leg   . DM type 2 (diabetes mellitus, type 2)   . Dysrhythmia     "irregular at times"  . GERD (gastroesophageal reflux disease)   . Alzheimer disease     "severe" (08/08/2012)    Her Past Surgical History Is Significant For: Past Surgical History  Procedure Laterality Date  . Total hip arthroplasty  ? first OR; 02/2005    right X 2  . Ischemic colitis  10/98    colonoscopy  . Squamous cell carcinoma excision  10/2001    on leg  . Pelvic fracture surgery  07/2002    after a fall  . Cataract extraction, bilateral  2010  . Dexa  11/01    osteopenia  . Vaginal hysterectomy  1972  . Bladder suspension  02/2001    pelvic vaginal sling for incontinence    Her Family History Is Significant For: Family History  Problem Relation Age of Onset  . Stroke Mother   . Dementia Mother    . Stroke Father   . Breast cancer Sister   . Prostate cancer Brother   . Breast cancer Sister   . Memory loss Sister   . Breast cancer Other     neice  . Alzheimer's disease Sister     older sister  . Memory loss Sister     mild    Her Social History Is Significant For: History   Social History  . Marital Status: Single    Spouse Name: N/A  . Number of Children: 0  . Years of Education: college   Occupational History  . Retired Education officer, museum    Social History Main Topics  . Smoking status:  Never Smoker   . Smokeless tobacco: Never Used  . Alcohol Use: No  . Drug Use: No  . Sexual Activity: Not on file   Other Topics Concern  . None   Social History Narrative   Lives with her sister Shelvia Fojtik) who helps care for her (and another sister).   Involved in church activities.    Her Allergies Are:  Allergies  Allergen Reactions  . Alendronate Sodium     REACTION: GI; "don't really remember reaction"  . Risedronate Sodium     REACTION: GI; "don't really remember reaction"  :   Her Current Medications Are:  Outpatient Encounter Prescriptions as of 06/04/2015  Medication Sig  . aspirin 81 MG tablet Take 81 mg by mouth daily.  Marland Kitchen atorvastatin (LIPITOR) 10 MG tablet TAKE ONE TABLET BY MOUTH EVERY NIGHT AT BEDTIME  . calcitonin, salmon, (MIACALCIN/FORTICAL) 200 UNIT/ACT nasal spray USE 1 SPRAY IN THE NOSE EVERY DAY *ALTERNATING NOSTRILS*  . donepezil (ARICEPT) 5 MG tablet Take 1 tablet (5 mg total) by mouth at bedtime.  Marland Kitchen glucose blood test strip Use to check blood sugar daily and as directed. Dx 250.00  . memantine (NAMENDA XR) 28 MG CP24 24 hr capsule Take 1 capsule (28 mg total) by mouth daily.  . metFORMIN (GLUCOPHAGE) 500 MG tablet TAKE 1 TABLET BY MOUTH TWICE A DAY WITH MEALS  . pantoprazole (PROTONIX) 40 MG tablet TAKE ONE TABLET BY MOUTH EVERY MORNING  . [DISCONTINUED] donepezil (ARICEPT) 5 MG tablet Take 1 tablet (5 mg total) by mouth at bedtime.  .  [DISCONTINUED] Memantine HCl ER (NAMENDA XR) 28 MG CP24 Take 28 mg by mouth daily.   No facility-administered encounter medications on file as of 06/04/2015.  :  Review of Systems:  Out of a complete 14 point review of systems, all are reviewed and negative with the exception of these symptoms as listed below:   Review of Systems  Neurological: Positive for weakness.       Memory Dementia Weakness both legs    Objective:  Neurologic Exam  Physical Exam Physical Examination:   Filed Vitals:   06/04/15 1057  BP: 113/65  Pulse: 98   General Examination: The patient is a very pleasant 81 y.o. female in no acute distress. She is in a WC. She is minimally verbal. She appears sleepy and dozes off at times, but is easily arousable and responsive and somewhat interactive.   HEENT: Normocephalic, atraumatic, pupils are equal, round and reactive to light and accommodation. Extraocular tracking is fair without nystagmus noted. Hearing is grossly intact. Face is symmetric with normal facial animation and normal facial sensation. Speech is clear with no dysarthria noted. There is mild hypophonia. There is no lip, neck or jaw tremor. Neck is supple with full range of motion. Oropharynx exam reveals moderate dryness. No significant airway crowding is noted. Mallampati is class II. Tongue protrudes centrally and palate elevates symmetrically.    Chest: is clear to auscultation without wheezing, rhonchi or crackles noted.  Heart: sounds are for the most part regular with a split second heart sound and extra heartbeats noted with pauses, not much changed.   Abdomen: is soft, non-tender and non-distended with normal bowel sounds appreciated on auscultation.  Extremities: There is no pitting edema in the distal lower extremities bilaterally. Pedal pulses are intact.  Skin: is warm and dry with no trophic changes noted.  Musculoskeletal: exam reveals no obvious joint deformities, tenderness or joint  swelling or erythema.  Neurologically:  Mental status: The patient is awake, but does not pay much attention. She is calm and cooperative with the exam. She is oriented to self. Her memory, recall, attention, language and knowledge are significantly impaired. There is slowness in thinking. Speech is scarce and low volume, but clear. She speaks in 1 word sentences.   On 06/03/2014: Her MMSE is 5/30, clock drawing is 0/4, animal fluency is 2/minute.   On 12/04/2014: MMSE: 10/30, CDT: 1/4, AFT: 5/min. GDS is 3/15.  On 06/04/2015: MMSE: 13/30, CDT: 0/4, AFT: 0/min.   Affect is normal. Cranial nerves are as described above under HEENT exam. In addition, shoulder shrug is normal with equal shoulder height noted. Motor exam: thin bulk,  mildly reduced global strength is noted, tone seems normal. There is no drift, tremor or rebound. Romberg is negative. Reflexes are 2+ throughout. Fine motor skills are mildly impaired.   Cerebellar testing shows no dysmetria or intention tremor. There is no truncal or gait ataxia.  Sensory exam is intact to light touch throughout.   Gait, station and balance: I did not have her stand or walk for me today and she did not bring her walker.  Assessment and Plan:   In summary, [name] is a very pleasant 81 year old female with a history of advanced Alzheimers disease without behavioral disturbance. She has continually progressed, but today her MMSE score is actually better than 6 months ago but she could not write a sentence and she could not draw a clock and she did not the many animals today.  her caretakers report that she is slowly becoming weaker. This is typically a function of deconditioning. I encouraged him to make sure she eats well and on regular intervals and drinks plenty of fluid. Medication wise I think we should continue with the same medications. Her memory scores have fluctuated but overall she has progressed with time, as expected. She has a family  history of memory loss in several of her siblings.  We mutually agreed to keep her on Aricept 5 mg and Namenda long-acting 28 mg daily. I renewed her prescriptions for 90 days with refills today. I will see her back routinely in 6 months, sooner if needed. I answered all their questions today and  they were in agreement. I encouraged them to call with any interim questions, concerns, problems or updates and refill requests.  I spent 20 minutes in total face-to-face time with the patient, more than 50% of which was spent in counseling and coordination of care, reviewing test results, reviewing medication and discussing or reviewing the diagnosis of dementia, its prognosis and treatment options.

## 2015-06-18 ENCOUNTER — Telehealth: Payer: Self-pay | Admitting: Family Medicine

## 2015-06-18 DIAGNOSIS — E119 Type 2 diabetes mellitus without complications: Secondary | ICD-10-CM

## 2015-06-18 DIAGNOSIS — E785 Hyperlipidemia, unspecified: Secondary | ICD-10-CM

## 2015-06-18 DIAGNOSIS — I10 Essential (primary) hypertension: Secondary | ICD-10-CM

## 2015-06-18 NOTE — Telephone Encounter (Signed)
-----   Message from [name] sent at 06/12/2015 11:10 AM EDT ----- Regarding: Lab orders for Friday,7.1.16 Patient is scheduled for CPX labs, please order future labs, Thanks , Karna Christmas

## 2015-06-19 ENCOUNTER — Other Ambulatory Visit (INDEPENDENT_AMBULATORY_CARE_PROVIDER_SITE_OTHER): Payer: Medicare Other

## 2015-06-19 DIAGNOSIS — E785 Hyperlipidemia, unspecified: Secondary | ICD-10-CM

## 2015-06-19 DIAGNOSIS — E119 Type 2 diabetes mellitus without complications: Secondary | ICD-10-CM

## 2015-06-19 DIAGNOSIS — I10 Essential (primary) hypertension: Secondary | ICD-10-CM

## 2015-06-19 LAB — LIPID PANEL
CHOL/HDL RATIO: 3
Cholesterol: 137 mg/dL (ref 0–200)
HDL: 49.4 mg/dL (ref >39.00)
LDL Cholesterol: 71 mg/dL (ref 0–99)
NonHDL: 87.6
Triglycerides: 82 mg/dL (ref 0.0–149.0)
VLDL: 16.4 mg/dL (ref 0.0–40.0)

## 2015-06-19 LAB — COMPREHENSIVE METABOLIC PANEL
ALT: 12 U/L (ref 0–35)
AST: 17 U/L (ref 0–37)
Albumin: 3.4 g/dL — ABNORMAL LOW (ref 3.5–5.2)
Alkaline Phosphatase: 75 U/L (ref 39–117)
BUN: 17 mg/dL (ref 6–23)
CO2: 25 mEq/L (ref 19–32)
Calcium: 9.1 mg/dL (ref 8.4–10.5)
Chloride: 102 mEq/L (ref 96–112)
Creatinine, Ser: 0.85 mg/dL (ref 0.40–1.20)
GFR: 66.66 mL/min (ref >60.00)
Glucose, Bld: 95 mg/dL (ref 70–99)
Potassium: 4.4 mEq/L (ref 3.5–5.1)
Sodium: 135 mEq/L (ref 135–145)
Total Bilirubin: 0.5 mg/dL (ref 0.2–1.2)
Total Protein: 6.5 g/dL (ref 6.0–8.3)

## 2015-06-19 LAB — CBC WITH DIFFERENTIAL/PLATELET
Basophils Absolute: 0 10*3/uL (ref 0.0–0.1)
Basophils Relative: 0.2 % (ref 0.0–3.0)
EOS PCT: 1.6 % (ref 0.0–5.0)
Eosinophils Absolute: 0.2 10*3/uL (ref 0.0–0.7)
HCT: 34.5 % — ABNORMAL LOW (ref 36.0–46.0)
HEMOGLOBIN: 11.1 g/dL — AB (ref 12.0–15.0)
Lymphocytes Relative: 11.2 % — ABNORMAL LOW (ref 12.0–46.0)
Lymphs Abs: 1.1 10*3/uL (ref 0.7–4.0)
MCHC: 32.3 g/dL (ref 30.0–36.0)
MCV: 86.1 fl (ref 78.0–100.0)
MONOS PCT: 7 % (ref 3.0–12.0)
Monocytes Absolute: 0.7 10*3/uL (ref 0.1–1.0)
Neutro Abs: 7.8 10*3/uL — ABNORMAL HIGH (ref 1.4–7.7)
Neutrophils Relative %: 80 % — ABNORMAL HIGH (ref 43.0–77.0)
Platelets: 328 10*3/uL (ref 150.0–400.0)
RBC: 4.01 Mil/uL (ref 3.87–5.11)
RDW: 15.2 % (ref 11.5–15.5)
WBC: 9.8 10*3/uL (ref 4.0–10.5)

## 2015-06-19 LAB — HEMOGLOBIN A1C: Hgb A1c MFr Bld: 6.6 % — ABNORMAL HIGH (ref 4.6–6.5)

## 2015-06-19 LAB — TSH: TSH: 4.79 u[IU]/mL — ABNORMAL HIGH (ref 0.35–4.50)

## 2015-06-26 ENCOUNTER — Ambulatory Visit (INDEPENDENT_AMBULATORY_CARE_PROVIDER_SITE_OTHER): Payer: Medicare Other | Admitting: Family Medicine

## 2015-06-26 ENCOUNTER — Encounter: Payer: Self-pay | Admitting: Family Medicine

## 2015-06-26 VITALS — BP 130/78 | HR 91 | Temp 96.3°F | Wt 127.8 lb

## 2015-06-26 DIAGNOSIS — M810 Age-related osteoporosis without current pathological fracture: Secondary | ICD-10-CM

## 2015-06-26 DIAGNOSIS — E119 Type 2 diabetes mellitus without complications: Secondary | ICD-10-CM

## 2015-06-26 DIAGNOSIS — Z0000 Encounter for general adult medical examination without abnormal findings: Secondary | ICD-10-CM | POA: Diagnosis not present

## 2015-06-26 DIAGNOSIS — E785 Hyperlipidemia, unspecified: Secondary | ICD-10-CM

## 2015-06-26 DIAGNOSIS — R7989 Other specified abnormal findings of blood chemistry: Secondary | ICD-10-CM | POA: Insufficient documentation

## 2015-06-26 DIAGNOSIS — F0390 Unspecified dementia without behavioral disturbance: Secondary | ICD-10-CM

## 2015-06-26 NOTE — Patient Instructions (Signed)
I'm glad you are doing well  Labs are fairly stable except the thyroid test was off a bit - I would like to re check a thyroid profile lab test in 1-2 months  Please schedule non fasting labs in 1-2 months  Follow up in 6 months for regular visit (with labs prior)   Continue regular meals Home safety to prevent falls

## 2015-06-26 NOTE — Progress Notes (Signed)
Pre visit review using our clinic review tool, if applicable. No additional management support is needed unless otherwise documented below in the visit note. 

## 2015-06-26 NOTE — Progress Notes (Signed)
Subjective:    Patient ID: [name], female    DOB: 1922/06/12, 81 y.o.   MRN: [id number]  HPI Here for annual medicare wellness visit as well as chronic/acute medical problems as well as annual preventative exam  I have personally reviewed the Medicare Annual Wellness questionnaire and have noted 1. The patient's medical and social history 2. Their use of alcohol, tobacco or illicit drugs 3. Their current medications and supplements 4. The patient's functional ability including ADL's, fall risks, home safety risks and hearing or visual             impairment. 5. Diet and physical activities 6. Evidence for depression or mood disorders  The patients weight, height, BMI have been recorded in the chart and visual acuity is per eye clinic.  I have made referrals, counseling and provided education to the patient based review of the above and I have provided the pt with a written personalized care plan for preventive services. Reviewed and updated provider list, see scanned forms.  Wt is up 4 lb with bmi of 24  Keeps a good appetite   See scanned forms.  Routine anticipatory guidance given to patient.  See health maintenance. Colon cancer screening- declines at her age  Breast cancer screening- 5/12 mammogram, declines any further breast cancer screening  Self breast exam- no lumps  Flu vaccine 9/15  Tetanus vaccine 7/10 Pneumovax complete 7/15  Zoster vaccine 8/14  dexa -cannot tolerate further due to mobility impairment / no fractures / one near fall when she got up quickly (now is supervised) Advance directive-- has living will and POA written up  Cognitive function addressed- see scanned forms- and if abnormal then additional documentation follows. - dementia continues to worsen - she continues on namenda and aricept  Tries to stay social/ church and a few meetings  Very poor short term memory    PMH and [place] reviewed  Meds, vitals, and allergies reviewed.   ROS: See  HPI.  Otherwise negative.    Mild anemia Lab Results  Component Value Date   WBC 9.8 06/19/2015   HGB 11.1* 06/19/2015   HCT 34.5* 06/19/2015   MCV 86.1 06/19/2015   PLT 328.0 06/19/2015   DM2 improved Lab Results  Component Value Date   HGBA1C 6.6* 06/19/2015   Down from 7.5 Family is thrilled with that  Is avoiding sweets and limiting carbs   OP Declines dexa D level is 42  Dementia= worsening, on aricept and namenda/ family still cares for her  Has regular neuro f/u  No accidents   Hyperlipidemia Lab Results  Component Value Date   CHOL 137 06/19/2015   CHOL 145 06/18/2014   CHOL 155 12/20/2013   Lab Results  Component Value Date   HDL 49.40 06/19/2015   HDL 63.30 06/18/2014   HDL 55.00 12/20/2013   Lab Results  Component Value Date   LDLCALC 71 06/19/2015   LDLCALC 68 06/18/2014   LDLCALC 86 12/20/2013   Lab Results  Component Value Date   TRIG 82.0 06/19/2015   TRIG 67.0 06/18/2014   TRIG 70.0 12/20/2013   Lab Results  Component Value Date   CHOLHDL 3 06/19/2015   CHOLHDL 2 06/18/2014   CHOLHDL 3 12/20/2013   No results found for: LDLDIRECT    on lipitor and good diet - stays in good control  Some fresh veg from garden this season   Lab Results  Component Value Date   TSH 4.79* 06/19/2015  Chemistry      Component Value Date/Time   NA 135 06/19/2015 0833   K 4.4 06/19/2015 0833   CL 102 06/19/2015 0833   CO2 25 06/19/2015 0833   BUN 17 06/19/2015 0833   CREATININE 0.85 06/19/2015 0833      Component Value Date/Time   CALCIUM 9.1 06/19/2015 0833   ALKPHOS 75 06/19/2015 0833   AST 17 06/19/2015 0833   ALT 12 06/19/2015 0833   BILITOT 0.5 06/19/2015 0833      tsh was slight high  Lab Results  Component Value Date   TSH 4.79* 06/19/2015   will re check this   Patient Active Problem List   Diagnosis Date Noted  . Gait disorder 07/29/2014  . Mobility impaired 07/25/2014  . Encounter for Medicare annual wellness exam  06/24/2014  . Venous insufficiency 08/17/2012  . Syncope 08/08/2012  . Dementia 08/08/2012  . Abnormal EKG 08/08/2012  . Essential hypertension, benign 12/22/2009  . OSTEOPOROSIS NOS 09/24/2007  . GERD 07/16/2007  . Diabetes type 2, controlled 07/13/2007  . Hyperlipidemia 07/13/2007  . OVERACTIVE BLADDER 07/13/2007  . DEGENERATIVE JOINT DISEASE 07/13/2007  . INCONTINENCE, URGE 07/13/2007   Past Medical History  Diagnosis Date  . Depression     type 2  . Hyperlipidemia   . Osteopenia   . Memory loss, short term 3/09    mild cognitive impairment  . Abnormal CT of the chest 10/08    5 mm nodule- needs f/u CT in 1 year  . Squamous cell carcinoma of leg   . DM type 2 (diabetes mellitus, type 2)   . Dysrhythmia     "irregular at times"  . GERD (gastroesophageal reflux disease)   . Alzheimer disease     "severe" (08/08/2012)   Past Surgical History  Procedure Laterality Date  . Total hip arthroplasty  ? first OR; 02/2005    right X 2  . Ischemic colitis  10/98    colonoscopy  . Squamous cell carcinoma excision  10/2001    on leg  . Pelvic fracture surgery  07/2002    after a fall  . Cataract extraction, bilateral  2010  . Dexa  11/01    osteopenia  . Vaginal hysterectomy  1972  . Bladder suspension  02/2001    pelvic vaginal sling for incontinence   History  Substance Use Topics  . Smoking status: Never Smoker   . Smokeless tobacco: Never Used  . Alcohol Use: No   Family History  Problem Relation Age of Onset  . Stroke Mother   . Dementia Mother   . Stroke Father   . Breast cancer Sister   . Prostate cancer Brother   . Breast cancer Sister   . Memory loss Sister   . Breast cancer Other     neice  . Alzheimer's disease Sister     older sister  . Memory loss Sister     mild   Allergies  Allergen Reactions  . Alendronate Sodium     REACTION: GI; "don't really remember reaction"  . Risedronate Sodium     REACTION: GI; "don't really remember reaction"    Current Outpatient Prescriptions on File Prior to Visit  Medication Sig Dispense Refill  . aspirin 81 MG tablet Take 81 mg by mouth daily.    Marland Kitchen atorvastatin (LIPITOR) 10 MG tablet TAKE ONE TABLET BY MOUTH EVERY NIGHT AT BEDTIME 90 tablet 0  . calcitonin, salmon, (MIACALCIN/FORTICAL) 200 UNIT/ACT nasal spray USE 1  SPRAY IN THE NOSE EVERY DAY *ALTERNATING NOSTRILS* 11.1 mL 0  . donepezil (ARICEPT) 5 MG tablet Take 1 tablet (5 mg total) by mouth at bedtime. 90 tablet 3  . glucose blood test strip Use to check blood sugar daily and as directed. Dx 250.00 100 each 6  . memantine (NAMENDA XR) 28 MG CP24 24 hr capsule Take 1 capsule (28 mg total) by mouth daily. 90 capsule 3  . metFORMIN (GLUCOPHAGE) 500 MG tablet TAKE 1 TABLET BY MOUTH TWICE A DAY WITH MEALS 180 tablet 0  . pantoprazole (PROTONIX) 40 MG tablet TAKE ONE TABLET BY MOUTH EVERY MORNING 90 tablet 0   No current facility-administered medications on file prior to visit.    Review of Systems Review of Systems  Constitutional: Negative for fever, appetite change,  and unexpected weight change. pos for baseline sleepiness - sleeps and dozes frequently  Eyes: Negative for pain and visual disturbance.  Respiratory: Negative for cough and shortness of breath.   Cardiovascular: Negative for cp or palpitations    Gastrointestinal: Negative for nausea, diarrhea and constipation.  Genitourinary: Negative for urgency and frequency.  Skin: Negative for pallor or rash   Neurological: Negative for weakness, light-headedness, numbness and headaches.  Hematological: Negative for adenopathy. Does not bruise/bleed easily.  Psychiatric/Behavioral: Negative for dysphoric mood. The patient is rarely agitated, pos for advanced dementia        Objective:   Physical Exam  Constitutional: She appears well-developed and well-nourished. No distress.  Frail appearing elderly female dozing in wheelchair-unable to get up on the table   HENT:  Head:  Normocephalic and atraumatic.  Right Ear: External ear normal.  Left Ear: External ear normal.  Nose: Nose normal.  Mouth/Throat: Oropharynx is clear and moist.  Eyes: Conjunctivae and EOM are normal. Pupils are equal, round, and reactive to light. Right eye exhibits no discharge. Left eye exhibits no discharge. No scleral icterus.  Neck: Normal range of motion. Neck supple. No JVD present. Carotid bruit is not present. No thyromegaly present.  Cardiovascular: Normal rate, regular rhythm, normal heart sounds and intact distal pulses.  Exam reveals no gallop.   Pulmonary/Chest: Effort normal and breath sounds normal. No respiratory distress. She has no wheezes. She has no rales.  Abdominal: Soft. Bowel sounds are normal. She exhibits no distension and no mass. There is no tenderness.  Musculoskeletal: She exhibits no edema or tenderness.  Pos for kyphosis and scoliosis  Some changes of OA in hands   Lymphadenopathy:    She has no cervical adenopathy.  Neurological: She is alert. She has normal reflexes. No cranial nerve deficit. She exhibits normal muscle tone. Coordination normal.  Skin: Skin is warm and dry. No rash noted. No erythema. No pallor.  Many SKs on trunk   Psychiatric: She is not agitated. Cognition and memory are impaired. She exhibits abnormal recent memory.  Dozes at intervals but answers some questions appropriately  Seems content and smiles           Assessment & Plan:   Problem List Items Addressed This Visit    Abnormal TSH - Primary    Lab Results  Component Value Date   TSH 4.79* 06/19/2015   This is slt high  No clinical changes Will re check this as well as free T4 in 1-2 mo        Relevant Orders   TSH   T4, Free   Dementia    This is mod to severe now  Sister is able to care for her with the help of other family  Continues aricept and namenda No accidents / seems content today        Diabetes type 2, controlled    Lab Results  Component  Value Date   HGBA1C 6.6* 06/19/2015   Per caregiver her appetite is still good despite dementia Watches what she feeds her carefully No complications       Encounter for Medicare annual wellness exam    Reviewed health habits including diet and exercise and skin cancer prevention Reviewed appropriate screening tests for age  Also reviewed health mt list, fam hx and immunization status , as well as social and family history   Fall prev disc  Lab rev in detail  Declines colon and breast cancer screening at her age Family provides good care       Hyperlipidemia    Disc goals for lipids and reasons to control them Rev labs with pt Rev low sat fat diet in detail Stable with atorvastatin and diet       Osteoporosis    See overview-unable to do dexa due to immobility and dementia Family has instituted extreme fall precautions  No falls or fractures Enc continue ca and D      Routine general medical examination at a health care facility    Reviewed health habits including diet and exercise and skin cancer prevention Reviewed appropriate screening tests for age  Also reviewed health mt list, fam hx and immunization status , as well as social and family history   Fall prev disc  Lab rev in detail  Declines colon and breast cancer screening at her age Family provides good care

## 2015-06-27 NOTE — Assessment & Plan Note (Signed)
Reviewed health habits including diet and exercise and skin cancer prevention Reviewed appropriate screening tests for age  Also reviewed health mt list, fam hx and immunization status , as well as social and family history   Fall prev disc  Lab rev in detail  Declines colon and breast cancer screening at her age Family provides good care

## 2015-06-27 NOTE — Assessment & Plan Note (Signed)
Lab Results  Component Value Date   TSH 4.79* 06/19/2015   This is slt high  No clinical changes Will re check this as well as free T4 in 1-2 mo

## 2015-06-27 NOTE — Assessment & Plan Note (Signed)
This is mod to severe now  Sister is able to care for her with the help of other family  Continues aricept and namenda No accidents / seems content today

## 2015-06-27 NOTE — Assessment & Plan Note (Signed)
See overview-unable to do dexa due to immobility and dementia Family has instituted extreme fall precautions  No falls or fractures Enc continue ca and D

## 2015-06-27 NOTE — Assessment & Plan Note (Signed)
Disc goals for lipids and reasons to control them Rev labs with pt Rev low sat fat diet in detail Stable with atorvastatin and diet

## 2015-06-27 NOTE — Assessment & Plan Note (Signed)
Lab Results  Component Value Date   HGBA1C 6.6* 06/19/2015   Per caregiver her appetite is still good despite dementia Watches what she feeds her carefully No complications

## 2015-07-27 ENCOUNTER — Encounter: Payer: Self-pay | Admitting: Family Medicine

## 2015-07-27 ENCOUNTER — Ambulatory Visit (INDEPENDENT_AMBULATORY_CARE_PROVIDER_SITE_OTHER): Payer: Medicare Other | Admitting: Family Medicine

## 2015-07-27 VITALS — BP 130/64 | HR 81 | Temp 97.6°F | Ht 60.0 in | Wt 126.8 lb

## 2015-07-27 DIAGNOSIS — B029 Zoster without complications: Secondary | ICD-10-CM

## 2015-07-27 MED ORDER — VALACYCLOVIR HCL 1 G PO TABS: 1000.0000 mg | ORAL_TABLET | Freq: Three times a day (TID) | ORAL | Status: DC

## 2015-07-27 NOTE — Progress Notes (Signed)
Subjective:    Patient ID: [name], female    DOB: [birth year], 81 y.o.   MRN: [id number]  HPI Here with back pain and uri symptoms   Started with back pain yesterday - and then developed a red spot this am - worried about shingles Not c/o now/ was this am  Did not put bra on today   Did have a shingles vaccine   Also some upper resp symptoms- mild cough (dry) last night  No fever Has to blow her nose last night   No facial pain or ST   Patient Active Problem List   Diagnosis Date Noted  . Abnormal TSH 06/26/2015  . Routine general medical examination at a health care facility 06/26/2015  . Gait disorder 07/29/2014  . Mobility impaired 07/25/2014  . Encounter for Medicare annual wellness exam 06/24/2014  . Venous insufficiency 08/17/2012  . Syncope 08/08/2012  . Dementia 08/08/2012  . Abnormal EKG 08/08/2012  . Essential hypertension, benign 12/22/2009  . Osteoporosis 09/24/2007  . GERD 07/16/2007  . Diabetes type 2, controlled 07/13/2007  . Hyperlipidemia 07/13/2007  . OVERACTIVE BLADDER 07/13/2007  . DEGENERATIVE JOINT DISEASE 07/13/2007  . INCONTINENCE, URGE 07/13/2007   Past Medical History  Diagnosis Date  . Depression     type 2  . Hyperlipidemia   . Osteopenia   . Memory loss, short term 3/09    mild cognitive impairment  . Abnormal CT of the chest 10/08    5 mm nodule- needs f/u CT in 1 year  . Squamous cell carcinoma of leg   . DM type 2 (diabetes mellitus, type 2)   . Dysrhythmia     "irregular at times"  . GERD (gastroesophageal reflux disease)   . Alzheimer disease     "severe" (08/08/2012)   Past Surgical History  Procedure Laterality Date  . Total hip arthroplasty  ? first OR; 02/2005    right X 2  . Ischemic colitis  10/98    colonoscopy  . Squamous cell carcinoma excision  10/2001    on leg  . Pelvic fracture surgery  07/2002    after a fall  . Cataract extraction, bilateral  2010  . Dexa  11/01    osteopenia  . Vaginal  hysterectomy  1972  . Bladder suspension  02/2001    pelvic vaginal sling for incontinence   History  Substance Use Topics  . Smoking status: Never Smoker   . Smokeless tobacco: Never Used  . Alcohol Use: No   Family History  Problem Relation Age of Onset  . Stroke Mother   . Dementia Mother   . Stroke Father   . Breast cancer Sister   . Prostate cancer Brother   . Breast cancer Sister   . Memory loss Sister   . Breast cancer Other     neice  . Alzheimer's disease Sister     older sister  . Memory loss Sister     mild   Allergies  Allergen Reactions  . Alendronate Sodium     REACTION: GI; "don't really remember reaction"  . Risedronate Sodium     REACTION: GI; "don't really remember reaction"   Current Outpatient Prescriptions on File Prior to Visit  Medication Sig Dispense Refill  . aspirin 81 MG tablet Take 81 mg by mouth daily.    Marland Kitchen atorvastatin (LIPITOR) 10 MG tablet TAKE ONE TABLET BY MOUTH EVERY NIGHT AT BEDTIME 90 tablet 0  . calcitonin, salmon, (MIACALCIN/FORTICAL) 200  UNIT/ACT nasal spray USE 1 SPRAY IN THE NOSE EVERY DAY *ALTERNATING NOSTRILS* 11.1 mL 0  . donepezil (ARICEPT) 5 MG tablet Take 1 tablet (5 mg total) by mouth at bedtime. 90 tablet 3  . glucose blood test strip Use to check blood sugar daily and as directed. Dx 250.00 100 each 6  . memantine (NAMENDA XR) 28 MG CP24 24 hr capsule Take 1 capsule (28 mg total) by mouth daily. 90 capsule 3  . metFORMIN (GLUCOPHAGE) 500 MG tablet TAKE 1 TABLET BY MOUTH TWICE A DAY WITH MEALS 180 tablet 0  . pantoprazole (PROTONIX) 40 MG tablet TAKE ONE TABLET BY MOUTH EVERY MORNING 90 tablet 0   No current facility-administered medications on file prior to visit.    Review of Systems Review of Systems  Constitutional: Negative for fever, appetite change, fatigue and unexpected weight change.  Eyes: Negative for pain and visual disturbance.  ENT pos for rhinorrhea/neg for ST or headache  Respiratory: Negative for  cough and shortness of breath.   Cardiovascular: Negative for cp or palpitations    Gastrointestinal: Negative for nausea, diarrhea and constipation.  Genitourinary: Negative for urgency and frequency.  Skin: Negative for pallor and pos for rash that is tender Neurological: Negative for weakness, light-headedness, numbness and headaches.  Hematological: Negative for adenopathy. Does not bruise/bleed easily.  Psychiatric/Behavioral: Negative for dysphoric mood. The patient is not nervous/anxious.         Objective:   Physical Exam  Constitutional: She appears well-developed and well-nourished. No distress.  Frail appearing female with dementia in wheelchair/dozing off   HENT:  Head: Normocephalic and atraumatic.  Right Ear: External ear normal.  Left Ear: External ear normal.  Mouth/Throat: Oropharynx is clear and moist.  Nares are boggy Mild clear rhinorrhea/post nasal drip   Eyes: Conjunctivae and EOM are normal. Pupils are equal, round, and reactive to light.  Neck: Normal range of motion. Neck supple.  Cardiovascular: Normal rate and regular rhythm.   Pulmonary/Chest: Effort normal and breath sounds normal. No respiratory distress. She has no wheezes. She has no rales.  Lymphadenopathy:    She has no cervical adenopathy.  Neurological: She is alert.  Skin: Skin is warm and dry. Rash noted. There is erythema.  Erythematous rash over top of L shoulder- tender/sensitive to the touch     Psychiatric: She has a normal mood and affect. Cognition and memory are impaired. She exhibits abnormal recent memory.  Baseline dementia- occ answers a question Dozes off frequently          Assessment & Plan:   Problem List Items Addressed This Visit    Herpes zoster - Primary    Pain yesterday and redness today on top of L shoulder Suspect early zoster  Had vaccine tx with valcyclivir - to help slow down/shorten duration Disc expectations Will update if she needs pain control  (comfortable now) Avoid immunocompromised people  update      Relevant Medications   valACYclovir (VALTREX) 1000 MG tablet

## 2015-07-27 NOTE — Progress Notes (Signed)
Pre visit review using our clinic review tool, if applicable. No additional management support is needed unless otherwise documented below in the visit note. 

## 2015-07-27 NOTE — Assessment & Plan Note (Signed)
Pain yesterday and redness today on top of L shoulder Suspect early zoster  Had vaccine tx with valcyclivir - to help slow down/shorten duration Disc expectations Will update if she needs pain control (comfortable now) Avoid immunocompromised people  update

## 2015-07-27 NOTE — Patient Instructions (Signed)
I think the redness on top of the shoulder is early shingles  Give the valcyclovir as directed  Keep area clean and dry - do keep clothing from rubbing on it   Keep me updated

## 2015-07-28 ENCOUNTER — Telehealth: Payer: Self-pay

## 2015-07-28 MED ORDER — HYDROCODONE-ACETAMINOPHEN 5-325 MG PO TABS: ORAL_TABLET | ORAL | Status: DC

## 2015-07-28 NOTE — Telephone Encounter (Signed)
Vaughan Basta pts niece left v/m;Per Vaughan Basta pt was seen 07/27/15 dx with shingles; pt is having a lot of pain and itching. Linda request med for pain and itching to [facility]. Linda request cb. Staff message noted Dr Glori Bickers may not be on computer today.

## 2015-07-28 NOTE — Telephone Encounter (Signed)
Dr. Glori Bickers last note reviewed. I am afraid Neurontin will be too sedating RX for Norco 5/325 1/2 - 1 tab every 8 hours prn pain

## 2015-07-28 NOTE — Addendum Note (Signed)
Addended by: Jearld Fenton on: 07/28/2015 02:57 PM   Modules accepted: Orders

## 2015-07-28 NOTE — Telephone Encounter (Signed)
[name] came by office to ck on pain rx. Norco rx signed for and given to Gerlean Ren; linda will cb if needed.

## 2015-07-30 ENCOUNTER — Telehealth: Payer: Self-pay | Admitting: Family Medicine

## 2015-07-30 NOTE — Telephone Encounter (Signed)
Dr Glori Bickers is out of office until 08/03/15 with limited access of computer.Please advise.

## 2015-07-30 NOTE — Telephone Encounter (Signed)
Ensure passing gas and no abd pain. First step is to increase water intake.  Also suggest decrease hydrocodone to just 1/2 tablet at a time if that is enough to control pain. May start OTC miralax 1/2 -1 capful in 8oz liquid daily prn constipation, hold for diarrhea. Update [place] tomorrow with effect.

## 2015-07-30 NOTE — Telephone Encounter (Signed)
[facility] Patient Name: FRAN NEISWONGER DOB: May 31, 1922 Initial Comment Caller states aunt was dx with shingles; came back Tues. to get pain medication; also has alzheimers. constipated; 99.3 temp; Bp 129/63 hr 91 Nurse Assessment Nurse: Germain Osgood, RN, Opal Sidles Date/Time Eilene Ghazi Time): 07/30/2015 4:03:10 PM Confirm and document reason for call. If symptomatic, describe symptoms. ---Caller reports her mother has Alzhemier's unable to give permission. Monday was diagnosed with shingles left shoulders, placed on alcyclovir and Hydrcodone 1/2 -1 tab. Temp 99.3 ear. Has only Had 4 doses since Tuesday. Is constipated. Has only had about 10-12 ounces fluids today Stool is hard. Feels shingles are managed. Wanting to know what to do about constipation. Has the patient traveled out of the country within the last 30 days? ---No Does the patient require triage? ---Yes Related visit to physician within the last 2 weeks? ---Yes Does the PT have any chronic conditions? (i.e. diabetes, asthma, etc.) ---YesList chronic conditions. ---Alzheimer's Dementia, Diabetes, Guidelines Guideline Title Affirmed Question Affirmed Notes Constipation Taking new prescription medication Final Disposition User Call PCP when Office is Open Germain Osgood, RN, Chapman Moss asked GADFEL to send report to office and fax to office Disagree/Comply: Comply

## 2015-07-31 NOTE — Telephone Encounter (Signed)
Sister Vonette Grosso notified of Dr. Synthia Innocent comments/recommendations and will try to increase her water intake and start miralax. They are already giving pt 1/2 tab of hydrocodone at a time

## 2015-08-21 ENCOUNTER — Telehealth: Payer: Self-pay | Admitting: Family Medicine

## 2015-08-21 NOTE — Telephone Encounter (Signed)
I agree with the home care advice given - f/u if this worsens or does not improve  Change position frequently to avoid pressure points

## 2015-08-21 NOTE — Telephone Encounter (Signed)
PLEASE NOTE: All timestamps contained within this report are represented as [place] Standard Time. CONFIDENTIALTY NOTICE: This fax transmission is intended only for the addressee. It contains information that is legally privileged, confidential or otherwise protected from use or disclosure. If you are not the intended recipient, you are strictly prohibited from reviewing, disclosing, copying using or disseminating any of this information or taking any action in reliance on or regarding this information. If you have received this fax in error, please notify us immediately by telephone so that we can arrange for its return to [place]. Phone: [phone], Toll-Free: [phone], Fax: [phone] Page: 1 of 1 Call Id: [id number] [name] Patient Name: [name] DOB: 1922-03-29 Initial Comment caller states patient has pressure sores on her bottom (haven't ruptured yet but are purple) what can she put on it Nurse Assessment Nurse: Donalynn Furlong, RN, Myna Hidalgo Date/Time Eilene Ghazi Time): 08/21/2015 2:25:03 PM Confirm and document reason for call. If symptomatic, describe symptoms. ---caller states patient has pressure sores on her bottom (haven't ruptured yet but are purple) what can she put on it. Caller cares for pt (niece) states there is no actual breakdown, however it is reddened, "What can we do to prevent that?" Pt otherwise eating well and no other s/s. ASA therapy for "clot prevention". "She likes to sit around in her lounge chair all day". Pt uses bathroom but wears a brief as well for USI. Has the patient traveled out of the country within the last 30 days? ---No Does the patient require triage? ---No Please document clinical information provided and list any resource used. ---Referencing Nursing Experience, discussed ways to promote position/pressure point changes with pt q 1-2 hrs, and  practical ways to implement this while pt in "lounger chair as well". Also discussed methods to enhance "a dry perineum", as well as skin protectants available for maintained integrity. Discussed importance of balanced diet, including protein-rich foods and vegetables and fruits high in Vit C, to be included in pt diet. Caller w good comprehension, will call w any questions, very appreciative Guidelines Guideline Title Affirmed Question Affirmed Notes Final Disposition User Clinical Call Donalynn Furlong, RN, Myna Hidalgo

## 2015-08-21 NOTE — Telephone Encounter (Signed)
Called patient but was unable to reach or leave a voicemail (phone line kept ringing).

## 2015-08-26 ENCOUNTER — Other Ambulatory Visit: Payer: Medicare Other

## 2015-08-27 ENCOUNTER — Ambulatory Visit (INDEPENDENT_AMBULATORY_CARE_PROVIDER_SITE_OTHER): Payer: Medicare Other

## 2015-08-27 ENCOUNTER — Other Ambulatory Visit (INDEPENDENT_AMBULATORY_CARE_PROVIDER_SITE_OTHER): Payer: Medicare Other

## 2015-08-27 ENCOUNTER — Encounter: Payer: Self-pay | Admitting: *Deleted

## 2015-08-27 DIAGNOSIS — Z23 Encounter for immunization: Secondary | ICD-10-CM | POA: Diagnosis not present

## 2015-08-27 DIAGNOSIS — I10 Essential (primary) hypertension: Secondary | ICD-10-CM

## 2015-08-27 DIAGNOSIS — R7989 Other specified abnormal findings of blood chemistry: Secondary | ICD-10-CM | POA: Diagnosis not present

## 2015-08-27 LAB — TSH: TSH: 3.6 u[IU]/mL (ref 0.35–4.50)

## 2015-08-27 LAB — T4, FREE: Free T4: 0.96 ng/dL (ref 0.60–1.60)

## 2015-09-08 ENCOUNTER — Other Ambulatory Visit: Payer: Self-pay | Admitting: Family Medicine

## 2015-09-22 ENCOUNTER — Other Ambulatory Visit: Payer: Self-pay | Admitting: Family Medicine

## 2015-10-01 ENCOUNTER — Ambulatory Visit: Payer: Medicare Other | Admitting: Podiatry

## 2015-10-01 ENCOUNTER — Encounter: Payer: Self-pay | Admitting: Podiatry

## 2015-10-01 ENCOUNTER — Ambulatory Visit (INDEPENDENT_AMBULATORY_CARE_PROVIDER_SITE_OTHER): Payer: Medicare Other | Admitting: Podiatry

## 2015-10-01 VITALS — BP 109/75 | HR 89 | Resp 16

## 2015-10-01 DIAGNOSIS — M79673 Pain in unspecified foot: Secondary | ICD-10-CM

## 2015-10-01 DIAGNOSIS — B351 Tinea unguium: Secondary | ICD-10-CM | POA: Diagnosis not present

## 2015-10-02 NOTE — Progress Notes (Signed)
Subjective:     Patient ID: [name], female   DOB: [birth year], 81 y.o.   MRN: [id number]  HPI patient presents with thick and elongated nailbeds 1-5 both feet that are painful   Review of Systems     Objective:   Physical Exam Neurovascular status intact with thick brittle nailbeds 1-5 both feet with no iatrogenic bleeding noted    Assessment:     Mycotic nail infection 1-5 both feet with pain    Plan:     Debride lesions 1-5 both feet with no iatrogenic bleeding noted

## 2015-10-07 ENCOUNTER — Other Ambulatory Visit: Payer: Self-pay | Admitting: Family Medicine

## 2015-10-23 ENCOUNTER — Other Ambulatory Visit: Payer: Self-pay | Admitting: Family Medicine

## 2015-11-23 ENCOUNTER — Encounter: Payer: Self-pay | Admitting: Family Medicine

## 2015-11-23 ENCOUNTER — Ambulatory Visit (INDEPENDENT_AMBULATORY_CARE_PROVIDER_SITE_OTHER): Payer: Medicare Other | Admitting: Family Medicine

## 2015-11-23 VITALS — BP 138/64 | HR 90 | Temp 98.1°F | Ht 60.0 in

## 2015-11-23 DIAGNOSIS — R05 Cough: Secondary | ICD-10-CM | POA: Diagnosis not present

## 2015-11-23 DIAGNOSIS — R059 Cough, unspecified: Secondary | ICD-10-CM

## 2015-11-23 MED ORDER — BENZONATATE 200 MG PO CAPS: 200.0000 mg | ORAL_CAPSULE | Freq: Three times a day (TID) | ORAL | Status: DC | PRN

## 2015-11-23 NOTE — Progress Notes (Signed)
Pre visit review using our clinic review tool, if applicable. No additional management support is needed unless otherwise documented below in the visit note. 

## 2015-11-23 NOTE — Progress Notes (Signed)
Subjective:    Patient ID: [name], female    DOB: 1922/04/14, 81 y.o.   MRN: [id number]  HPI Here for a cough  Is non productive and mostly at night  Wonders if is from sinus drainage  Not junky sounding /have not heard her rattle  More of a dry hacky cough   Eating ok  No temp  Not acting sick at all  On protonix for gerd with no missed doses   Has not noticed a lot of runny nose or sniffling   No sob either   Sister is sick with a cold   Patient Active Problem List   Diagnosis Date Noted  . Herpes zoster 07/27/2015  . Abnormal TSH 06/26/2015  . Routine general medical examination at a health care facility 06/26/2015  . Gait disorder 07/29/2014  . Mobility impaired 07/25/2014  . Encounter for Medicare annual wellness exam 06/24/2014  . Venous insufficiency 08/17/2012  . Syncope 08/08/2012  . Dementia 08/08/2012  . Abnormal EKG 08/08/2012  . Essential hypertension, benign 12/22/2009  . Osteoporosis 09/24/2007  . GERD 07/16/2007  . Diabetes type 2, controlled ([place]) 07/13/2007  . Hyperlipidemia 07/13/2007  . OVERACTIVE BLADDER 07/13/2007  . DEGENERATIVE JOINT DISEASE 07/13/2007  . INCONTINENCE, URGE 07/13/2007   Past Medical History  Diagnosis Date  . Depression     type 2  . Hyperlipidemia   . Osteopenia   . Memory loss, short term 3/09    mild cognitive impairment  . Abnormal CT of the chest 10/08    5 mm nodule- needs f/u CT in 1 year  . Squamous cell carcinoma of leg   . DM type 2 (diabetes mellitus, type 2) ([place])   . Dysrhythmia     "irregular at times"  . GERD (gastroesophageal reflux disease)   . Alzheimer disease     "severe" (08/08/2012)   Past Surgical History  Procedure Laterality Date  . Total hip arthroplasty  ? first OR; 02/2005    right X 2  . Ischemic colitis  10/98    colonoscopy  . Squamous cell carcinoma excision  10/2001    on leg  . Pelvic fracture surgery  07/2002    after a fall  . Cataract extraction, bilateral   2010  . Dexa  11/01    osteopenia  . Vaginal hysterectomy  1972  . Bladder suspension  02/2001    pelvic vaginal sling for incontinence   Social History  Substance Use Topics  . Smoking status: Never Smoker   . Smokeless tobacco: Never Used  . Alcohol Use: No   Family History  Problem Relation Age of Onset  . Stroke Mother   . Dementia Mother   . Stroke Father   . Breast cancer Sister   . Prostate cancer Brother   . Breast cancer Sister   . Memory loss Sister   . Breast cancer Other     neice  . Alzheimer's disease Sister     older sister  . Memory loss Sister     mild   Allergies  Allergen Reactions  . Alendronate Sodium     REACTION: GI; "don't really remember reaction"  . Risedronate Sodium     REACTION: GI; "don't really remember reaction"   Current Outpatient Prescriptions on File Prior to Visit  Medication Sig Dispense Refill  . aspirin 81 MG tablet Take 81 mg by mouth daily.    Marland Kitchen atorvastatin (LIPITOR) 10 MG tablet TAKE ONE TABLET BY  MOUTH EVERY NIGHT AT BEDTIME 90 tablet 3  . calcitonin, salmon, (MIACALCIN/FORTICAL) 200 UNIT/ACT nasal spray USE ONE SPRAY IN EACH NOSTRIL DAILY *ALTERNATING NOSTRILS 11.1 mL 2  . donepezil (ARICEPT) 5 MG tablet Take 1 tablet (5 mg total) by mouth at bedtime. 90 tablet 3  . glucose blood test strip Use to check blood sugar daily and as directed. Dx 250.00 100 each 6  . memantine (NAMENDA XR) 28 MG CP24 24 hr capsule Take 1 capsule (28 mg total) by mouth daily. 90 capsule 3  . metFORMIN (GLUCOPHAGE) 500 MG tablet TAKE 1 TABLET BY MOUTH TWICE A DAY WITH MEALS 180 tablet 3  . pantoprazole (PROTONIX) 40 MG tablet TAKE ONE TABLET BY MOUTH EVERY MORNING 90 tablet 1   No current facility-administered medications on file prior to visit.     Review of Systems Review of Systems  Constitutional: Negative for fever, appetite change, fatigue and unexpected weight change.  ENT pos for mild rhinorrhea and post nasal drip /throat clearing (neg  for ST) Eyes: Negative for pain and visual disturbance.  Respiratory: Negative for wheeze and shortness of breath.   Cardiovascular: Negative for cp or palpitations    Gastrointestinal: Negative for nausea, diarrhea and constipation.  Genitourinary: Negative for urgency and frequency.  Skin: Negative for pallor or rash   Neurological: Negative for weakness, light-headedness, numbness and headaches.  Hematological: Negative for adenopathy. Does not bruise/bleed easily.  Psychiatric/Behavioral: Negative for dysphoric mood. The patient is not nervous/anxious. Pos for dementia         Objective:   Physical Exam  Constitutional: She appears well-developed and well-nourished. No distress.  Calm/quiet sitting in wheelchair -no distress  HENT:  Head: Normocephalic and atraumatic.  Right Ear: External ear normal.  Left Ear: External ear normal.  Mouth/Throat: Oropharynx is clear and moist. No oropharyngeal exudate.  Nares are boggy Mild clear post nasal drip  No sinus tenderness   Throat-no erythema or exudate   Eyes: Conjunctivae and EOM are normal. Pupils are equal, round, and reactive to light. Right eye exhibits no discharge.  Neck: Normal range of motion. Neck supple.  Cardiovascular: Normal rate and regular rhythm.   Pulmonary/Chest: Effort normal and breath sounds normal. No respiratory distress. She has no wheezes. She has no rales. She exhibits no tenderness.  Musculoskeletal: She exhibits no edema.  Lymphadenopathy:    She has no cervical adenopathy.  Neurological: She is alert.  Skin: Skin is warm and dry. No rash noted. No pallor.  Psychiatric:  Baseline dementia Answers a few questions with yes or no          Assessment & Plan:   Problem List Items Addressed This Visit      Other   Cough - Primary    Pm only along with throat clearing  Going on for a while -no infx symptoms reasuring exam GERD has been well controlled and does not eat before bed   Trial of  claritin 10 mg daily  Also tessalon- if she can swallow whole prn at bedtime Update if not starting to improve in a week or if worsening

## 2015-11-23 NOTE — Patient Instructions (Signed)
Try claritin over the counter (store band is fine) 10 mg once daily for post nasal drip  This should help cough and throat clearing Watch for fever or other symptoms  If you are certain she will not bite a pill- can try tessalon at bedtime as well  It is important that this is not bitten - it will numb the mouth

## 2015-11-23 NOTE — Assessment & Plan Note (Signed)
Pm only along with throat clearing  Going on for a while -no infx symptoms reasuring exam GERD has been well controlled and does not eat before bed   Trial of claritin 10 mg daily  Also tessalon- if she can swallow whole prn at bedtime Update if not starting to improve in a week or if worsening

## 2015-12-02 ENCOUNTER — Ambulatory Visit (INDEPENDENT_AMBULATORY_CARE_PROVIDER_SITE_OTHER): Payer: Medicare Other | Admitting: Neurology

## 2015-12-02 ENCOUNTER — Encounter: Payer: Self-pay | Admitting: Neurology

## 2015-12-02 VITALS — BP 130/62 | HR 76 | Resp 16

## 2015-12-02 DIAGNOSIS — F0390 Unspecified dementia without behavioral disturbance: Secondary | ICD-10-CM | POA: Diagnosis not present

## 2015-12-02 NOTE — Patient Instructions (Signed)
I suggest we continue with the current medications for your memory.

## 2015-12-02 NOTE — Progress Notes (Signed)
Subjective:    Patient ID: [name] is a 81 y.o. female.  HPI     Interim history:   [name] is a very pleasant 81 year old right-handed woman who presents for followup consultation of her advanced memory loss without behavioral changes. She is accompanied by her sister, [name], and her caretaker, [name], again today. I last saw her on 06/04/2015, at which time her sister and caretaker felt that she was stable. She was more deconditioned overall and had gotten weaker however. She was not walking a whole lot.some of her other caretakers were able to walker with her walker son. She was using a 2 wheeled walker at home. She had a full-time caretaker nearly Latta. Between 2 PM and 5 PM the patient and her sister are by themselves. The overnight staff comes at 5 PM and stays through 8 AM the next day and this is through private caretakers. Her appetite was good. She was offered fluids throughout the day. They were also utilizing Pedialyte. There was no recent urinary tract infection and thankfully she had not fallen recently. Sometimes she would get irritable but no significant behavioral problems were noted. She is usually very mellow and cooperative. Her MMSE was 13/30, CDT: 0/4, AFT: 0/min. I suggested we continue with Aricept generic 5 mg once daily and Namenda long-acting 28 mg once daily.  Today, 12/02/2015: Her sister feels that things are stable, sadly, they lost their last sister (they were a total of 8 siblings) on 11/10/15. The patient had a recent cold. Appetite is good, but takes her 1-2 hours to eat, will finish food and drink, when given something to drink. She had shingles, but not full breakout and had the shingles shot before. She mostly stays in her wheelchair. There have been no problems with her mood or behavior.   Previously:   I saw her on 12/04/2014, at which time her family reported that she had been stable. She had a recent UTI for which she was treated. She had an  episode of unresponsiveness for which they had to call EMS. Her blood sugar and blood pressure were fine. There was no report of seizure activity. She came back to baseline and was not taken to the emergency room. At the last visit we mutually agreed to continue with her Namenda long-acting and low-dose Aricept. Her MMSE was 10.   I saw her on 06/03/2014, at which time her family reported that she had a UTI in March 2015 and in January 2015 she was taken to the ER via EMS secondary to altered mental status and she was found to have a low blood sugar as well as a urinary tract infection. Her 35 year old brother died in 2013-12-01. I suggested we continue with Aricept 5 mg and long-acting Namenda 28 mg daily.     I saw her on 11/28/2013, at which time they reported that 5 out of a total 8 total siblings have/had AD, some with behavioral issues. I kept her on Namenda XR 20 mg daily and Aricept 5 mg daily. She was not able to tolerate Aricept at 10 mg daily, because of syncope and history of bradycardia.   She previously saw Dr. Erling Cruz and was last seen by him on 09/21/2012 at which time he felt that she had improved on Namenda. He decreased her Aricept because of a history of syncope.   She lives with her two sisters and was first seen by Dr. Erling Cruz in 2009 with memory loss beginning in  2008 or before. She has short-term episodic memory loss beginning with conversations and in taking her medications She also has procedural memory loss, not knowing how to use the TV remote. 2 other sisters and her brother have dementia. Initial MMSE was 27/30, CDT 4/4, then on 05/06/2009 MMSE 28/30, CDT 4/4, AFT 8. On 09/23/2009 = MMSE 28/30, CDT 4/4. On 10/14/10 = MMSE 26/30, CDT 4/4, AFT 12. Examination was remarkable for severe kyphosis and peripheral neuropathy. Workup for treatable causes of memory loss was negative including MRI study of the brain, TSH, B12, sedimentation rate, folate, and CBC. She is sleeping well. She no  longer dresses herself, or takes care of her toilet needs. She feeds herself. They have a CNA 24/7. She has been accepted for long-term care insurance benefits. She denies depression, and there is no report of halluciation, delusions, outbursts or agitation. She does not exercise. She reads and enjoys television but is reading less than she used to. She does not cook or clean. She makes her bed. She attends church women activities and senior citizens group activities and a "friend's and Therapist, art. She has bladder incontinence but no bowel incontinence. She uses depends. She does not fall, but her gait is very unsteady. Falls assessment tool score is 17. She does not have bizarre dreams. On 10/19/11 = MMSE 26/30, CDT 4/4, AFT 8, Geriatric depression scale was 2/15.   04/12/2012 = MMSE 15/30, CDT 3/4, AFT 6. The patient passed out on 8/21/213 at the breakfast table without tonic-clonic activity. She was seen at [facility] and thought to be dehydrated with elevated WBC and urinary tract infection. She passed out again on 09/16/2012 at church in the sitting position without tonic-clonic activity or urinary incontinence.She had no warning that she was going to pass out with either incident. On 09/21/2012 = MMSE 22/30, CDT 4/4, AFT 7.   Her Past Medical History Is Significant For: Past Medical History  Diagnosis Date  . Depression     type 2  . Hyperlipidemia   . Osteopenia   . Memory loss, short term 3/09    mild cognitive impairment  . Abnormal CT of the chest 10/08    5 mm nodule- needs f/u CT in 1 year  . Squamous cell carcinoma of leg   . DM type 2 (diabetes mellitus, type 2) ([place])   . Dysrhythmia     "irregular at times"  . GERD (gastroesophageal reflux disease)   . Alzheimer disease     "severe" (08/08/2012)    Her Past Surgical History Is Significant For: Past Surgical History  Procedure Laterality Date  . Total hip arthroplasty  ? first OR; 02/2005    right X 2  . Ischemic  colitis  10/98    colonoscopy  . Squamous cell carcinoma excision  10/2001    on leg  . Pelvic fracture surgery  07/2002    after a fall  . Cataract extraction, bilateral  2010  . Dexa  11/01    osteopenia  . Vaginal hysterectomy  1972  . Bladder suspension  02/2001    pelvic vaginal sling for incontinence    Her Family History Is Significant For: Family History  Problem Relation Age of Onset  . Stroke Mother   . Dementia Mother   . Stroke Father   . Breast cancer Sister   . Prostate cancer Brother   . Breast cancer Sister   . Memory loss Sister   . Breast cancer Other  neice  . Alzheimer's disease Sister     older sister  . Memory loss Sister     mild    Her Social History Is Significant For: Social History   Social History  . Marital Status: Single    Spouse Name: N/A  . Number of Children: 0  . Years of Education: college   Occupational History  . Retired Education officer, museum    Social History Main Topics  . Smoking status: Never Smoker   . Smokeless tobacco: Never Used  . Alcohol Use: No  . Drug Use: No  . Sexual Activity: Not Asked   Other Topics Concern  . None   Social History Narrative   Lives with her sister [name]) who helps care for her (and another sister).   Involved in church activities.    Her Allergies Are:  Allergies  Allergen Reactions  . Alendronate Sodium     REACTION: GI; "don't really remember reaction"  . Risedronate Sodium     REACTION: GI; "don't really remember reaction"  :   His Current Medications Are:  Outpatient Encounter Prescriptions as of 12/02/2015  Medication Sig  . aspirin 81 MG tablet Take 81 mg by mouth daily.  Marland Kitchen atorvastatin (LIPITOR) 10 MG tablet TAKE ONE TABLET BY MOUTH EVERY NIGHT AT BEDTIME  . calcitonin, salmon, (MIACALCIN/FORTICAL) 200 UNIT/ACT nasal spray USE ONE SPRAY IN EACH NOSTRIL DAILY *ALTERNATING NOSTRILS  . donepezil (ARICEPT) 5 MG tablet Take 1 tablet (5 mg total) by mouth at bedtime.   Marland Kitchen glucose blood test strip Use to check blood sugar daily and as directed. Dx 250.00  . memantine (NAMENDA XR) 28 MG CP24 24 hr capsule Take 1 capsule (28 mg total) by mouth daily.  . metFORMIN (GLUCOPHAGE) 500 MG tablet TAKE 1 TABLET BY MOUTH TWICE A DAY WITH MEALS  . pantoprazole (PROTONIX) 40 MG tablet TAKE ONE TABLET BY MOUTH EVERY MORNING  . [DISCONTINUED] benzonatate (TESSALON) 200 MG capsule Take 1 capsule (200 mg total) by mouth 3 (three) times daily as needed for cough (swallow whole, do not bite pill).   No facility-administered encounter medications on file as of 12/02/2015.  :  Review of Systems:  Out of a complete 14 point review of systems, all are reviewed and negative with the exception of these symptoms as listed below:   Review of Systems  Neurological:       Patient is here for f/u. Sister would like to go over her medications and discuss if she should continue taking them.  Sister reports that patient takes about 1-2 hours to eat.     Objective:  Neurologic Exam  Physical Exam Physical Examination:   Filed Vitals:   12/02/15 1145  BP: 130/62  Pulse: 76  Resp: 16   General Examination: The patient is a very pleasant 81 y.o. female in no acute distress. She is in her WC. She is minimally verbal. She appears more perky today, but intermittently somewhat sleepy and dozes off at times, but is easily arousable and responsive and more interactive.   HEENT: Normocephalic, atraumatic, pupils are equal, round and reactive to light and accommodation. Extraocular tracking is fair without nystagmus noted. Hearing is grossly intact. Face is symmetric with normal facial animation and normal facial sensation. Speech is scant, not spontaneous, but clear with no dysarthria noted. There is mild hypophonia. There is no lip, neck or jaw tremor. Neck is supple with full range of motion. Oropharynx exam reveals mild dryness. No significant airway  crowding is noted. Mallampati is class  II. Tongue protrudes centrally and palate elevates symmetrically.    Chest: is clear to auscultation without wheezing, rhonchi or crackles noted.  Heart: sounds are for the most part regular with a split second heart sound and extra heartbeats noted with pauses, not much changed.   Abdomen: is soft, non-tender and non-distended with normal bowel sounds appreciated on auscultation.  Extremities: There is no pitting edema in the distal lower extremities bilaterally. Pedal pulses are intact.  Skin: is warm and dry with no trophic changes noted.  Musculoskeletal: exam reveals no obvious joint deformities, tenderness or joint swelling or erythema.  Neurologically:  Mental status: The patient is awake, but does not pay much attention. She is calm and cooperative with the exam. She is oriented to self. Her memory, recall, attention, language and knowledge are significantly impaired. There is slowness in thinking. Speech is scarce and low volume, but clear. She speaks in 1 word sentences.   On 06/03/2014: Her MMSE is 5/30, clock drawing is 0/4, animal fluency is 2/minute.   On 12/04/2014: MMSE: 10/30, CDT: 1/4, AFT: 5/min. GDS is 3/15.  On 06/04/2015: MMSE: 13/30, CDT: 0/4, AFT: 0/min.   On 12/02/2015: MMSE: 17/30, CDT: 1.5/4, AFT: 5/min.   Affect is normal. Cranial nerves are as described above under HEENT exam. In addition, shoulder shrug is normal with equal shoulder height noted. Motor exam: thin bulk,  mildly reduced global strength is noted, tone seems normal. There is no drift, tremor or rebound. Romberg is not testable. Reflexes are 2+ throughout. Fine motor skills are mildly impaired.   Cerebellar testing shows no dysmetria or intention tremor. There is no truncal or gait ataxia.  Sensory exam is intact to light touch throughout.   Gait, station and balance: I did not have her stand or walk for me today as she did not bring her walker.  Assessment and Plan:   In summary, LATAIJA BLIGEN is a very pleasant 81 year old female with a history of advanced Alzheimers disease without behavioral disturbance. Her MMSE score is actually better than 6 months ago. There has been clinical decline with time. She is able to tolerate Aricept generic 5 mg daily and Namenda long-acting 28 mg daily. I suggested we continue with her medications. I reminded her sister and her caretaker to push for oral fluid intake and gentle reminding of food intake. It may help to reheat it, when she has been added for over half an hour. She has a strong family history of memory loss in several of her siblings. They were 8 of them, now only Marsha and her sister [name]. We mutually agreed to keep her on Aricept 5 mg and Namenda long-acting 28 mg daily.  she did not need a refill today. I suggested a six-month checkup, sooner if needed. I answered all their questions today and they were in agreement. I encouraged them to call with any interim questions, concerns, problems or updates and refill requests.  I spent 20 minutes in total face-to-face time with the patient, more than 50% of which was spent in counseling and coordination of care, reviewing test results, reviewing medication and discussing or reviewing the diagnosis of dementia, its prognosis and treatment options.

## 2015-12-27 ENCOUNTER — Telehealth: Payer: Self-pay | Admitting: Family Medicine

## 2015-12-27 DIAGNOSIS — E119 Type 2 diabetes mellitus without complications: Secondary | ICD-10-CM

## 2015-12-27 DIAGNOSIS — I10 Essential (primary) hypertension: Secondary | ICD-10-CM

## 2015-12-27 DIAGNOSIS — E785 Hyperlipidemia, unspecified: Secondary | ICD-10-CM

## 2015-12-27 NOTE — Telephone Encounter (Signed)
-----   Message from Marchia Bond sent at 12/22/2015  9:10 AM EST ----- Regarding: 6 mo f/u labs Mon 1/9, need orders. Thanks! :-) Please order future 6 mo f/u labs for pt's upcoming lab appt. Thanks Aniceto Boss

## 2015-12-28 ENCOUNTER — Other Ambulatory Visit: Payer: Medicare Other

## 2015-12-30 ENCOUNTER — Other Ambulatory Visit (INDEPENDENT_AMBULATORY_CARE_PROVIDER_SITE_OTHER): Payer: Medicare Other

## 2015-12-30 DIAGNOSIS — E119 Type 2 diabetes mellitus without complications: Secondary | ICD-10-CM | POA: Diagnosis not present

## 2015-12-30 DIAGNOSIS — E785 Hyperlipidemia, unspecified: Secondary | ICD-10-CM | POA: Diagnosis not present

## 2015-12-30 DIAGNOSIS — I10 Essential (primary) hypertension: Secondary | ICD-10-CM | POA: Diagnosis not present

## 2015-12-30 LAB — COMPREHENSIVE METABOLIC PANEL
ALBUMIN: 3.5 g/dL (ref 3.5–5.2)
ALK PHOS: 82 U/L (ref 39–117)
ALT: 7 U/L (ref 0–35)
AST: 15 U/L (ref 0–37)
BUN: 19 mg/dL (ref 6–23)
CALCIUM: 9.3 mg/dL (ref 8.4–10.5)
CHLORIDE: 102 meq/L (ref 96–112)
CO2: 28 mEq/L (ref 19–32)
CREATININE: 0.96 mg/dL (ref 0.40–1.20)
GFR: 57.86 mL/min — ABNORMAL LOW (ref >60.00)
Glucose, Bld: 94 mg/dL (ref 70–99)
Potassium: 4.6 mEq/L (ref 3.5–5.1)
SODIUM: 137 meq/L (ref 135–145)
TOTAL PROTEIN: 6.1 g/dL (ref 6.0–8.3)
Total Bilirubin: 0.5 mg/dL (ref 0.2–1.2)

## 2015-12-30 LAB — LIPID PANEL
CHOLESTEROL: 127 mg/dL (ref 0–200)
HDL: 49.5 mg/dL (ref >39.00)
LDL Cholesterol: 64 mg/dL (ref 0–99)
NONHDL: 77.56
Total CHOL/HDL Ratio: 3
Triglycerides: 67 mg/dL (ref 0.0–149.0)
VLDL: 13.4 mg/dL (ref 0.0–40.0)

## 2015-12-30 LAB — HEMOGLOBIN A1C: HEMOGLOBIN A1C: 6.8 % — AB (ref 4.6–6.5)

## 2016-01-01 ENCOUNTER — Encounter: Payer: Self-pay | Admitting: Family Medicine

## 2016-01-01 ENCOUNTER — Ambulatory Visit (INDEPENDENT_AMBULATORY_CARE_PROVIDER_SITE_OTHER): Payer: Medicare Other | Admitting: Family Medicine

## 2016-01-01 VITALS — BP 128/70 | HR 88 | Temp 97.5°F | Wt 130.5 lb

## 2016-01-01 DIAGNOSIS — E785 Hyperlipidemia, unspecified: Secondary | ICD-10-CM

## 2016-01-01 DIAGNOSIS — I10 Essential (primary) hypertension: Secondary | ICD-10-CM

## 2016-01-01 DIAGNOSIS — E119 Type 2 diabetes mellitus without complications: Secondary | ICD-10-CM | POA: Diagnosis not present

## 2016-01-01 DIAGNOSIS — R05 Cough: Secondary | ICD-10-CM | POA: Diagnosis not present

## 2016-01-01 DIAGNOSIS — R059 Cough, unspecified: Secondary | ICD-10-CM

## 2016-01-01 NOTE — Progress Notes (Signed)
Pre visit review using our clinic review tool, if applicable. No additional management support is needed unless otherwise documented below in the visit note. 

## 2016-01-01 NOTE — Patient Instructions (Signed)
Labs are stable  claritin is ok for cough and runny nose as needed if it helps  Stay as active and engaged as you can be  Follow up in 6 months for annual exam with lab prior

## 2016-01-01 NOTE — Progress Notes (Signed)
Subjective:    Patient ID: [name], female    DOB: 04-19-1922, 82 y.o.   MRN: [id number]  HPI Here for f/u of chronic health problems and also cough  Wt is up 4 lb with bmi of 25 Usually weights 125 lb first thing in the am  Appetite is good overall- just takes a long time for her to eat   bp is stable today  No cp or palpitations or headaches or edema  occ runs high at home-then better on re check  No side effects to medicines  BP Readings from Last 3 Encounters:  01/01/16 128/70  12/02/15 130/62  11/23/15 138/64       Chemistry      Component Value Date/Time   NA 137 12/30/2015 0918   K 4.6 12/30/2015 0918   CL 102 12/30/2015 0918   CO2 28 12/30/2015 0918   BUN 19 12/30/2015 0918   CREATININE 0.96 12/30/2015 0918      Component Value Date/Time   CALCIUM 9.3 12/30/2015 0918   ALKPHOS 82 12/30/2015 0918   AST 15 12/30/2015 0918   ALT 7 12/30/2015 0918   BILITOT 0.5 12/30/2015 0918       DM2 Lab Results  Component Value Date   HGBA1C 6.8* 12/30/2015   This is up from 6.6 last time  ? Due to holiday eating    Cholesterol Lab Results  Component Value Date   CHOL 127 12/30/2015   CHOL 137 06/19/2015   CHOL 145 06/18/2014   Lab Results  Component Value Date   HDL 49.50 12/30/2015   HDL 49.40 06/19/2015   HDL 63.30 06/18/2014   Lab Results  Component Value Date   LDLCALC 64 12/30/2015   LDLCALC 71 06/19/2015   LDLCALC 68 06/18/2014   Lab Results  Component Value Date   TRIG 67.0 12/30/2015   TRIG 82.0 06/19/2015   TRIG 67.0 06/18/2014   Lab Results  Component Value Date   CHOLHDL 3 12/30/2015   CHOLHDL 3 06/19/2015   CHOLHDL 2 06/18/2014   No results found for: LDLDIRECT  lipitor and diet  Low fat diet   Still has a little dry cough  Tried claritin daily -not helping much  Still hears her coughing some - day and night  Not really bothering her much Nose runs a bit  ? If from the dry head  No c/o heartburn -on acid reflux med    No fever or sob   Sleeps a lot   Is being followed for dementia-sees neurol  Patient Active Problem List   Diagnosis Date Noted  . Cough 11/23/2015  . Herpes zoster 07/27/2015  . Abnormal TSH 06/26/2015  . Routine general medical examination at a health care facility 06/26/2015  . Gait disorder 07/29/2014  . Mobility impaired 07/25/2014  . Encounter for Medicare annual wellness exam 06/24/2014  . Venous insufficiency 08/17/2012  . Syncope 08/08/2012  . Dementia 08/08/2012  . Abnormal EKG 08/08/2012  . Essential hypertension, benign 12/22/2009  . Osteoporosis 09/24/2007  . GERD 07/16/2007  . Diabetes type 2, controlled ([place]) 07/13/2007  . Hyperlipidemia 07/13/2007  . OVERACTIVE BLADDER 07/13/2007  . DEGENERATIVE JOINT DISEASE 07/13/2007  . INCONTINENCE, URGE 07/13/2007   Past Medical History  Diagnosis Date  . Depression     type 2  . Hyperlipidemia   . Osteopenia   . Memory loss, short term 3/09    mild cognitive impairment  . Abnormal CT of the chest 10/08  5 mm nodule- needs f/u CT in 1 year  . Squamous cell carcinoma of leg   . DM type 2 (diabetes mellitus, type 2) ([place])   . Dysrhythmia     "irregular at times"  . GERD (gastroesophageal reflux disease)   . Alzheimer disease     "severe" (08/08/2012)   Past Surgical History  Procedure Laterality Date  . Total hip arthroplasty  ? first OR; 02/2005    right X 2  . Ischemic colitis  10/98    colonoscopy  . Squamous cell carcinoma excision  10/2001    on leg  . Pelvic fracture surgery  07/2002    after a fall  . Cataract extraction, bilateral  2010  . Dexa  11/01    osteopenia  . Vaginal hysterectomy  1972  . Bladder suspension  02/2001    pelvic vaginal sling for incontinence   Social History  Substance Use Topics  . Smoking status: Never Smoker   . Smokeless tobacco: Never Used  . Alcohol Use: No   Family History  Problem Relation Age of Onset  . Stroke Mother   . Dementia Mother   . Stroke  Father   . Breast cancer Sister   . Prostate cancer Brother   . Breast cancer Sister   . Memory loss Sister   . Breast cancer Other     neice  . Alzheimer's disease Sister     older sister  . Memory loss Sister     mild   Allergies  Allergen Reactions  . Alendronate Sodium     REACTION: GI; "don't really remember reaction"  . Risedronate Sodium     REACTION: GI; "don't really remember reaction"   Current Outpatient Prescriptions on File Prior to Visit  Medication Sig Dispense Refill  . aspirin 81 MG tablet Take 81 mg by mouth daily.    Marland Kitchen atorvastatin (LIPITOR) 10 MG tablet TAKE ONE TABLET BY MOUTH EVERY NIGHT AT BEDTIME 90 tablet 3  . calcitonin, salmon, (MIACALCIN/FORTICAL) 200 UNIT/ACT nasal spray USE ONE SPRAY IN EACH NOSTRIL DAILY *ALTERNATING NOSTRILS 11.1 mL 2  . donepezil (ARICEPT) 5 MG tablet Take 1 tablet (5 mg total) by mouth at bedtime. 90 tablet 3  . glucose blood test strip Use to check blood sugar daily and as directed. Dx 250.00 100 each 6  . memantine (NAMENDA XR) 28 MG CP24 24 hr capsule Take 1 capsule (28 mg total) by mouth daily. 90 capsule 3  . metFORMIN (GLUCOPHAGE) 500 MG tablet TAKE 1 TABLET BY MOUTH TWICE A DAY WITH MEALS 180 tablet 3  . pantoprazole (PROTONIX) 40 MG tablet TAKE ONE TABLET BY MOUTH EVERY MORNING 90 tablet 1   No current facility-administered medications on file prior to visit.      Review of Systems Review of Systems  Constitutional: Negative for fever, appetite change, fatigue and unexpected weight change.  Eyes: Negative for pain and visual disturbance.  Respiratory: Negative for wheeze and shortness of breath.  pos for dry cough Cardiovascular: Negative for cp or palpitations    Gastrointestinal: Negative for nausea, diarrhea and constipation.  Genitourinary: Negative for urgency and frequency.  Skin: Negative for pallor or rash   Neurological: Negative for weakness, light-headedness, numbness and headaches.  Hematological:  Negative for adenopathy. Does not bruise/bleed easily.  Psychiatric/Behavioral: Negative for dysphoric mood. The patient is not nervous/anxious.  pos for dementia that is worsening        Objective:   Physical Exam  Constitutional:  She appears well-developed and well-nourished. No distress.  Frail appearing elderly female with dementia sitting in wheel chair   HENT:  Head: Normocephalic and atraumatic.  Mouth/Throat: Oropharynx is clear and moist.  Clear post nasal drip  Boggy nares  Pt clears her throat frequently  Eyes: Conjunctivae and EOM are normal. Pupils are equal, round, and reactive to light.  Neck: Normal range of motion. Neck supple. No JVD present. Carotid bruit is not present. No thyromegaly present.  Cardiovascular: Normal rate, regular rhythm, normal heart sounds and intact distal pulses.  Exam reveals no gallop.   Pulmonary/Chest: Effort normal and breath sounds normal. No respiratory distress. She has no wheezes. She has no rales. She exhibits no tenderness.  No crackles  Abdominal: Soft. Bowel sounds are normal. She exhibits no distension, no abdominal bruit and no mass. There is no tenderness.  Musculoskeletal: She exhibits no edema.  Lymphadenopathy:    She has no cervical adenopathy.  Neurological: She is alert. She has normal reflexes.  Skin: Skin is warm and dry. No rash noted. No pallor.  Psychiatric: She has a normal mood and affect.          Assessment & Plan:   Problem List Items Addressed This Visit      Cardiovascular and Mediastinum   Essential hypertension, benign - Primary    bp in fair control at this time  BP Readings from Last 1 Encounters:  01/01/16 128/70   No changes needed Disc lifstyle change with low sodium diet and exercise  Labs reviewed         Endocrine   Diabetes type 2, controlled ([place])    Lab Results  Component Value Date   HGBA1C 6.8* 12/30/2015   Up a bit from holiday eating  No change in tx  Continue to  follow         Other   Cough    Likely due to post nasal drip / will watch for signs of gerd  Can try different antihistamine like zyrtec  Update if this worsens or if she develops viral symptoms  Reassuring exam today      Hyperlipidemia    Disc goals for lipids and reasons to control them Rev labs with pt Rev low sat fat diet in detail  Continue statin

## 2016-01-02 NOTE — Assessment & Plan Note (Signed)
Likely due to post nasal drip / will watch for signs of gerd  Can try different antihistamine like zyrtec  Update if this worsens or if she develops viral symptoms  Reassuring exam today

## 2016-01-02 NOTE — Assessment & Plan Note (Signed)
Disc goals for lipids and reasons to control them Rev labs with pt Rev low sat fat diet in detail Continue statin  

## 2016-01-02 NOTE — Assessment & Plan Note (Signed)
bp in fair control at this time  BP Readings from Last 1 Encounters:  01/01/16 128/70   No changes needed Disc lifstyle change with low sodium diet and exercise  Labs reviewed

## 2016-01-02 NOTE — Assessment & Plan Note (Signed)
Lab Results  Component Value Date   HGBA1C 6.8* 12/30/2015   Up a bit from holiday eating  No change in tx  Continue to follow

## 2016-01-06 ENCOUNTER — Ambulatory Visit: Payer: Medicare Other | Admitting: Podiatry

## 2016-01-13 ENCOUNTER — Encounter: Payer: Self-pay | Admitting: Podiatry

## 2016-01-13 ENCOUNTER — Ambulatory Visit (INDEPENDENT_AMBULATORY_CARE_PROVIDER_SITE_OTHER): Payer: Medicare Other | Admitting: Podiatry

## 2016-01-13 DIAGNOSIS — M79675 Pain in left toe(s): Secondary | ICD-10-CM

## 2016-01-13 DIAGNOSIS — B351 Tinea unguium: Secondary | ICD-10-CM

## 2016-01-13 DIAGNOSIS — M79674 Pain in right toe(s): Secondary | ICD-10-CM | POA: Diagnosis not present

## 2016-01-13 NOTE — Patient Instructions (Signed)
Diabetes and Foot Care Diabetes may cause you to have problems because of poor blood supply (circulation) to your feet and legs. This may cause the skin on your feet to become thinner, break easier, and heal more slowly. Your skin may become dry, and the skin may peel and crack. You may also have nerve damage in your legs and feet causing decreased feeling in them. You may not notice minor injuries to your feet that could lead to infections or more serious problems. Taking care of your feet is one of the most important things you can do for yourself.  HOME CARE INSTRUCTIONS  Wear shoes at all times, even in the house. Do not go barefoot. Bare feet are easily injured.  Check your feet daily for blisters, cuts, and redness. If you cannot see the bottom of your feet, use a mirror or ask someone for help.  Wash your feet with warm water (do not use hot water) and mild soap. Then pat your feet and the areas between your toes until they are completely dry. Do not soak your feet as this can dry your skin.  Apply a moisturizing lotion or petroleum jelly (that does not contain alcohol and is unscented) to the skin on your feet and to dry, brittle toenails. Do not apply lotion between your toes.  Trim your toenails straight across. Do not dig under them or around the cuticle. File the edges of your nails with an emery board or nail file.  Do not cut corns or calluses or try to remove them with medicine.  Wear clean socks or stockings every day. Make sure they are not too tight. Do not wear knee-high stockings since they may decrease blood flow to your legs.  Wear shoes that fit properly and have enough cushioning. To break in new shoes, wear them for just a few hours a day. This prevents you from injuring your feet. Always look in your shoes before you put them on to be sure there are no objects inside.  Do not cross your legs. This may decrease the blood flow to your feet.  If you find a minor scrape,  cut, or break in the skin on your feet, keep it and the skin around it clean and dry. These areas may be cleansed with mild soap and water. Do not cleanse the area with peroxide, alcohol, or iodine.  When you remove an adhesive bandage, be sure not to damage the skin around it.  If you have a wound, look at it several times a day to make sure it is healing.  Do not use heating pads or hot water bottles. They may burn your skin. If you have lost feeling in your feet or legs, you may not know it is happening until it is too late.  Make sure your health care provider performs a complete foot exam at least annually or more often if you have foot problems. Report any cuts, sores, or bruises to your health care provider immediately. SEEK MEDICAL CARE IF:   You have an injury that is not healing.  You have cuts or breaks in the skin.  You have an ingrown nail.  You notice redness on your legs or feet.  You feel burning or tingling in your legs or feet.  You have pain or cramps in your legs and feet.  Your legs or feet are numb.  Your feet always feel cold. SEEK IMMEDIATE MEDICAL CARE IF:   There is increasing redness,   swelling, or pain in or around a wound.  There is a red line that goes up your leg.  Pus is coming from a wound.  You develop a fever or as directed by your health care provider.  You notice a bad smell coming from an ulcer or wound.   This information is not intended to replace advice given to you by your health care provider. Make sure you discuss any questions you have with your health care provider.   Document Released: 12/02/2000 Document Revised: 08/07/2013 Document Reviewed: 05/14/2013 Elsevier Interactive Patient Education 2016 Elsevier Inc.  

## 2016-01-14 NOTE — Progress Notes (Signed)
Patient ID: [name], female   DOB: 31-Dec-1921, 82 y.o.   MRN: [id number]  Subjective: This patient presents for a scheduled visit with a complaint of thickened elongated toenails which are comfortable with direct shoe pressure and walking. Patient's sister and caregiver present to treatment room  Vascular: DP pulses 1/4 bilaterally PT pulses nonpalpable bilaterally Capillary reflex immediate bilaterally  Neurological: Ankle reflex weakly reactive bilaterally Patient not able to respond to 10 g monofilament wire vibratory sensation  Dermatological: Atrophic skin without any open wounds bilaterally The toenails are elongated, incurvated, deformed, hypertrophic and tender to direct palpation 6-10  Musculoskeletal: HAV deformity left Mallet toe second and third right  Assessment: Diabetic with decreased pedal pulses Symptomatic onychomycoses 6-10  Plan: Debridement of toenails 6-10 mechanically an electrical without any bleeding  Reappoint 4 months

## 2016-01-20 LAB — HM DIABETES EYE EXAM

## 2016-02-04 ENCOUNTER — Encounter: Payer: Self-pay | Admitting: Family Medicine

## 2016-04-05 ENCOUNTER — Other Ambulatory Visit: Payer: Self-pay | Admitting: Family Medicine

## 2016-05-09 ENCOUNTER — Encounter: Payer: Self-pay | Admitting: Family Medicine

## 2016-05-09 ENCOUNTER — Ambulatory Visit (INDEPENDENT_AMBULATORY_CARE_PROVIDER_SITE_OTHER): Payer: Medicare Other | Admitting: Family Medicine

## 2016-05-09 VITALS — BP 118/70 | HR 42 | Temp 98.0°F

## 2016-05-09 DIAGNOSIS — R05 Cough: Secondary | ICD-10-CM

## 2016-05-09 DIAGNOSIS — R059 Cough, unspecified: Secondary | ICD-10-CM

## 2016-05-09 NOTE — Progress Notes (Signed)
SUBJECTIVE:  [name] is a 82 y.o. female pt of Dr. Glori Bickers, new to me, who complains of coryza and dry cough for 2 days.  Sister is being treated for bronchitis.  She has advanced dementia but denies any other complaints.  .   Current Outpatient Prescriptions on File Prior to Visit  Medication Sig Dispense Refill  . aspirin 81 MG tablet Take 81 mg by mouth daily.    Marland Kitchen atorvastatin (LIPITOR) 10 MG tablet TAKE ONE TABLET BY MOUTH EVERY NIGHT AT BEDTIME 90 tablet 3  . calcitonin, salmon, (MIACALCIN/FORTICAL) 200 UNIT/ACT nasal spray USE ONE SPRAY IN EACH NOSTRIL DAILY *ALTERNATING NOSTRILS 11.1 mL 2  . donepezil (ARICEPT) 5 MG tablet Take 1 tablet (5 mg total) by mouth at bedtime. 90 tablet 3  . glucose blood test strip Use to check blood sugar daily and as directed. Dx 250.00 100 each 6  . memantine (NAMENDA XR) 28 MG CP24 24 hr capsule Take 1 capsule (28 mg total) by mouth daily. 90 capsule 3  . metFORMIN (GLUCOPHAGE) 500 MG tablet TAKE 1 TABLET BY MOUTH TWICE A DAY WITH MEALS 180 tablet 3  . pantoprazole (PROTONIX) 40 MG tablet TAKE ONE TABLET BY MOUTH EVERY MORNING 90 tablet 1   No current facility-administered medications on file prior to visit.    Allergies  Allergen Reactions  . Alendronate Sodium     REACTION: GI; "don't really remember reaction"  . Risedronate Sodium     REACTION: GI; "don't really remember reaction"    Past Medical History  Diagnosis Date  . Depression     type 2  . Hyperlipidemia   . Osteopenia   . Memory loss, short term 3/09    mild cognitive impairment  . Abnormal CT of the chest 10/08    5 mm nodule- needs f/u CT in 1 year  . Squamous cell carcinoma of leg   . DM type 2 (diabetes mellitus, type 2) ([place])   . Dysrhythmia     "irregular at times"  . GERD (gastroesophageal reflux disease)   . Alzheimer disease     "severe" (08/08/2012)    Past Surgical History  Procedure Laterality Date  . Total hip arthroplasty  ? first OR; 02/2005    right  X 2  . Ischemic colitis  10/98    colonoscopy  . Squamous cell carcinoma excision  10/2001    on leg  . Pelvic fracture surgery  07/2002    after a fall  . Cataract extraction, bilateral  2010  . Dexa  11/01    osteopenia  . Vaginal hysterectomy  1972  . Bladder suspension  02/2001    pelvic vaginal sling for incontinence    Family History  Problem Relation Age of Onset  . Stroke Mother   . Dementia Mother   . Stroke Father   . Breast cancer Sister   . Prostate cancer Brother   . Breast cancer Sister   . Memory loss Sister   . Breast cancer Other     neice  . Alzheimer's disease Sister     older sister  . Memory loss Sister     mild    Social History   Social History  . Marital Status: Single    Spouse Name: N/A  . Number of Children: 0  . Years of Education: college   Occupational History  . Retired Education officer, museum    Social History Main Topics  . Smoking status: Never Smoker   .  Smokeless tobacco: Never Used  . Alcohol Use: No  . Drug Use: No  . Sexual Activity: Not on file   Other Topics Concern  . Not on file   Social History Narrative   Lives with her sister Nesreen Philipps) who helps care for her (and another sister).   Involved in church activities.   The PMH, PSH, Social History, Family History, Medications, and allergies have been reviewed in [facility], and have been updated if relevant.  OBJECTIVE: BP 118/70 mmHg  Pulse 42  Temp(Src) 98 F (36.7 C) (Oral)  Wt   SpO2 92%  She appears well, vital signs are as noted. Ears normal.  Throat and pharynx normal.  Neck supple. No adenopathy in the neck. Nose is congested. Sinuses non tender. The chest is clear, without wheezes or rales.  ASSESSMENT:  viral upper respiratory illness  PLAN: Symptomatic therapy suggested: push fluids, rest and return office visit prn if symptoms persist or worsen. Lack of antibiotic effectiveness discussed with her. Call or return to clinic prn if these symptoms worsen or  fail to improve as anticipated.

## 2016-05-09 NOTE — Progress Notes (Signed)
Pre visit review using our clinic review tool, if applicable. No additional management support is needed unless otherwise documented below in the visit note. 

## 2016-05-11 ENCOUNTER — Encounter: Payer: Self-pay | Admitting: Podiatry

## 2016-05-11 ENCOUNTER — Ambulatory Visit (INDEPENDENT_AMBULATORY_CARE_PROVIDER_SITE_OTHER): Payer: Medicare Other | Admitting: Podiatry

## 2016-05-11 DIAGNOSIS — M79675 Pain in left toe(s): Secondary | ICD-10-CM | POA: Diagnosis not present

## 2016-05-11 DIAGNOSIS — M79674 Pain in right toe(s): Secondary | ICD-10-CM | POA: Diagnosis not present

## 2016-05-11 DIAGNOSIS — B351 Tinea unguium: Secondary | ICD-10-CM

## 2016-05-11 NOTE — Patient Instructions (Signed)
Diabetes and Foot Care Diabetes may cause you to have problems because of poor blood supply (circulation) to your feet and legs. This may cause the skin on your feet to become thinner, break easier, and heal more slowly. Your skin may become dry, and the skin may peel and crack. You may also have nerve damage in your legs and feet causing decreased feeling in them. You may not notice minor injuries to your feet that could lead to infections or more serious problems. Taking care of your feet is one of the most important things you can do for yourself.  HOME CARE INSTRUCTIONS  Wear shoes at all times, even in the house. Do not go barefoot. Bare feet are easily injured.  Check your feet daily for blisters, cuts, and redness. If you cannot see the bottom of your feet, use a mirror or ask someone for help.  Wash your feet with warm water (do not use hot water) and mild soap. Then pat your feet and the areas between your toes until they are completely dry. Do not soak your feet as this can dry your skin.  Apply a moisturizing lotion or petroleum jelly (that does not contain alcohol and is unscented) to the skin on your feet and to dry, brittle toenails. Do not apply lotion between your toes.  Trim your toenails straight across. Do not dig under them or around the cuticle. File the edges of your nails with an emery board or nail file.  Do not cut corns or calluses or try to remove them with medicine.  Wear clean socks or stockings every day. Make sure they are not too tight. Do not wear knee-high stockings since they may decrease blood flow to your legs.  Wear shoes that fit properly and have enough cushioning. To break in new shoes, wear them for just a few hours a day. This prevents you from injuring your feet. Always look in your shoes before you put them on to be sure there are no objects inside.  Do not cross your legs. This may decrease the blood flow to your feet.  If you find a minor scrape,  cut, or break in the skin on your feet, keep it and the skin around it clean and dry. These areas may be cleansed with mild soap and water. Do not cleanse the area with peroxide, alcohol, or iodine.  When you remove an adhesive bandage, be sure not to damage the skin around it.  If you have a wound, look at it several times a day to make sure it is healing.  Do not use heating pads or hot water bottles. They may burn your skin. If you have lost feeling in your feet or legs, you may not know it is happening until it is too late.  Make sure your health care provider performs a complete foot exam at least annually or more often if you have foot problems. Report any cuts, sores, or bruises to your health care provider immediately. SEEK MEDICAL CARE IF:   You have an injury that is not healing.  You have cuts or breaks in the skin.  You have an ingrown nail.  You notice redness on your legs or feet.  You feel burning or tingling in your legs or feet.  You have pain or cramps in your legs and feet.  Your legs or feet are numb.  Your feet always feel cold. SEEK IMMEDIATE MEDICAL CARE IF:   There is increasing redness,   swelling, or pain in or around a wound.  There is a red line that goes up your leg.  Pus is coming from a wound.  You develop a fever or as directed by your health care provider.  You notice a bad smell coming from an ulcer or wound.   This information is not intended to replace advice given to you by your health care provider. Make sure you discuss any questions you have with your health care provider.   Document Released: 12/02/2000 Document Revised: 08/07/2013 Document Reviewed: 05/14/2013 Elsevier Interactive Patient Education 2016 Elsevier Inc.  

## 2016-05-12 NOTE — Progress Notes (Signed)
Patient ID: [name], female   DOB: 14-May-1922, 82 y.o.   MRN: [id number]  Subjective: This patient presents for a scheduled visit with a complaint of thickened elongated toenails which are comfortable with direct shoe pressure and walking. Patient's sister and caregiver present to treatment room  Vascular: DP pulses 1/4 bilaterally PT pulses nonpalpable bilaterally Capillary reflex immediate bilaterally  Neurological: Ankle reflex weakly reactive bilaterally Patient not able to respond to 10 g monofilament wire vibratory sensation  Dermatological: Atrophic skin without any open wounds bilaterally The toenails are elongated, incurvated, deformed, hypertrophic and tender to direct palpation 6-10  Musculoskeletal: HAV deformity left Mallet toe second and third right  Assessment: Diabetic with decreased pedal pulses Symptomatic onychomycoses 6-10  Plan: Debridement of toenails 6-10 mechanically an electrical without any bleeding  Reappoint 4 months

## 2016-06-01 ENCOUNTER — Ambulatory Visit (INDEPENDENT_AMBULATORY_CARE_PROVIDER_SITE_OTHER): Payer: Medicare Other | Admitting: Neurology

## 2016-06-01 ENCOUNTER — Encounter: Payer: Self-pay | Admitting: Neurology

## 2016-06-01 VITALS — BP 142/66 | HR 80 | Resp 16

## 2016-06-01 DIAGNOSIS — F0390 Unspecified dementia without behavioral disturbance: Secondary | ICD-10-CM | POA: Diagnosis not present

## 2016-06-01 MED ORDER — MEMANTINE HCL ER 28 MG PO CP24: 28.0000 mg | ORAL_CAPSULE | Freq: Every day | ORAL | Status: AC

## 2016-06-01 MED ORDER — DONEPEZIL HCL 5 MG PO TABS: 5.0000 mg | ORAL_TABLET | Freq: Every day | ORAL | Status: AC

## 2016-06-01 NOTE — Progress Notes (Signed)
Subjective:    Patient ID: [name] is a 82 y.o. female.  HPI     Interim history:   [name] is a very pleasant 82 year old right-handed woman who presents for followup consultation of her advanced memory loss without behavioral changes. She is accompanied by her sister, [name], and her caretaker, [name], again today. I last saw her on 12/02/2015 at which time her sister reported that things were stable. Sadly, another sister had died on 11-22-2015 (had been at [facility]). They were total of 8 siblings. The patient had no recent behavioral issues. She had a bout of shingles but not full breakout. She was staying in her wheelchair for the most part. She was able to tolerate Aricept generic 5 mg daily and Namenda long-acting 28 mg daily, MMSE was 17/30, CDT: 1.5/4, AFT: 5/min. I suggested we continue with her medication regimen.  Today, 06/01/2016: she reports feeling good. Sister has no new concerns, [name] reports a good appetite, pushes fluids. Has a couple of falls, no serious injuries, toppled out of recliner.  Previously:    I saw her on 06/04/2015, at which time her sister and caretaker felt that she was stable. She was more deconditioned overall and had gotten weaker however. She was not walking a whole lot.some of her other caretakers were able to walker with her walker son. She was using a 2 wheeled walker at home. She had a full-time caretaker nearly [place]. Between 2 PM and 5 PM the patient and her sister are by themselves. The overnight staff comes at 5 PM and stays through 8 AM the next day and this is through private caretakers. Her appetite was good. She was offered fluids throughout the day. They were also utilizing Pedialyte. There was no recent urinary tract infection and thankfully she had not fallen recently. Sometimes she would get irritable but no significant behavioral problems were noted. She is usually very mellow and cooperative. Her MMSE was 13/30, CDT: 0/4, AFT:  0/min. I suggested we continue with Aricept generic 5 mg once daily and Namenda long-acting 28 mg once daily.  I saw her on 12/04/2014, at which time her family reported that she had been stable. She had a recent UTI for which she was treated. She had an episode of unresponsiveness for which they had to call EMS. Her blood sugar and blood pressure were fine. There was no report of seizure activity. She came back to baseline and was not taken to the emergency room. At the last visit we mutually agreed to continue with her Namenda long-acting and low-dose Aricept. Her MMSE was 10.   I saw her on 06/03/2014, at which time her family reported that she had a UTI in March 2015 and in January 2015 she was taken to the ER via EMS secondary to altered mental status and she was found to have a low blood sugar as well as a urinary tract infection. Her 74 year old brother died in 11/30/2013. I suggested we continue with Aricept 5 mg and long-acting Namenda 28 mg daily.     I saw her on 11/28/2013, at which time they reported that 5 out of a total 8 total siblings have/had AD, some with behavioral issues. I kept her on Namenda XR 20 mg daily and Aricept 5 mg daily. She was not able to tolerate Aricept at 10 mg daily, because of syncope and history of bradycardia.   She previously saw Dr. Erling Cruz and was last seen by him on 09/21/2012 at  which time he felt that she had improved on Namenda. He decreased her Aricept because of a history of syncope.   She lives with her two sisters and was first seen by Dr. Erling Cruz in 2009 with memory loss beginning in 2008 or before. She has short-term episodic memory loss beginning with conversations and in taking her medications She also has procedural memory loss, not knowing how to use the TV remote. 2 other sisters and her brother have dementia. Initial MMSE was 27/30, CDT 4/4, then on 05/06/2009 MMSE 28/30, CDT 4/4, AFT 8. On 09/23/2009 = MMSE 28/30, CDT 4/4. On 10/14/10 = MMSE 26/30,  CDT 4/4, AFT 12. Examination was remarkable for severe kyphosis and peripheral neuropathy. Workup for treatable causes of memory loss was negative including MRI study of the brain, TSH, B12, sedimentation rate, folate, and CBC. She is sleeping well. She no longer dresses herself, or takes care of her toilet needs. She feeds herself. They have a CNA 24/7. She has been accepted for long-term care insurance benefits. She denies depression, and there is no report of halluciation, delusions, outbursts or agitation. She does not exercise. She reads and enjoys television but is reading less than she used to. She does not cook or clean. She makes her bed. She attends church women activities and senior citizens group activities and a "friend's and Therapist, art. She has bladder incontinence but no bowel incontinence. She uses depends. She does not fall, but her gait is very unsteady. Falls assessment tool score is 17. She does not have bizarre dreams. On 10/19/11 = MMSE 26/30, CDT 4/4, AFT 8, Geriatric depression scale was 2/15.   04/12/2012 = MMSE 15/30, CDT 3/4, AFT 6. The patient passed out on 8/21/213 at the breakfast table without tonic-clonic activity. She was seen at [facility] and thought to be dehydrated with elevated WBC and urinary tract infection. She passed out again on 09/16/2012 at church in the sitting position without tonic-clonic activity or urinary incontinence.She had no warning that she was going to pass out with either incident. On 09/21/2012 = MMSE 22/30, CDT 4/4, AFT 7.   Her Past Medical History Is Significant For: Past Medical History  Diagnosis Date  . Depression     type 2  . Hyperlipidemia   . Osteopenia   . Memory loss, short term 3/09    mild cognitive impairment  . Abnormal CT of the chest 10/08    5 mm nodule- needs f/u CT in 1 year  . Squamous cell carcinoma of leg   . DM type 2 (diabetes mellitus, type 2) ([place])   . Dysrhythmia     "irregular at times"  . GERD  (gastroesophageal reflux disease)   . Alzheimer disease     "severe" (08/08/2012)    Her Past Surgical History Is Significant For: Past Surgical History  Procedure Laterality Date  . Total hip arthroplasty  ? first OR; 02/2005    right X 2  . Ischemic colitis  10/98    colonoscopy  . Squamous cell carcinoma excision  10/2001    on leg  . Pelvic fracture surgery  07/2002    after a fall  . Cataract extraction, bilateral  2010  . Dexa  11/01    osteopenia  . Vaginal hysterectomy  1972  . Bladder suspension  02/2001    pelvic vaginal sling for incontinence    Her Family History Is Significant For: Family History  Problem Relation Age of Onset  . Stroke  Mother   . Dementia Mother   . Stroke Father   . Breast cancer Sister   . Prostate cancer Brother   . Breast cancer Sister   . Memory loss Sister   . Breast cancer Other     neice  . Alzheimer's disease Sister     older sister  . Memory loss Sister     mild    Her Social History Is Significant For: Social History   Social History  . Marital Status: Single    Spouse Name: N/A  . Number of Children: 0  . Years of Education: college   Occupational History  . Retired Education officer, museum    Social History Main Topics  . Smoking status: Never Smoker   . Smokeless tobacco: Never Used  . Alcohol Use: No  . Drug Use: No  . Sexual Activity: Not Asked   Other Topics Concern  . None   Social History Narrative   Lives with her sister Anielle Starling) who helps care for her (and another sister).   Involved in church activities.    Her Allergies Are:  Allergies  Allergen Reactions  . Alendronate Sodium     REACTION: GI; "don't really remember reaction"  . Risedronate Sodium     REACTION: GI; "don't really remember reaction"  :   Her Current Medications Are:  Outpatient Encounter Prescriptions as of 06/01/2016  Medication Sig  . aspirin 81 MG tablet Take 81 mg by mouth daily.  Marland Kitchen atorvastatin (LIPITOR) 10 MG tablet  TAKE ONE TABLET BY MOUTH EVERY NIGHT AT BEDTIME  . calcitonin, salmon, (MIACALCIN/FORTICAL) 200 UNIT/ACT nasal spray USE ONE SPRAY IN EACH NOSTRIL DAILY *ALTERNATING NOSTRILS  . donepezil (ARICEPT) 5 MG tablet Take 1 tablet (5 mg total) by mouth at bedtime.  Marland Kitchen glucose blood test strip Use to check blood sugar daily and as directed. Dx 250.00  . memantine (NAMENDA XR) 28 MG CP24 24 hr capsule Take 1 capsule (28 mg total) by mouth daily.  . metFORMIN (GLUCOPHAGE) 500 MG tablet TAKE 1 TABLET BY MOUTH TWICE A DAY WITH MEALS  . pantoprazole (PROTONIX) 40 MG tablet TAKE ONE TABLET BY MOUTH EVERY MORNING   No facility-administered encounter medications on file as of 06/01/2016.  :  Review of Systems:  Out of a complete 14 point review of systems, all are reviewed and negative with the exception of these symptoms as listed below:   Review of Systems  Neurological:       No new concerns. Sister feels like the patient sleeps a lot.     Objective:  Neurologic Exam  Physical Exam Physical Examination:   Filed Vitals:   06/01/16 1155  BP: 142/66  Pulse: 80  Resp: 16   General Examination: The patient is a very pleasant 82 y.o. female in no acute distress. She is in her WC. She is minimally verbal. She appears more perky today, but intermittently has a tendency to doze off a couple of times, but is easily arousable and responsive and more interactive.   HEENT: Normocephalic, atraumatic, pupils are equal, round and reactive to light and accommodation. Extraocular tracking is fair without nystagmus noted. Hearing is grossly intact. Face is symmetric with normal facial animation and normal facial sensation. Speech is scant, not spontaneous, but clear with no dysarthria noted. There is mild hypophonia. There is no lip, neck or jaw tremor. Neck is supple with full range of motion. Oropharynx exam reveals mild dryness. No significant airway crowding is noted.  Mallampati is class II. Tongue protrudes  centrally and palate elevates symmetrically.    Chest: is clear to auscultation without wheezing, rhonchi or crackles noted.  Heart: sounds are for the most part regular with a split second heart sound and extra heartbeats noted with pauses, not much changed.   Abdomen: is soft, non-tender and non-distended with normal bowel sounds appreciated on auscultation.  Extremities: There is trace pitting edema in the R ankle. Pedal pulses are intact.  Skin: is warm and dry with no trophic changes noted, bruise in the distal L forearm, fell asleep with watch on at night, per caretaker.   Musculoskeletal: exam reveals no obvious joint deformities, tenderness or joint swelling or erythema.  Neurologically:  Mental status: The patient is awake, but does not pay much attention. She is calm and cooperative with the exam. She is oriented to self. Her memory, recall, attention, language and knowledge are significantly impaired. There is slowness in thinking. Speech is scarce and low volume, but clear. She speaks in 1 to 2 word sentences.   On 06/03/2014: Her MMSE is 5/30, clock drawing is 0/4, animal fluency is 2/minute.   On 12/04/2014: MMSE: 10/30, CDT: 1/4, AFT: 5/min. GDS is 3/15.  On 06/04/2015: MMSE: 13/30, CDT: 0/4, AFT: 0/min.   On 12/02/2015: MMSE: 17/30, CDT: 1.5/4, AFT: 5/min.   On 06/01/2016: MMSE: 15/30, CDT: 1.5/4, AFT: 5/min.  Affect is normal. Cranial nerves are as described above under HEENT exam. In addition, shoulder shrug is normal with equal shoulder height noted. Motor exam: thin bulk,  mildly reduced global strength is noted, tone seems normal. There is no drift, tremor or rebound. Romberg is not testable. Reflexes are 1+ throughout. Fine motor skills are mildly impaired globally.   Cerebellar testing shows no dysmetria or intention tremor. There is no truncal or gait ataxia.  Sensory exam is intact to light touch throughout.   Gait, station and balance: I did not have her stand or  walk for me today as she did not bring her walker.  Assessment and Plan:   In summary, [name] is a very pleasant 82 year old female with a history of advanced Alzheimers disease without behavioral disturbance. Her MMSE scores have been stable in the last few visits and she is able to tolerate Aricept generic 5 mg daily and Namenda long-acting 28 mg daily. I suggested we continue with her medications. I reminded her sister and her caretaker to push for oral fluid intake and gentle reminding of food intake. It may help to reheat it, when she has been at it for over half an hour. She has a strong family history of memory loss in several of her siblings. They were 8 of them, now only Jermisha and her sister [name], last sister died in Oct 24, 2015. We mutually agreed to keep her on Aricept 5 mg and Namenda long-acting 28 mg daily, I renewed her prescriptions. Their niece and nephew check on them nearly daily too. I suggested a six-month checkup, sooner if needed. I answered all their questions today and they were in agreement. I encouraged them to call with any interim questions, concerns, problems or updates and refill requests.  I spent 25 minutes in total face-to-face time with the patient, more than 50% of which was spent in counseling and coordination of care, reviewing test results, reviewing medication and discussing or reviewing the diagnosis of dementia, its prognosis and treatment options.

## 2016-06-01 NOTE — Patient Instructions (Signed)
We will continue with your memory meds. Things are thankfully stable.

## 2016-07-03 ENCOUNTER — Telehealth: Payer: Self-pay | Admitting: Family Medicine

## 2016-07-03 DIAGNOSIS — E119 Type 2 diabetes mellitus without complications: Secondary | ICD-10-CM

## 2016-07-03 DIAGNOSIS — Z0000 Encounter for general adult medical examination without abnormal findings: Secondary | ICD-10-CM

## 2016-07-03 NOTE — Telephone Encounter (Signed)
-----   Message from Marchia Bond sent at 06/30/2016 10:24 AM EDT ----- Regarding: Cpx labs Thurs 7/20, need orders. Thanks! :-) Please order  future cpx labs for pt's upcoming lab appt. Thanks Aniceto Boss

## 2016-07-07 ENCOUNTER — Other Ambulatory Visit (INDEPENDENT_AMBULATORY_CARE_PROVIDER_SITE_OTHER): Payer: Medicare Other

## 2016-07-07 DIAGNOSIS — Z0000 Encounter for general adult medical examination without abnormal findings: Secondary | ICD-10-CM | POA: Diagnosis not present

## 2016-07-07 DIAGNOSIS — E119 Type 2 diabetes mellitus without complications: Secondary | ICD-10-CM

## 2016-07-07 LAB — LIPID PANEL
Cholesterol: 125 mg/dL (ref 0–200)
HDL: 50.4 mg/dL
LDL Cholesterol: 63 mg/dL (ref 0–99)
NonHDL: 74.99
Total CHOL/HDL Ratio: 2
Triglycerides: 60 mg/dL (ref 0.0–149.0)
VLDL: 12 mg/dL (ref 0.0–40.0)

## 2016-07-07 LAB — CBC WITH DIFFERENTIAL/PLATELET
BASOS ABS: 0 10*3/uL (ref 0.0–0.1)
Basophils Relative: 0.4 % (ref 0.0–3.0)
EOS PCT: 2.7 % (ref 0.0–5.0)
Eosinophils Absolute: 0.2 10*3/uL (ref 0.0–0.7)
HEMATOCRIT: 32.2 % — AB (ref 36.0–46.0)
HEMOGLOBIN: 10.4 g/dL — AB (ref 12.0–15.0)
LYMPHS PCT: 17.7 % (ref 12.0–46.0)
Lymphs Abs: 1.3 10*3/uL (ref 0.7–4.0)
MCHC: 32.4 g/dL (ref 30.0–36.0)
MCV: 81.5 fl (ref 78.0–100.0)
MONOS PCT: 10.4 % (ref 3.0–12.0)
Monocytes Absolute: 0.8 10*3/uL (ref 0.1–1.0)
NEUTROS PCT: 68.8 % (ref 43.0–77.0)
Neutro Abs: 5 10*3/uL (ref 1.4–7.7)
Platelets: 344 10*3/uL (ref 150.0–400.0)
RBC: 3.95 Mil/uL (ref 3.87–5.11)
RDW: 16.6 % — ABNORMAL HIGH (ref 11.5–15.5)
WBC: 7.3 10*3/uL (ref 4.0–10.5)

## 2016-07-07 LAB — COMPREHENSIVE METABOLIC PANEL WITH GFR
ALT: 10 U/L (ref 0–35)
AST: 15 U/L (ref 0–37)
Albumin: 3.4 g/dL — ABNORMAL LOW (ref 3.5–5.2)
Alkaline Phosphatase: 70 U/L (ref 39–117)
BUN: 15 mg/dL (ref 6–23)
CO2: 27 meq/L (ref 19–32)
Calcium: 9.2 mg/dL (ref 8.4–10.5)
Chloride: 101 meq/L (ref 96–112)
Creatinine, Ser: 0.88 mg/dL (ref 0.40–1.20)
GFR: 63.89 mL/min
Glucose, Bld: 88 mg/dL (ref 70–99)
Potassium: 4.4 meq/L (ref 3.5–5.1)
Sodium: 136 meq/L (ref 135–145)
Total Bilirubin: 0.6 mg/dL (ref 0.2–1.2)
Total Protein: 6.4 g/dL (ref 6.0–8.3)

## 2016-07-07 LAB — MICROALBUMIN / CREATININE URINE RATIO
CREATININE, U: 46.5 mg/dL
MICROALB UR: 2.3 mg/dL — AB (ref 0.0–1.9)
Microalb Creat Ratio: 5 mg/g (ref 0.0–30.0)

## 2016-07-07 LAB — TSH: TSH: 4.91 u[IU]/mL — ABNORMAL HIGH (ref 0.35–4.50)

## 2016-07-07 LAB — HEMOGLOBIN A1C: Hgb A1c MFr Bld: 6.5 % (ref 4.6–6.5)

## 2016-07-07 NOTE — Addendum Note (Signed)
Addended by: [name] on: 07/07/2016 09:04 AM   Modules accepted: Orders

## 2016-07-13 ENCOUNTER — Ambulatory Visit (INDEPENDENT_AMBULATORY_CARE_PROVIDER_SITE_OTHER)
Admission: RE | Admit: 2016-07-13 | Discharge: 2016-07-13 | Disposition: A | Discharge status: Home / Self Care | Payer: Medicare Other | Source: Ambulatory Visit | Attending: Family Medicine | Admitting: Family Medicine

## 2016-07-13 ENCOUNTER — Encounter: Payer: Self-pay | Admitting: Family Medicine

## 2016-07-13 ENCOUNTER — Ambulatory Visit (INDEPENDENT_AMBULATORY_CARE_PROVIDER_SITE_OTHER): Payer: Medicare Other | Admitting: Family Medicine

## 2016-07-13 VITALS — BP 134/62 | HR 85 | Temp 97.5°F | Wt 130.5 lb

## 2016-07-13 DIAGNOSIS — Z0000 Encounter for general adult medical examination without abnormal findings: Secondary | ICD-10-CM | POA: Diagnosis not present

## 2016-07-13 DIAGNOSIS — R059 Cough, unspecified: Secondary | ICD-10-CM

## 2016-07-13 DIAGNOSIS — D649 Anemia, unspecified: Secondary | ICD-10-CM

## 2016-07-13 DIAGNOSIS — R05 Cough: Secondary | ICD-10-CM

## 2016-07-13 DIAGNOSIS — M810 Age-related osteoporosis without current pathological fracture: Secondary | ICD-10-CM

## 2016-07-13 DIAGNOSIS — E2839 Other primary ovarian failure: Secondary | ICD-10-CM | POA: Insufficient documentation

## 2016-07-13 DIAGNOSIS — E119 Type 2 diabetes mellitus without complications: Secondary | ICD-10-CM | POA: Diagnosis not present

## 2016-07-13 DIAGNOSIS — F0390 Unspecified dementia without behavioral disturbance: Secondary | ICD-10-CM

## 2016-07-13 DIAGNOSIS — I10 Essential (primary) hypertension: Secondary | ICD-10-CM

## 2016-07-13 DIAGNOSIS — R7989 Other specified abnormal findings of blood chemistry: Secondary | ICD-10-CM

## 2016-07-13 DIAGNOSIS — E785 Hyperlipidemia, unspecified: Secondary | ICD-10-CM

## 2016-07-13 NOTE — Assessment & Plan Note (Signed)
Disc goals for lipids and reasons to control them Rev labs with pt Rev low sat fat diet in detail  Well controlled with statin and diet  

## 2016-07-13 NOTE — Progress Notes (Signed)
Subjective:    Patient ID: [name], female    DOB: 1922/05/12, 82 y.o.   MRN: [id number]  HPI Here for health maintenance exam and to review chronic medical problems    Pt has mod to severe dementia-cared for by her family/sister  Wt Readings from Last 3 Encounters:  07/13/16 130 lb 8 oz (59.2 kg)  01/01/16 130 lb 8 oz (59.2 kg)  07/27/15 126 lb 12 oz (57.5 kg)   bmi is 25.4 Appetite is very good  Long time to eat - with her dementia   dexa 11/02 osteopenia  Falls-just slipped out of chair once- no inj/ always supervised  fx hx - never broken a bone  Ca and D Is on miacalcin  Is interested in another dexa   Mammogram 5/12 neg- declines further due to age  No lumps that she or family knows of  Strong family hx of breast cancer   Colonoscopy 4/14-had diverticulosis  No further screening rec at her age   Eye exam 2/17 up to date   utd on all immunizations at this time   Has had a hysterectomy in the past   bp is stable today -no complaints at all  No cp or palpitations or headaches or edema  No side effects to medicines  BP Readings from Last 3 Encounters:  07/13/16 134/62  06/01/16 (!) 142/66  05/09/16 118/70    Not on any blood pressure medications- monitoring     Chemistry      Component Value Date/Time   NA 136 07/07/2016 0839   K 4.4 07/07/2016 0839   CL 101 07/07/2016 0839   CO2 27 07/07/2016 0839   BUN 15 07/07/2016 0839   CREATININE 0.88 07/07/2016 0839      Component Value Date/Time   CALCIUM 9.2 07/07/2016 0839   ALKPHOS 70 07/07/2016 0839   AST 15 07/07/2016 0839   ALT 10 07/07/2016 0839   BILITOT 0.6 07/07/2016 0839      DM2 Lab Results  Component Value Date   HGBA1C 6.5 07/07/2016  very well controlled with current diet   Lab Results  Component Value Date   MICROALBUR 2.3 (H) 07/07/2016    Dementia-sees neurology On aricept and namenda - has better days and worse days - overall content and feels ok   Hx of  hyperlipidemia Lab Results  Component Value Date   CHOL 125 07/07/2016   CHOL 127 12/30/2015   CHOL 137 06/19/2015   Lab Results  Component Value Date   HDL 50.40 07/07/2016   HDL 49.50 12/30/2015   HDL 49.40 06/19/2015   Lab Results  Component Value Date   LDLCALC 63 07/07/2016   LDLCALC 64 12/30/2015   LDLCALC 71 06/19/2015   Lab Results  Component Value Date   TRIG 60.0 07/07/2016   TRIG 67.0 12/30/2015   TRIG 82.0 06/19/2015   Lab Results  Component Value Date   CHOLHDL 2 07/07/2016   CHOLHDL 3 12/30/2015   CHOLHDL 3 06/19/2015   No results found for: LDLDIRECT  On atorvastatin and diet-very well controlled  On asa daily   Tends to clear her throat and cough occ  This has been going on for a long time On ppi for gerd  And on claritin for allergies  Desires a chest xray today    Last tsh Lab Results  Component Value Date   TSH 4.91 (H) 07/07/2016    Not on thyroid medication  Has been mildly elevated  in the past   Lab Results  Component Value Date   WBC 7.3 07/07/2016   HGB 10.4 (L) 07/07/2016   HCT 32.2 (L) 07/07/2016   MCV 81.5 07/07/2016   PLT 344.0 07/07/2016   possibly not enough iron in diet Does not want a GI w/u  ? If age rel Renal function is ok   Patient Active Problem List   Diagnosis Date Noted  . Estrogen deficiency 07/13/2016  . Anemia 07/13/2016  . Cough 11/23/2015  . Herpes zoster 07/27/2015  . Abnormal TSH 06/26/2015  . Routine general medical examination at a health care facility 06/26/2015  . Gait disorder 07/29/2014  . Mobility impaired 07/25/2014  . Encounter for Medicare annual wellness exam 06/24/2014  . Venous insufficiency 08/17/2012  . Syncope 08/08/2012  . Dementia 08/08/2012  . Abnormal EKG 08/08/2012  . Essential hypertension, benign 12/22/2009  . Osteoporosis 09/24/2007  . GERD 07/16/2007  . Diabetes type 2, controlled ([place]) 07/13/2007  . Hyperlipidemia 07/13/2007  . OVERACTIVE BLADDER 07/13/2007  .  DEGENERATIVE JOINT DISEASE 07/13/2007  . INCONTINENCE, URGE 07/13/2007   Past Medical History:  Diagnosis Date  . Abnormal CT of the chest 10/08   5 mm nodule- needs f/u CT in 1 year  . Alzheimer disease    "severe" (08/08/2012)  . Depression    type 2  . DM type 2 (diabetes mellitus, type 2) ([place])   . Dysrhythmia    "irregular at times"  . GERD (gastroesophageal reflux disease)   . Hyperlipidemia   . Memory loss, short term 3/09   mild cognitive impairment  . Osteopenia   . Squamous cell carcinoma of leg    Past Surgical History:  Procedure Laterality Date  . BLADDER SUSPENSION  02/2001   pelvic vaginal sling for incontinence  . CATARACT EXTRACTION, BILATERAL  2010  . Dexa  11/01   osteopenia  . ischemic colitis  10/98   colonoscopy  . PELVIC FRACTURE SURGERY  07/2002   after a fall  . SQUAMOUS CELL CARCINOMA EXCISION  10/2001   on leg  . TOTAL HIP ARTHROPLASTY  ? first OR; 02/2005   right X 2  . VAGINAL HYSTERECTOMY  1972   Social History  Substance Use Topics  . Smoking status: Never Smoker  . Smokeless tobacco: Never Used  . Alcohol use No   Family History  Problem Relation Age of Onset  . Stroke Mother   . Dementia Mother   . Stroke Father   . Breast cancer Sister   . Prostate cancer Brother   . Breast cancer Sister   . Memory loss Sister   . Breast cancer Other     [place]  . Alzheimer's disease Sister     older sister  . Memory loss Sister     mild   Allergies  Allergen Reactions  . Alendronate Sodium     REACTION: GI; "don't really remember reaction"  . Risedronate Sodium     REACTION: GI; "don't really remember reaction"   Current Outpatient Prescriptions on File Prior to Visit  Medication Sig Dispense Refill  . aspirin 81 MG tablet Take 81 mg by mouth daily.    Marland Kitchen atorvastatin (LIPITOR) 10 MG tablet TAKE ONE TABLET BY MOUTH EVERY NIGHT AT BEDTIME 90 tablet 3  . calcitonin, salmon, (MIACALCIN/FORTICAL) 200 UNIT/ACT nasal spray USE ONE SPRAY IN  EACH NOSTRIL DAILY *ALTERNATING NOSTRILS 11.1 mL 2  . donepezil (ARICEPT) 5 MG tablet Take 1 tablet (5 mg total) by  mouth at bedtime. 90 tablet 3  . glucose blood test strip Use to check blood sugar daily and as directed. Dx 250.00 100 each 6  . memantine (NAMENDA XR) 28 MG CP24 24 hr capsule Take 1 capsule (28 mg total) by mouth daily. 90 capsule 3  . metFORMIN (GLUCOPHAGE) 500 MG tablet TAKE 1 TABLET BY MOUTH TWICE A DAY WITH MEALS 180 tablet 3  . pantoprazole (PROTONIX) 40 MG tablet TAKE ONE TABLET BY MOUTH EVERY MORNING 90 tablet 1   No current facility-administered medications on file prior to visit.     Review of Systems Review of Systems  Constitutional: Negative for fever, appetite change, fatigue and unexpected weight change.  Eyes: Negative for pain and visual disturbance.  Respiratory: Negative for wheeze and shortness of breath.  pos for chronic cough with throat clearing  Cardiovascular: Negative for cp or palpitations    Gastrointestinal: Negative for nausea, diarrhea and constipation.  Genitourinary: Negative for urgency and frequency.  Skin: Negative for pallor or rash   Neurological: Negative for weakness, light-headedness, numbness and headaches. pos for mod to severe dementia w/o behavioral disturbance Hematological: Negative for adenopathy. Does not bruise/bleed easily.  Psychiatric/Behavioral: Negative for dysphoric mood. The patient is not nervous/anxious.         Objective:   Physical Exam  Constitutional: She appears well-developed and well-nourished. No distress.  Frail appearing elderly female with dementia sitting in wheelchair    HENT:  Head: Normocephalic and atraumatic.  Right Ear: External ear normal.  Left Ear: External ear normal.  Mouth/Throat: Oropharynx is clear and moist.  Nares are boggy with some clear rhinorrhea  Eyes: Conjunctivae and EOM are normal. Pupils are equal, round, and reactive to light. No scleral icterus.  Neck: Normal range of  motion. Neck supple. No JVD present. Carotid bruit is not present. No thyromegaly present.  Cardiovascular: Normal rate, regular rhythm, normal heart sounds and intact distal pulses.  Exam reveals no gallop.   Pulmonary/Chest: Effort normal and breath sounds normal. No respiratory distress. She has no wheezes. She exhibits no tenderness.  Abdominal: Soft. Bowel sounds are normal. She exhibits no distension, no abdominal bruit and no mass. There is no tenderness.  Musculoskeletal: Normal range of motion. She exhibits no edema or tenderness.  Lymphadenopathy:    She has no cervical adenopathy.  Neurological: She is alert. She has normal reflexes. No cranial nerve deficit. She exhibits normal muscle tone. Coordination normal.  Skin: Skin is warm and dry. No rash noted. No erythema. No pallor.  Many SKs diffusely Nl color and turgor  Psychiatric: Her mood appears not anxious. She is not agitated. Cognition and memory are impaired. She does not exhibit a depressed mood.  Pt has dementia Content and sitting on wheelchair/occ dozes  Answers a few questions yes or no and can follow some simple instructions Smiles easily           Assessment & Plan:   Problem List Items Addressed This Visit      Cardiovascular and Mediastinum   Essential hypertension, benign - Primary    bp is ok w/o med at this time Apprehensive to start ace due to baseline chronic cough as well as hx of syncope Continue to monitor        Endocrine   Diabetes type 2, controlled ([place])    Well controlled with metformin and diet  Lab Results  Component Value Date   HGBA1C 6.5 07/07/2016  Nervous and Auditory   Dementia    On namenda and aricept Sees neurology  Good care Content and good quality of life         Musculoskeletal and Integument   Osteoporosis    Due for dexa Ref for that  On miacalcin No new falls or fx         Other   Routine general medical examination at a health care  facility    Reviewed health habits including diet and exercise and skin cancer prevention Reviewed appropriate screening tests for age  Also reviewed health mt list, fam hx and immunization status , as well as social and family history   Pt has had home visit- may not need AMW (also dementia is mod to severe) Labs rev See HPI Stop at check out to schedule a bone density test  Chest xray today for cough-we will contact you with a result  Follow up in 6 months with lab prior  If you decide you want or need a medicare interview visit -call and make an appointment with your nurse Lesia  Increase iron in diet/greens and red meat  Watching anemia Watching thyroid       Hyperlipidemia    Disc goals for lipids and reasons to control them Rev labs with pt Rev low sat fat diet in detail Well controlled with statin and diet       Estrogen deficiency   Relevant Orders   DG Bone Density   Cough    Ongoing despite antihistamine for pnd and also ppi for gerd Will do cxr today      Relevant Orders   DG Chest 2 View (Completed)   Anemia    Suspect anemia of chronic disease  will make effort to give more iron in food Given handout  Will continue to watch  On ppi Does not desire GI work up due to age       Abnormal TSH    Very slightly elevated Subclinical/no symptoms Will not tx at this time Continue to follow       Other Visit Diagnoses   None.

## 2016-07-13 NOTE — Assessment & Plan Note (Addendum)
bp is ok w/o med at this time Apprehensive to start ace due to baseline chronic cough as well as hx of syncope Continue to monitor

## 2016-07-13 NOTE — Assessment & Plan Note (Signed)
On namenda and aricept Sees neurology  Good care Content and good quality of life

## 2016-07-13 NOTE — Assessment & Plan Note (Addendum)
Suspect anemia of chronic disease  will make effort to give more iron in food Given handout  Will continue to watch  On ppi Does not desire GI work up due to age

## 2016-07-13 NOTE — Progress Notes (Signed)
Pre visit review using our clinic review tool, if applicable. No additional management support is needed unless otherwise documented below in the visit note. 

## 2016-07-13 NOTE — Assessment & Plan Note (Signed)
Due for dexa Ref for that  On miacalcin No new falls or fx

## 2016-07-13 NOTE — Assessment & Plan Note (Signed)
Reviewed health habits including diet and exercise and skin cancer prevention Reviewed appropriate screening tests for age  Also reviewed health mt list, fam hx and immunization status , as well as social and family history   Pt has had home visit- may not need AMW (also dementia is mod to severe) Labs rev See HPI Stop at check out to schedule a bone density test  Chest xray today for cough-we will contact you with a result  Follow up in 6 months with lab prior  If you decide you want or need a medicare interview visit -call and make an appointment with your nurse Lesia  Increase iron in diet/greens and red meat  Watching anemia Watching thyroid

## 2016-07-13 NOTE — Patient Instructions (Addendum)
Stop at check out to schedule a bone density test  Chest xray today for cough-we will contact you with a result  Follow up in 6 months with lab prior  If you decide you want or need a medicare interview visit -call and make an appointment with your nurse Lesia  Increase iron in diet/greens and red meat  Watching anemia Watching thyroid

## 2016-07-13 NOTE — Assessment & Plan Note (Signed)
Ongoing despite antihistamine for pnd and also ppi for gerd Will do cxr today

## 2016-07-13 NOTE — Assessment & Plan Note (Signed)
Very slightly elevated Subclinical/no symptoms Will not tx at this time Continue to follow

## 2016-07-13 NOTE — Assessment & Plan Note (Signed)
Well controlled with metformin and diet  Lab Results  Component Value Date   HGBA1C 6.5 07/07/2016

## 2016-07-14 ENCOUNTER — Telehealth: Payer: Self-pay | Admitting: Family Medicine

## 2016-07-14 DIAGNOSIS — R059 Cough, unspecified: Secondary | ICD-10-CM

## 2016-07-14 DIAGNOSIS — R05 Cough: Secondary | ICD-10-CM

## 2016-07-14 DIAGNOSIS — J90 Pleural effusion, not elsewhere classified: Secondary | ICD-10-CM

## 2016-07-14 NOTE — Telephone Encounter (Signed)
-----   Message from Tammi Sou, [place] sent at 07/13/2016  3:35 PM EDT ----- Pt's sister Sharyn Lull notified of xray results and Dr. Marliss Coots comments. Sister agrees with CT scan if possible she wants to have it done in [place]. Please put order in and I have advise pt's sister that one of our PCC's will call to schedule CT scan

## 2016-07-14 NOTE — Telephone Encounter (Signed)
Ordered CT of chest -will route to [facility]

## 2016-07-21 ENCOUNTER — Other Ambulatory Visit: Payer: Medicare Other

## 2016-07-22 ENCOUNTER — Ambulatory Visit (INDEPENDENT_AMBULATORY_CARE_PROVIDER_SITE_OTHER)
Admission: RE | Admit: 2016-07-22 | Discharge: 2016-07-22 | Disposition: A | Discharge status: Home / Self Care | Payer: Medicare Other | Source: Ambulatory Visit | Attending: Family Medicine | Admitting: Family Medicine

## 2016-07-22 DIAGNOSIS — J90 Pleural effusion, not elsewhere classified: Secondary | ICD-10-CM | POA: Diagnosis not present

## 2016-07-22 DIAGNOSIS — R05 Cough: Secondary | ICD-10-CM | POA: Diagnosis not present

## 2016-07-22 DIAGNOSIS — R059 Cough, unspecified: Secondary | ICD-10-CM

## 2016-07-25 ENCOUNTER — Telehealth: Payer: Self-pay | Admitting: Family Medicine

## 2016-07-25 MED ORDER — AZITHROMYCIN 250 MG PO TABS: ORAL_TABLET | ORAL | 0 refills | Status: DC

## 2016-07-25 NOTE — Telephone Encounter (Signed)
-----   Message from Abner Greenspan, MD sent at 07/23/2016 10:08 AM EDT ----- CT of the chest shows some fluid in the bases of the lungs/ some scarring in the middle lobe No outright pneumonia was seen  Lymph nodes appeared slt enlarged  This could be reactive to some sort of infection or chemical/smoke exposure I would like to cover her with a broad spectrum abx and then if not better- let me know (cough) and I will refer her to a lung specialist  Please call in zpak take as directed (2 pills by mouth today and then 1 pill daily for 4 days)   #6 no ref Update me after please

## 2016-07-25 NOTE — Telephone Encounter (Signed)
[name] notified of CT results and Dr. Marliss Coots comments. Rx sent to pharmacy and pt's sister will keep [place] updated on pt's sxs

## 2016-07-25 NOTE — Telephone Encounter (Signed)
[name] called to get pt ct results from friday

## 2016-07-26 ENCOUNTER — Ambulatory Visit (INDEPENDENT_AMBULATORY_CARE_PROVIDER_SITE_OTHER)
Admission: RE | Admit: 2016-07-26 | Discharge: 2016-07-26 | Disposition: A | Discharge status: Home / Self Care | Payer: Medicare Other | Source: Ambulatory Visit | Attending: Family Medicine | Admitting: Family Medicine

## 2016-07-26 DIAGNOSIS — E2839 Other primary ovarian failure: Secondary | ICD-10-CM

## 2016-08-01 ENCOUNTER — Telehealth: Payer: Self-pay | Admitting: Family Medicine

## 2016-08-01 NOTE — Telephone Encounter (Signed)
Sister called to check on bone density results

## 2016-08-01 NOTE — Telephone Encounter (Signed)
Addressed through results notes  

## 2016-08-31 ENCOUNTER — Ambulatory Visit (INDEPENDENT_AMBULATORY_CARE_PROVIDER_SITE_OTHER): Payer: Medicare Other

## 2016-08-31 DIAGNOSIS — Z23 Encounter for immunization: Secondary | ICD-10-CM | POA: Diagnosis not present

## 2016-09-06 ENCOUNTER — Telehealth: Payer: Self-pay | Admitting: Family Medicine

## 2016-09-06 NOTE — Telephone Encounter (Signed)
Spoke with [name] from [employer] regarding patient's condition as reported by her CNA, [name].  I received verbal permission from patient's POA and sister, [name] to speak with [facility] for further information.  [name] reports that in addition to cough, low grade fever and wheeze, (increasing X2 days), that patient is experiencing changes in breathing patterns. [name] states "yes" when asked about difficulty breathing.  VS taken are listed in team health report.  This Probation officer discusses patient condition with Dr. Glori Bickers.  We are unable to get her in until tomorrow afternoon and due to symptoms, it is strongly recommended that she be seen today.  After checking other provider's schedules there are no openings and patient should go to urgent care or emergency room for further evaluation.  Alexandria Hoes, CNA, was notified and directions given to local urgent care in [place].  CNA and POA verbalize understanding and agreement with the plan.  They will call back and cancel tomorrow's appointment with Dr. Glori Bickers if not needed.

## 2016-09-06 NOTE — Telephone Encounter (Signed)
Aware. 

## 2016-09-06 NOTE — Telephone Encounter (Signed)
Patient Name: [name] DOB: [birth year] Initial Comment Caller states patient's BP 144/82, P 102, temp 99.2, cough, and wheezing on right side. Nurse Assessment Nurse: Ronnald Ramp, RN, [place] Date/Time (Eastern Time): 09/06/2016 8:28:30 AM Confirm and document reason for call. If symptomatic, describe symptoms. You must click the next button to save text entered. ---Caller states she is the CNA, pt is coughing, wheezing, and has a fever. BP 144/82 , HR 102, temp 99.1. She has had a cough for about 1 month, worse since Wednesday. Has the patient traveled out of the country within the last 30 days? ---No Does the patient have any new or worsening symptoms? ---Yes Will a triage be completed? ---Yes Related visit to physician within the last 2 weeks? ---No Does the PT have any chronic conditions? (i.e. diabetes, asthma, etc.) ---Yes List chronic conditions. ---Diabetes, High Cholesterol, GERD, Dementia Is this a behavioral health or substance abuse call? ---No Guidelines Guideline Title Affirmed Question Affirmed Notes Cough - Chronic SEVERE coughing spells (e.g., whooping sound after coughing, vomiting after coughing) Heart Rate and Heartbeat Questions New or worsened ankle swelling Final Disposition User See Physician within 4 Hours (or PCP triage) Ronnald Ramp, RN, Miranda Comments CPOX 91% Called backline and spoke with Leafy Ro, nurse. Gave pt information and told that note had been updated. Referrals GO TO FACILITY UNDECIDED Disagree/Comply: Comply Disagree/Comply: Comply

## 2016-09-07 ENCOUNTER — Ambulatory Visit (INDEPENDENT_AMBULATORY_CARE_PROVIDER_SITE_OTHER): Payer: Medicare Other | Admitting: Family Medicine

## 2016-09-07 ENCOUNTER — Ambulatory Visit: Payer: Medicare Other | Admitting: Podiatry

## 2016-09-07 ENCOUNTER — Other Ambulatory Visit: Payer: Self-pay | Admitting: Family Medicine

## 2016-09-07 ENCOUNTER — Encounter: Payer: Self-pay | Admitting: Family Medicine

## 2016-09-07 VITALS — BP 128/54 | HR 93 | Temp 98.1°F

## 2016-09-07 DIAGNOSIS — J069 Acute upper respiratory infection, unspecified: Secondary | ICD-10-CM

## 2016-09-07 DIAGNOSIS — R059 Cough, unspecified: Secondary | ICD-10-CM

## 2016-09-07 DIAGNOSIS — R05 Cough: Secondary | ICD-10-CM | POA: Diagnosis not present

## 2016-09-07 NOTE — Patient Instructions (Signed)
I will be on the lookout for the xray from [facility] clinic  Keep doing what you are doing  We will call you regarding a pulmonary referral  If cough or temp worsens in the meantime let me know  I think this is a cold worsening chronic cough - exam is re assuring today

## 2016-09-07 NOTE — Progress Notes (Signed)
Subjective:    Patient ID: [name], female    DOB: October 12, 1922, 82 y.o.   MRN: [id number]  HPI Here for a cough-worsening for a month    and low grade temp yesterday Sunday afternoon and yesterday highest 99.8  None yesterday  Still a dry cough  Comes and goes (in spells)-more frequent  Seems sob during a coughing spell  Not today  Thought yesterday she had deeper breathing     Went to [facility] yesterday  ;ulse ox and vitals were good rec delsym and ordered tessalon  Has not tried delsym for cough    Had a chest xray  Not read yet   Hx of pleural eff/small and lingular scarring on last CT in aug  CT Chest Wo Contrast (Accession [id number]) (Order [id number])  Imaging  Date: 07/22/2016 Department: [facility] Released By: Ardelle Anton Authorizing: Abner Greenspan, MD  PACS Images   Show images for CT Chest Wo Contrast  Study Result   CLINICAL DATA:  Cough, pleural effusion seen on chest x-ray.  EXAM: CT CHEST WITHOUT CONTRAST  TECHNIQUE: Multidetector CT imaging of the chest was performed following the standard protocol without IV contrast.  COMPARISON:  Chest x-ray 07/13/2016.  Chest CT 10/01/2007.  FINDINGS: Cardiovascular: There is cardiomegaly. Diffuse aortic calcifications without aneurysm. Calcifications at the origin of the left main coronary artery.  Mediastinum/Nodes: Mildly prominent subcarinal lymph node, 12 mm in short axis diameter. No other mediastinal adenopathy. No visible hilar adenopathy. No axillary adenopathy. Moderate-sized hiatal hernia.  Lungs/Pleura: Small bilateral effusions, right greater than left. Scarring in the lingula and right middle lobe. Right basilar scarring. No confluent airspace opacities or suspicious pulmonary nodules.  Upper Abdomen: Imaging into the upper abdomen shows no acute findings.  Musculoskeletal: No acute bony abnormality or focal bone lesion. Chest  wall soft tissues are unremarkable.  IMPRESSION: Small bilateral pleural effusions, right greater than left. Right base atelectasis. Lingular and right middle lobe scarring.  Borderline enlarged subcarinal lymph nodes, presumably reactive.  Moderate-sized hiatal hernia.  Cardiomegaly, aortic atherosclerosis.   Electronically Signed   By: Rolm Baptise M.D.   On: 07/22/2016 14:12    Never smoked   She has had a chronic cough for over a year  GERD is well controlled  Has tried claritin   No other c/o  Nose runs all the time -clear   No wheezing   Is interested in seeing pulmonary for the chronic symptoms   Patient Active Problem List   Diagnosis Date Noted  . URI (upper respiratory infection) 09/07/2016  . Pleural effusion 07/14/2016  . Estrogen deficiency 07/13/2016  . Anemia 07/13/2016  . Cough 11/23/2015  . Herpes zoster 07/27/2015  . Abnormal TSH 06/26/2015  . Routine general medical examination at a health care facility 06/26/2015  . Gait disorder 07/29/2014  . Mobility impaired 07/25/2014  . Encounter for Medicare annual wellness exam 06/24/2014  . Venous insufficiency 08/17/2012  . Syncope 08/08/2012  . Dementia 08/08/2012  . Abnormal EKG 08/08/2012  . Essential hypertension, benign 12/22/2009  . Osteoporosis 09/24/2007  . GERD 07/16/2007  . Diabetes type 2, controlled ([place]) 07/13/2007  . Hyperlipidemia 07/13/2007  . OVERACTIVE BLADDER 07/13/2007  . DEGENERATIVE JOINT DISEASE 07/13/2007  . INCONTINENCE, URGE 07/13/2007   Past Medical History:  Diagnosis Date  . Abnormal CT of the chest 10/08   5 mm nodule- needs f/u CT in 1 year  . Alzheimer disease    "  severe" (08/08/2012)  . Depression    type 2  . DM type 2 (diabetes mellitus, type 2) ([place])   . Dysrhythmia    "irregular at times"  . GERD (gastroesophageal reflux disease)   . Hyperlipidemia   . Memory loss, short term 3/09   mild cognitive impairment  . Osteopenia   . Squamous cell  carcinoma of leg    Past Surgical History:  Procedure Laterality Date  . BLADDER SUSPENSION  02/2001   pelvic vaginal sling for incontinence  . CATARACT EXTRACTION, BILATERAL  2010  . Dexa  11/01   osteopenia  . ischemic colitis  10/98   colonoscopy  . PELVIC FRACTURE SURGERY  07/2002   after a fall  . SQUAMOUS CELL CARCINOMA EXCISION  10/2001   on leg  . TOTAL HIP ARTHROPLASTY  ? first OR; 02/2005   right X 2  . VAGINAL HYSTERECTOMY  1972   Social History  Substance Use Topics  . Smoking status: Never Smoker  . Smokeless tobacco: Never Used  . Alcohol use No   Family History  Problem Relation Age of Onset  . Stroke Mother   . Dementia Mother   . Stroke Father   . Breast cancer Sister   . Prostate cancer Brother   . Breast cancer Sister   . Memory loss Sister   . Breast cancer Other     neice  . Alzheimer's disease Sister     older sister  . Memory loss Sister     mild   Allergies  Allergen Reactions  . Alendronate Sodium     REACTION: GI; "don't really remember reaction"  . Risedronate Sodium     REACTION: GI; "don't really remember reaction"   Current Outpatient Prescriptions on File Prior to Visit  Medication Sig Dispense Refill  . aspirin 81 MG tablet Take 81 mg by mouth daily.    Marland Kitchen donepezil (ARICEPT) 5 MG tablet Take 1 tablet (5 mg total) by mouth at bedtime. 90 tablet 3  . glucose blood test strip Use to check blood sugar daily and as directed. Dx 250.00 100 each 6  . loratadine (CLARITIN) 10 MG tablet Take 10 mg by mouth daily.    . memantine (NAMENDA XR) 28 MG CP24 24 hr capsule Take 1 capsule (28 mg total) by mouth daily. 90 capsule 3  . metFORMIN (GLUCOPHAGE) 500 MG tablet TAKE 1 TABLET BY MOUTH TWICE A DAY WITH MEALS 180 tablet 3  . pantoprazole (PROTONIX) 40 MG tablet TAKE ONE TABLET BY MOUTH EVERY MORNING 90 tablet 1   No current facility-administered medications on file prior to visit.     Review of Systems    Review of Systems    Constitutional: Negative for fever, appetite change, fatigue and unexpected weight change.  Eyes: Negative for pain and visual disturbance.  Respiratory: Negative for wheeze  and shortness of breath.  pos for acute on chronic cough  Cardiovascular: Negative for cp or palpitations    Gastrointestinal: Negative for nausea, diarrhea and constipation.  Genitourinary: Negative for urgency and frequency.  Skin: Negative for pallor or rash   Neurological: Negative for weakness, light-headedness, numbness and headaches.  Hematological: Negative for adenopathy. Does not bruise/bleed easily.  Psychiatric/Behavioral: Negative for dysphoric mood. The patient is not nervous/anxious.  pos for demential    Objective:   Physical Exam  Constitutional: She appears well-developed and well-nourished. No distress.  Frail app elderly female with dementia in wheelchair -in no distress   HENT:  Head: Normocephalic and atraumatic.  Right Ear: External ear normal.  Left Ear: External ear normal.  Mouth/Throat: Oropharynx is clear and moist.  Nares are injected and congested  No sinus tenderness Clear rhinorrhea and post nasal drip   Eyes: Conjunctivae and EOM are normal. Pupils are equal, round, and reactive to light. Right eye exhibits no discharge. Left eye exhibits no discharge.  Neck: Normal range of motion. Neck supple.  Cardiovascular: Normal rate and normal heart sounds.   Pulmonary/Chest: Effort normal and breath sounds normal. No respiratory distress. She has no wheezes. She has no rales. She exhibits no tenderness.  Only coughs once while here  Distant bs at bases  Otherwise good air exch Mild upper airway sounds  No rales or rhonchi or wheeze No prolonged exp phase   Abdominal: She exhibits no distension. There is no tenderness.  Soft and non tender abd while sitting   Musculoskeletal:  Significant kyphosis   Lymphadenopathy:    She has no cervical adenopathy.  Neurological: She is alert.   Skin: Skin is warm and dry. No rash noted.  Psychiatric: She has a normal mood and affect.  Baseline dementia Dozes at times  Follows directions  Seems content  Supportive family present           Assessment & Plan:   Problem List Items Addressed This Visit      Respiratory   URI (upper respiratory infection)    Worse than usual cough and elevated temp (low grade) several days ago  Re assuring exam  Not febrile  Using tessalon Enc fluids Rev note from [facility] but no xray report yet- will watch out for this Will update if symptoms worsen or do not improve in the next week         Other   Cough - Primary    Chronic cough - not responding to GERD tx or antihistamine  Some scarring and small pleural eff on her CT in aug  Ref to pulmonary for further eval      Relevant Orders   Ambulatory referral to Pulmonology    Other Visit Diagnoses   None.

## 2016-09-07 NOTE — Assessment & Plan Note (Signed)
Chronic cough - not responding to GERD tx or antihistamine  Some scarring and small pleural eff on her CT in aug  Ref to pulmonary for further eval

## 2016-09-07 NOTE — Assessment & Plan Note (Signed)
Worse than usual cough and elevated temp (low grade) several days ago  Re assuring exam  Not febrile  Using tessalon Enc fluids Rev note from [facility] but no xray report yet- will watch out for this Will update if symptoms worsen or do not improve in the next week

## 2016-09-07 NOTE — Progress Notes (Signed)
Pre visit review using our clinic review tool, if applicable. No additional management support is needed unless otherwise documented below in the visit note. 

## 2016-09-08 ENCOUNTER — Telehealth: Payer: Self-pay | Admitting: Family Medicine

## 2016-09-08 NOTE — Telephone Encounter (Signed)
When I try to open her cxr report in care everywhere-it won't open (as if it had not been read)- I am not in the office today- perhaps someone could call [facility] to request it ?   Thanks

## 2016-09-08 NOTE — Telephone Encounter (Signed)
Patient's sister,Jeanne,called to find out if patient's records from [facility] were received.  We haven't received them so far today.  Did the records come yesterday?  Patient has appointment with Dr.Wert on 09/30/16. When x-ray is received from [facility], Dr.Wert's office is asking for a copy to be faxed to [phone] ATTN: Dr.Wert's nurse.

## 2016-09-09 NOTE — Telephone Encounter (Signed)
Called [facility] and no answer so called Vaughan Basta (neice) and advise her of Dr. Marliss Coots comments. On xray results it says "let family know nothing new on cxr small pleural effusion, cc to pulmonary, please"  Xray given to Morey Hummingbird so she can stat scan results in EPIC so they pulmonologist will have results for her appt.

## 2016-09-19 ENCOUNTER — Other Ambulatory Visit: Payer: Self-pay | Admitting: Family Medicine

## 2016-09-27 ENCOUNTER — Encounter: Payer: Self-pay | Admitting: Podiatry

## 2016-09-27 ENCOUNTER — Ambulatory Visit (INDEPENDENT_AMBULATORY_CARE_PROVIDER_SITE_OTHER): Payer: Medicare Other | Admitting: Podiatry

## 2016-09-27 DIAGNOSIS — M79674 Pain in right toe(s): Secondary | ICD-10-CM | POA: Diagnosis not present

## 2016-09-27 DIAGNOSIS — B351 Tinea unguium: Secondary | ICD-10-CM

## 2016-09-27 DIAGNOSIS — M79675 Pain in left toe(s): Secondary | ICD-10-CM

## 2016-09-27 NOTE — Patient Instructions (Signed)
Diabetes and Foot Care Diabetes may cause you to have problems because of poor blood supply (circulation) to your feet and legs. This may cause the skin on your feet to become thinner, break easier, and heal more slowly. Your skin may become dry, and the skin may peel and crack. You may also have nerve damage in your legs and feet causing decreased feeling in them. You may not notice minor injuries to your feet that could lead to infections or more serious problems. Taking care of your feet is one of the most important things you can do for yourself.  HOME CARE INSTRUCTIONS  Wear shoes at all times, even in the house. Do not go barefoot. Bare feet are easily injured.  Check your feet daily for blisters, cuts, and redness. If you cannot see the bottom of your feet, use a mirror or ask someone for help.  Wash your feet with warm water (do not use hot water) and mild soap. Then pat your feet and the areas between your toes until they are completely dry. Do not soak your feet as this can dry your skin.  Apply a moisturizing lotion or petroleum jelly (that does not contain alcohol and is unscented) to the skin on your feet and to dry, brittle toenails. Do not apply lotion between your toes.  Trim your toenails straight across. Do not dig under them or around the cuticle. File the edges of your nails with an emery board or nail file.  Do not cut corns or calluses or try to remove them with medicine.  Wear clean socks or stockings every day. Make sure they are not too tight. Do not wear knee-high stockings since they may decrease blood flow to your legs.  Wear shoes that fit properly and have enough cushioning. To break in new shoes, wear them for just a few hours a day. This prevents you from injuring your feet. Always look in your shoes before you put them on to be sure there are no objects inside.  Do not cross your legs. This may decrease the blood flow to your feet.  If you find a minor scrape,  cut, or break in the skin on your feet, keep it and the skin around it clean and dry. These areas may be cleansed with mild soap and water. Do not cleanse the area with peroxide, alcohol, or iodine.  When you remove an adhesive bandage, be sure not to damage the skin around it.  If you have a wound, look at it several times a day to make sure it is healing.  Do not use heating pads or hot water bottles. They may burn your skin. If you have lost feeling in your feet or legs, you may not know it is happening until it is too late.  Make sure your health care provider performs a complete foot exam at least annually or more often if you have foot problems. Report any cuts, sores, or bruises to your health care provider immediately. SEEK MEDICAL CARE IF:   You have an injury that is not healing.  You have cuts or breaks in the skin.  You have an ingrown nail.  You notice redness on your legs or feet.  You feel burning or tingling in your legs or feet.  You have pain or cramps in your legs and feet.  Your legs or feet are numb.  Your feet always feel cold. SEEK IMMEDIATE MEDICAL CARE IF:   There is increasing redness,   swelling, or pain in or around a wound.  There is a red line that goes up your leg.  Pus is coming from a wound.  You develop a fever or as directed by your health care provider.  You notice a bad smell coming from an ulcer or wound.   This information is not intended to replace advice given to you by your health care provider. Make sure you discuss any questions you have with your health care provider.   Document Released: 12/02/2000 Document Revised: 08/07/2013 Document Reviewed: 05/14/2013 Elsevier Interactive Patient Education 2016 Elsevier Inc.  

## 2016-09-27 NOTE — Progress Notes (Signed)
Patient ID: [name], female   DOB: 04/06/1922, 82 y.o.   MRN: [id number]    Subjective: This patient presents for a scheduled visit with a complaint of thickened elongated toenails which are comfortable with direct shoe pressure and walking. Patient's caregiver is present to treatment room  Vascular: DP pulses 1/4 bilaterally PT pulses nonpalpable bilaterally Capillary reflex immediate bilaterally  Neurological: Ankle reflex weakly reactive bilaterally Patient not able to respond to 10 g monofilament wire vibratory sensation  Dermatological: Atrophic skin without any open wounds bilaterally The toenails are elongated, incurvated, deformed, hypertrophic and tender to direct palpation 6-10  Musculoskeletal: HAV deformity left Mallet toe second and third right  Assessment: Diabetic with decreased pedal pulses Symptomatic onychomycoses 6-10  Plan: Debridement of toenails 6-10 mechanically an electrical without any bleeding  Reappoint 4 months

## 2016-09-30 ENCOUNTER — Other Ambulatory Visit (INDEPENDENT_AMBULATORY_CARE_PROVIDER_SITE_OTHER): Payer: Medicare Other

## 2016-09-30 ENCOUNTER — Encounter: Payer: Self-pay | Admitting: Internal Medicine

## 2016-09-30 ENCOUNTER — Ambulatory Visit (INDEPENDENT_AMBULATORY_CARE_PROVIDER_SITE_OTHER): Payer: Medicare Other | Admitting: Internal Medicine

## 2016-09-30 VITALS — BP 124/62 | HR 89 | Ht 60.0 in | Wt 130.0 lb

## 2016-09-30 DIAGNOSIS — R0600 Dyspnea, unspecified: Secondary | ICD-10-CM | POA: Insufficient documentation

## 2016-09-30 DIAGNOSIS — R05 Cough: Secondary | ICD-10-CM | POA: Diagnosis not present

## 2016-09-30 DIAGNOSIS — R059 Cough, unspecified: Secondary | ICD-10-CM

## 2016-09-30 LAB — CBC WITH DIFFERENTIAL/PLATELET
BASOS PCT: 0.8 % (ref 0.0–3.0)
Basophils Absolute: 0.1 10*3/uL (ref 0.0–0.1)
EOS PCT: 2.5 % (ref 0.0–5.0)
Eosinophils Absolute: 0.2 10*3/uL (ref 0.0–0.7)
HCT: 34 % — ABNORMAL LOW (ref 36.0–46.0)
Hemoglobin: 10.9 g/dL — ABNORMAL LOW (ref 12.0–15.0)
LYMPHS ABS: 1.2 10*3/uL (ref 0.7–4.0)
Lymphocytes Relative: 12.9 % (ref 12.0–46.0)
MCHC: 32 g/dL (ref 30.0–36.0)
MCV: 82.9 fl (ref 78.0–100.0)
MONO ABS: 0.9 10*3/uL (ref 0.1–1.0)
Monocytes Relative: 9.1 % (ref 3.0–12.0)
NEUTROS ABS: 7 10*3/uL (ref 1.4–7.7)
NEUTROS PCT: 74.7 % (ref 43.0–77.0)
PLATELETS: 353 10*3/uL (ref 150.0–400.0)
RBC: 4.09 Mil/uL (ref 3.87–5.11)
RDW: 16.2 % — AB (ref 11.5–15.5)
WBC: 9.4 10*3/uL (ref 4.0–10.5)

## 2016-09-30 LAB — BRAIN NATRIURETIC PEPTIDE: PRO B NATRI PEPTIDE: 425 pg/mL — AB (ref 0.0–100.0)

## 2016-09-30 LAB — BASIC METABOLIC PANEL
BUN: 19 mg/dL (ref 6–23)
CHLORIDE: 103 meq/L (ref 96–112)
CO2: 26 mEq/L (ref 19–32)
CREATININE: 0.92 mg/dL (ref 0.40–1.20)
Calcium: 9.1 mg/dL (ref 8.4–10.5)
GFR: 60.67 mL/min (ref >60.00)
GLUCOSE: 157 mg/dL — AB (ref 70–99)
Potassium: 4.5 mEq/L (ref 3.5–5.1)
Sodium: 138 mEq/L (ref 135–145)

## 2016-09-30 LAB — TSH: TSH: 4.02 u[IU]/mL (ref 0.35–4.50)

## 2016-09-30 LAB — SEDIMENTATION RATE: SED RATE: 36 mm/h — AB (ref 0–30)

## 2016-09-30 NOTE — Patient Instructions (Addendum)
Pantoprazole (protonix) 40 mg   Take  30-60 min before first meal of the day and Pepcid (famotidine)  20 mg one @  bedtime until return to office - this is the best way to tell whether stomach acid is contributing to your problem.    GERD (REFLUX)  is an extremely common cause of respiratory symptoms just like yours , many times with no obvious heartburn at all.    It can be treated with medication, but also with lifestyle changes including elevation of the head of your bed (ideally with 6 inch  bed blocks),  Smoking cessation, avoidance of late meals, excessive alcohol, and avoid fatty foods, chocolate, peppermint, colas, red wine, and acidic juices such as orange juice.  NO MINT OR MENTHOL PRODUCTS SO NO COUGH DROPS   USE SUGARLESS CANDY INSTEAD (Jolley ranchers or Stover's or Life Savers) or even ice chips will also do - the key is to swallow to prevent all throat clearing. NO OIL BASED VITAMINS - use powdered substitutes.    For cough > delsym 2 tsp every 12 hours as needed if honey lemon doesn't work  Please remember to go to the lab  department downstairs for your tests - we will call you with the results when they are available.  Please schedule a follow up office visit in 4 weeks, sooner if needed with cxr on return  Late add:  bnp up > echo orderd

## 2016-09-30 NOTE — Progress Notes (Signed)
Subjective:    Patient ID: [name], female    DOB: 03-08-1922,    MRN: [id number]  HPI  67 yowf retired HS math teacher lives with sister and 23h/day help with new cough since around May 2017 x abn cxr referred to pulmonary clinic 09/30/2016 by Dr  Glori Bickers   09/30/2016 1st [facility] office visit/ [name]   Chief Complaint  Patient presents with  . Pulmonary Consult    Referred by Dr. Loura Pardon. Pt c/o cough x 6 wks. Cough is non prod and is esp worse at night and first thing in the am.   indolent onset persistent dry cough sporadic during day but much worse at hs   No obvious day to day or daytime variability or assoc excess/ purulent sputum or mucus plugs or hemoptysis or cp or chest tightness, subjective wheeze or overt sinus or hb symptoms. No unusual exp hx or h/o childhood pna/ asthma or knowledge of premature birth.  Sleeping ok without nocturnal  or early am exacerbation  of respiratory  c/o's or need for noct saba. Also denies any obvious fluctuation of symptoms with weather or environmental changes or other aggravating or alleviating factors except as outlined above   Current Medications, Allergies, Complete Past Medical History, Past Surgical History, Family History, and Social History were reviewed in [employer] record.        Review of Systems  Constitutional: Negative for chills, fever and unexpected weight change.  HENT: Positive for sneezing. Negative for congestion, dental problem, ear pain, nosebleeds, postnasal drip, rhinorrhea, sinus pressure, sore throat, trouble swallowing and voice change.   Eyes: Negative for visual disturbance.  Respiratory: Positive for cough. Negative for choking and shortness of breath.   Cardiovascular: Negative for chest pain and leg swelling.  Gastrointestinal: Negative for abdominal pain, diarrhea and vomiting.  Genitourinary: Negative for difficulty urinating.  Musculoskeletal: Negative for  arthralgias.  Skin: Negative for rash.  Neurological: Negative for tremors, syncope and headaches.  Hematological: Does not bruise/bleed easily.       Objective:   Physical Exam  Stoic w/c bound stoic wf lets her care takers do the talking, hesitates with sister's name but eventually gets it right   Wt Readings from Last 3 Encounters:  09/30/16 130 lb (59 kg)  07/13/16 130 lb 8 oz (59.2 kg)  01/01/16 130 lb 8 oz (59.2 kg)    Vital signs reviewed   HEENT: nl dentition, turbinates, and oropharynx. Nl external ear canals without cough reflex   NECK :  without JVD/Nodes/TM/ nl carotid upstrokes bilaterally   LUNGS: no acc muscle use,  Nl contour chest which is clear to A and P bilaterally without cough on insp or exp maneuvers   CV:  RRR  no s3 or murmur or increase in P2, no edema   ABD:  soft and nontender with nl inspiratory excursion in the supine position. No bruits or organomegaly, bowel sounds nl  MS:  Nl gait/ ext warm without deformities, calf tenderness, cyanosis or clubbing No obvious joint restrictions   SKIN: warm and dry without lesions    NEURO:  alert, approp, nl sensorium with  no motor deficits       I personally reviewed images and agree with radiology impression as follows:  CXR:   09/06/16  Bilateral pl effusions/ severe kyphosis   I personally reviewed images and agree with radiology impression as follows:  CT w/o  Chest 07/22/16  Small bilateral pleural effusions,  right greater than left. Right base atelectasis. Lingular and right middle lobe scarring.  Borderline enlarged subcarinal lymph nodes, presumably reactive.  Moderate-sized hiatal hernia.  Cardiomegaly, aortic atherosclerosis.   Labs ordered/ reviewed:      Chemistry      Component Value Date/Time   NA 138 09/30/2016 1123   K 4.5 09/30/2016 1123   CL 103 09/30/2016 1123   CO2 26 09/30/2016 1123   BUN 19 09/30/2016 1123   CREATININE 0.92 09/30/2016 1123        Component Value Date/Time   CALCIUM 9.1 09/30/2016 1123   ALKPHOS 70 07/07/2016 0839   AST 15 07/07/2016 0839   ALT 10 07/07/2016 0839   BILITOT 0.6 07/07/2016 0839        Lab Results  Component Value Date   WBC 9.4 09/30/2016   HGB 10.9 (L) 09/30/2016   HCT 34.0 (L) 09/30/2016   MCV 82.9 09/30/2016   PLT 353.0 09/30/2016        Lab Results  Component Value Date   TSH 4.02 09/30/2016     Lab Results  Component Value Date   PROBNP 425.0 (H) 09/30/2016       Lab Results  Component Value Date   ESRSEDRATE 36 (H) 09/30/2016   ESRSEDRATE 15 03/14/2008                 Assessment & Plan:

## 2016-10-03 ENCOUNTER — Other Ambulatory Visit: Payer: Self-pay | Admitting: Internal Medicine

## 2016-10-03 DIAGNOSIS — R0602 Shortness of breath: Secondary | ICD-10-CM

## 2016-10-03 NOTE — Progress Notes (Signed)
Spoke with pt's sister and notified of results per Dr. Wert. She verbalized understanding and denied any questions. 

## 2016-10-04 ENCOUNTER — Other Ambulatory Visit: Payer: Self-pay | Admitting: Family Medicine

## 2016-10-05 ENCOUNTER — Ambulatory Visit (HOSPITAL_COMMUNITY)
Admission: RE | Admit: 2016-10-05 | Discharge: 2016-10-05 | Disposition: A | Discharge status: Home / Self Care | Payer: Medicare Other | Source: Ambulatory Visit | Attending: Internal Medicine | Admitting: Internal Medicine

## 2016-10-05 ENCOUNTER — Other Ambulatory Visit: Payer: Self-pay | Admitting: Internal Medicine

## 2016-10-05 ENCOUNTER — Encounter: Payer: Self-pay | Admitting: Internal Medicine

## 2016-10-05 DIAGNOSIS — R0602 Shortness of breath: Secondary | ICD-10-CM | POA: Insufficient documentation

## 2016-10-05 DIAGNOSIS — I340 Nonrheumatic mitral (valve) insufficiency: Secondary | ICD-10-CM | POA: Insufficient documentation

## 2016-10-05 DIAGNOSIS — R931 Abnormal findings on diagnostic imaging of heart and coronary circulation: Secondary | ICD-10-CM | POA: Diagnosis not present

## 2016-10-05 MED ORDER — VALSARTAN-HYDROCHLOROTHIAZIDE 80-12.5 MG PO TABS: 1.0000 | ORAL_TABLET | Freq: Every day | ORAL | 0 refills | Status: DC

## 2016-10-05 NOTE — Assessment & Plan Note (Signed)
Onset May 2017 worse at hs     Assoc with bilateral pl effusions and noct worsening moderate elevation of bnp > echo is next step  Total time devoted to counseling  = 35/41m review case with pt/ discussion of options/alternatives/ personally creating written instructions  in presence of pt  then going over those specific  Instructions directly with the pt including how to use all of the meds but in particular covering each new medication in detail and the difference between the maintenance/automatic meds and the prns using an action plan format for the latter.

## 2016-10-05 NOTE — Progress Notes (Signed)
LMTCB for [name], the pt's daughter on number listed as home number  Note that the [phone] number is incorrect

## 2016-10-05 NOTE — Progress Notes (Signed)
  Echocardiogram 2D Echocardiogram has been performed.  [name] 10/05/2016, 10:30 AM

## 2016-10-05 NOTE — Assessment & Plan Note (Signed)
Probably this is cardiac asthma / cough and or component of Upper airway cough syndrome (previously labeled PNDS) , is  so named because it's frequently impossible to sort out how much is  CR/sinusitis with freq throat clearing (which can be related to primary GERD)   vs  causing  secondary (" extra esophageal")  GERD from wide swings in gastric pressure that occur with throat clearing, often  promoting self use of mint and menthol lozenges that reduce the lower esophageal sphincter tone and exacerbate the problem further in a cyclical fashion.   These are the same pts (now being labeled as having "irritable larynx syndrome" by some cough centers) who not infrequently have a history of having failed to tolerate ace inhibitors,  dry powder inhalers or biphosphonates or report having atypical/extraesophageal reflux symptoms that don't respond to standard doses of PPI  and are easily confused as having aecopd or asthma flares by even experienced allergists/ pulmonologists (myself included).   [name] has a significant HH on CT so rec max gerd Rx and then regroup

## 2016-10-05 NOTE — Progress Notes (Signed)
Spoke with the pt's sister, Sharyn Lull and notified of results/recs and she verbalized understanding

## 2016-10-06 ENCOUNTER — Telehealth: Payer: Self-pay | Admitting: Internal Medicine

## 2016-10-06 NOTE — Telephone Encounter (Signed)
I have never seen this side effect with this particular arb (there is another in this class that rarely does it, that's why it's listed as a class effect)  so would stay on it unless condition worsens until returns as symptoms should gradually improve on it

## 2016-10-06 NOTE — Telephone Encounter (Signed)
Spoke with Vaughan Basta. She is aware of MW's response. Nothing further was needed.

## 2016-10-06 NOTE — Telephone Encounter (Signed)
Spoke with pt's niece, Vaughan Basta. States that they have some concerns about the side effects of Valsartan >> SOB and chronic cough. They state that pt already has these 2 symptoms before even starting the medication. Vaughan Basta wants to know what kind of symptoms should they be looking for after the pt starts this medication.  MW - please advise. Thanks.

## 2016-10-17 ENCOUNTER — Telehealth: Payer: Self-pay | Admitting: Internal Medicine

## 2016-10-17 NOTE — Telephone Encounter (Signed)
Yes cut valsartan in half and just one half daily  Be sure sugars are ok  Keep f/u appt - to er in meantime if worse

## 2016-10-17 NOTE — Telephone Encounter (Signed)
Called and spoke with [name] and she stated that the for the last couple of days the pt has been kinda out of it.  She has not been feeding herself, sleeping all the time.  [name] stated that she is more alert this am.  Her BP readings from yesterday and this am are: 10/29 255--93/57 315--98/58 430--107/68 10/30 109/60 and her HR has been between 91-93.   They are wanting to know if they should give her half the dose and should this be given to her at night?    They have a pending appt on Thursday/  MW please advise. thanks

## 2016-10-17 NOTE — Telephone Encounter (Signed)
Called and spoke with linda and she is aware to cut the valsartan in half and they will keep the appt on Thursday.

## 2016-10-20 ENCOUNTER — Encounter: Payer: Self-pay | Admitting: Acute Care

## 2016-10-20 ENCOUNTER — Other Ambulatory Visit (INDEPENDENT_AMBULATORY_CARE_PROVIDER_SITE_OTHER): Payer: Medicare Other

## 2016-10-20 ENCOUNTER — Ambulatory Visit (INDEPENDENT_AMBULATORY_CARE_PROVIDER_SITE_OTHER): Payer: Medicare Other | Admitting: Acute Care

## 2016-10-20 VITALS — BP 98/54 | HR 93 | Ht 60.0 in | Wt 125.0 lb

## 2016-10-20 DIAGNOSIS — I5020 Unspecified systolic (congestive) heart failure: Secondary | ICD-10-CM | POA: Diagnosis not present

## 2016-10-20 DIAGNOSIS — R05 Cough: Secondary | ICD-10-CM

## 2016-10-20 DIAGNOSIS — R059 Cough, unspecified: Secondary | ICD-10-CM

## 2016-10-20 DIAGNOSIS — I509 Heart failure, unspecified: Secondary | ICD-10-CM

## 2016-10-20 LAB — BASIC METABOLIC PANEL
BUN: 28 mg/dL — ABNORMAL HIGH (ref 6–23)
CALCIUM: 9.5 mg/dL (ref 8.4–10.5)
CHLORIDE: 100 meq/L (ref 96–112)
CO2: 24 meq/L (ref 19–32)
Creatinine, Ser: 1.1 mg/dL (ref 0.40–1.20)
GFR: 49.36 mL/min — ABNORMAL LOW (ref >60.00)
Glucose, Bld: 124 mg/dL — ABNORMAL HIGH (ref 70–99)
POTASSIUM: 4.9 meq/L (ref 3.5–5.1)
SODIUM: 134 meq/L — AB (ref 135–145)

## 2016-10-20 LAB — SEDIMENTATION RATE: Sed Rate: 86 mm/hr — ABNORMAL HIGH (ref 0–30)

## 2016-10-20 NOTE — Progress Notes (Signed)
History of Present Illness [name] is a 82 y.o. female referred  To pulmonary for non productive cough and dyspnea  followed by Dr. Melvyn Novas.  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 She does have history of Alzheimer's Disease  . She is cared for by her sister, niece and a caregiver.  10/20/2016 Follow Up OV: Pt. Presents for follow up of  cough. She was seen by Dr. Melvyn Novas 10/13 who evaluated labs and noted an elevated Pro  BNP of 425. Echo was ordered ;Study is c/w component of fluid overload from ht .  Diovan 80- 12.5 one daily was started. The family noted on 10/19 that the patient was more lethargic and weak.They called the office, and were told to monitor her and call if she did not improve.They called the office again on 10/30 with no improvement, and were told to decrease the dose to 1/2 tablet once daily. They started the  half tablet daily  on 10/30, which did not improve her weakness or altered mental status.Per her family she is less responsive than her baseline.  She is here today for follow up. She has not been doing well on the half dose of  Valsartan. Family has noted  she has been weak and much less alert. .Family and care givers have also noted  less urine output. This is concerning as she is also taking Metformin for DM. Blood Pressure in the office today is low. She does not appear dehydrated on exam, mucous membranes and pink and moist.  Of note, She was also started on max GERD treatment for [facility] on CT on 09/30/2016.  We discussed referral to cardiology today. The patient did not tolerate even low dose therapy with Valsartan. The family are in agreement that this is appropriate. I spoke with the patients sister and niece who state that they desire non-aggressive care for Krystalle that is more comfort focused than invasive diagnostic testing focused.    [name] does focus and track and she nodded appropriately when I kneeled and asked her a few yes and no questions.She told me ok when I asked to listen to her lungs.  This visit was staffed by Dr. Melvyn Novas.  Tests: Echo 10/05/2016 Study Conclusions  - Left ventricle: The cavity size was normal. Wall thickness was   increased in a pattern of mild LVH. Systolic function was   severely reduced. The estimated ejection fraction was in the   range of 25% to 30%. Diffuse hypokinesis. - Mitral valve: Calcified annulus. Mildly thickened leaflets .   There was mild regurgitation. - Left atrium: The atrium was moderately dilated.  Impressions:  - Severe global reduction in LV function; mild LVH; moderate LAE;   mild MR.  CXR 09/06/2016: CXR:   09/06/16  Bilateral pl effusions/ severe kyphosis  CT w/o  Chest 07/22/16  Small bilateral pleural effusions, right greater than left. Right base atelectasis. Lingular and right middle lobe scarring.  Borderline enlarged subcarinal lymph nodes, presumably reactive.  Moderate-sized hiatal hernia.  Cardiomegaly, aortic atherosclerosis.              Past medical hx Past Medical History:  Diagnosis Date  . Abnormal CT of the chest 10/08   5 mm nodule- needs f/u CT in 1 year  . Alzheimer disease    "severe" (08/08/2012)  .  Depression    type 2  . DM type 2 (diabetes mellitus, type 2) ([place])   . Dysrhythmia    "irregular at times"  . GERD (gastroesophageal reflux disease)   . Hyperlipidemia   . Memory loss, short term 3/09   mild cognitive impairment  . Osteopenia   . Squamous cell carcinoma of leg      Past surgical hx, Family hx, Social hx all reviewed.  Current Outpatient Prescriptions on File Prior to Visit  Medication Sig  . aspirin 81 MG tablet Take 81 mg by mouth daily.  Marland Kitchen atorvastatin (LIPITOR) 10 MG tablet TAKE ONE TABLET BY MOUTH EVERY NIGHT AT BEDTIME  . calcitonin, salmon, (MIACALCIN/FORTICAL) 200 UNIT/ACT nasal  spray USE ONE SPRAY IN EACH NOSTRIL DAILY *ALTERNATING NOSTRILS  . donepezil (ARICEPT) 5 MG tablet Take 1 tablet (5 mg total) by mouth at bedtime.  Marland Kitchen glucose blood test strip Use to check blood sugar daily and as directed. Dx 250.00  . loratadine (CLARITIN) 10 MG tablet Take 10 mg by mouth daily.  . memantine (NAMENDA XR) 28 MG CP24 24 hr capsule Take 1 capsule (28 mg total) by mouth daily.  . metFORMIN (GLUCOPHAGE) 500 MG tablet TAKE 1 TABLET BY MOUTH TWICE A DAY WITH MEALS  . pantoprazole (PROTONIX) 40 MG tablet TAKE ONE TABLET BY MOUTH EACH MORNING  . valsartan-hydrochlorothiazide (DIOVAN HCT) 80-12.5 MG tablet Take 1 tablet by mouth daily. (Patient taking differently: Take 1 tablet by mouth daily. Pt. Takes 1/2 tablet by mouth daily)   No current facility-administered medications on file prior to visit.      Allergies  Allergen Reactions  . Alendronate Sodium     REACTION: GI; "don't really remember reaction"  . Risedronate Sodium     REACTION: GI; "don't really remember reaction"    Family members are answering questions for the patient today  Review Of Systems:  Constitutional:   No  weight loss, night sweats,  Fevers, chills,+ fatigue, or  Lassitude, Less alert and engaged than baseline.  HEENT:   No headaches,  Difficulty swallowing,  Tooth/dental problems, or  Sore throat,                No sneezing, itching, ear ache, nasal congestion, post nasal drip,   CV:  No chest pain,  Orthopnea, PND, swelling in lower extremities, anasarca, dizziness, palpitations, syncope.   GI  No heartburn, indigestion, abdominal pain, nausea, vomiting, diarrhea, change in bowel habits, loss of appetite, bloody stools.   Resp: No shortness of breath with exertion or at rest.  No excess mucus, no productive cough,  + non-productive cough,but improved,  No coughing up of blood.  No change in color of mucus.  No wheezing.  No chest wall deformity  Skin: no rash or lesions.  GU: no dysuria, change  in color of urine, no urgency or frequency.  No flank pain, no hematuria   MS:  No joint pain or swelling.  No decreased range of motion.  No back pain.   Psych:  No change in mood or affect. No depression or anxiety.  No memory loss.   Vital Signs BP (!) 98/54 (BP Location: Left Arm, Patient Position: Sitting, Cuff Size: Normal)   Pulse 93   Ht 5' (1.524 m)   Wt 125 lb (56.7 kg)   SpO2 96%   BMI 24.41 kg/m   Note sats are on RA    Physical Exam:  General- No distress,  A&Ox3, frail in WC,  severe kyphosis/ very little interaction but does follow commands like stick out your tongue /oral mucosa is not dry  ENT: No sinus tenderness, TM clear, pale nasal mucosa, no oral exudate,no post nasal drip, no LAN Cardiac: S1, S2, regular rate and rhythm, no murmur Chest: No wheeze/  Fine rales per bases/ dullness; no accessory muscle use, no nasal flaring, no sternal retractions Abd.: Soft Non-tender Ext: No clubbing cyanosis, edema Neuro:  Alert, nods to yes / no questions, MAE x 4, deconditioned Skin: No rashes, warm and dry Psych:Flat Affect   Assessment/Plan  Congestive heart failure (HCC) Heart Failure per ECHO 10/05/2016 Intolerant of  Low dose Diovan started 10/05/2016 by Dr. Melvyn Novas Plan: We will check labs today,:( BMET and sed rate) Stop the  Valsartan  completely We will refer you to cardiology. Follow up with Dr. Melvyn Novas in 1 week to make sure you are improving. Please contact office for sooner follow up if symptoms do not improve or worsen or seek emergency care .      Magdalen Spatz, NP 10/20/2016  2:06 PM     Chemistry      Component Value Date/Time   NA 134 (L) 10/20/2016 1111   K 4.9 10/20/2016 1111   CL 100 10/20/2016 1111   CO2 24 10/20/2016 1111   BUN 28 (H) 10/20/2016 1111   CREATININE 1.10 10/20/2016 1111      Component Value Date/Time   CALCIUM 9.5 10/20/2016 1111   ALKPHOS 70 07/07/2016 0839   AST 15 07/07/2016 0839   ALT 10 07/07/2016 0839    BILITOT 0.6 07/07/2016 0839         Lab Results  Component Value Date   ESRSEDRATE 86 (H) 10/20/2016   ESRSEDRATE 36 (H) 09/30/2016   ESRSEDRATE 15 03/14/2008     Pt seen and independently examined with hx confirmed as above. She has newly dx'd chf but tol the diovan poorly even in the lowers avail dose divided in half and no overt pulmonary edema or peripheral edema so rec d/c diovan, set up for cards as outlined.  To er if po intake not improving but no evidence of significant dehydration at this point  Not clear why ESR rapidly climbing but will need close f/u by [facility] as well > advised   Christinia Gully, MD Pulmonary and [place] [phone] After 5:30 PM or weekends, call [phone]

## 2016-10-20 NOTE — Progress Notes (Signed)
Cardiology Office Note    Date:  10/24/2016   ID:  [name], DOB May 27, 1922, MRN [id number]  PCP:  Alexandria Pardon, MD  Cardiologist:  New- seen by Dr. Julianne Donaldson today  CC: newly diagnosed systolic CHF  History of Present Illness:  [name] is a 82 y.o. female with a history of Alzheimers dz, DMT2, HLD, and newly diagnosed systolic CHF who presents to clinic for new evaluation  She was seen by Dr. Melvyn Donaldson 10/13 for cough and dyspnea ( PCP had ordered CXR which showed bilateral pleural effusions). Pro  BNP of 425. Echo was ordered which showed EF 25-30% (normal in 2013 during an admission for syncope. She as started on Diovan 80-2.5 one daily was started. The family noted on 10/19 that the patient was more lethargic and weak. They called the office, and were told to monitor her and call if she did not improve .They called the office again on 10/30 with no improvement, and were told to decrease the dose to 1/2 tablet once daily. They started the  half tablet daily  on 10/30, which did not improve her weakness or altered mental status.  She was seen by [name] on 10/20/16. She did not tolerate the Diovan at all and it was discontinued. She and her family expressed desire for non aggressive treatment. Today she presents to clinic for new cardiology evaluation.   She is accompanied by her sister, niece, and 24 hour care giver. She is in a wheelchair and gives only limited answers. She says she is not particularly short of breath but her family says she coughs a lot of they can tell her she Is more dyspneic than at baseline. No CP. No LE edema, orthopnea or PND. She requires help with all her ADLs but recently when she was on the Diovan she was completely incapacitated and couldn't help at all. Per family goals are to make her comfortable and they are looking to pursue further invasive work up.    Past Medical History:  Diagnosis Date  . Abnormal CT of the chest 10/08   5 mm nodule-  needs f/u CT in 1 year  . Alzheimer disease    "severe" (08/08/2012)  . Depression    type 2  . DM type 2 (diabetes mellitus, type 2) ([place])   . Dysrhythmia    "irregular at times"  . GERD (gastroesophageal reflux disease)   . Hyperlipidemia   . Memory loss, short term 3/09   mild cognitive impairment  . Osteopenia   . Squamous cell carcinoma of leg     Past Surgical History:  Procedure Laterality Date  . BLADDER SUSPENSION  02/2001   pelvic vaginal sling for incontinence  . CATARACT EXTRACTION, BILATERAL  2010  . Dexa  11/01   osteopenia  . ischemic colitis  10/98   colonoscopy  . PELVIC FRACTURE SURGERY  07/2002   after a fall  . SQUAMOUS CELL CARCINOMA EXCISION  10/2001   on leg  . TOTAL HIP ARTHROPLASTY  ? first OR; 02/2005   right X 2  . VAGINAL HYSTERECTOMY  1972    Current Medications: Outpatient Medications Prior to Visit  Medication Sig Dispense Refill  . aspirin 81 MG tablet Take 81 mg by mouth daily.    Marland Kitchen atorvastatin (LIPITOR) 10 MG tablet TAKE ONE TABLET BY MOUTH EVERY NIGHT AT BEDTIME 90 tablet 1  . calcitonin, salmon, (MIACALCIN/FORTICAL) 200 UNIT/ACT nasal spray USE ONE SPRAY IN EACH NOSTRIL  DAILY *ALTERNATING NOSTRILS 11.1 mL 1  . donepezil (ARICEPT) 5 MG tablet Take 1 tablet (5 mg total) by mouth at bedtime. 90 tablet 3  . glucose blood test strip Use to check blood sugar daily and as directed. Dx 250.00 100 each 6  . loratadine (CLARITIN) 10 MG tablet Take 10 mg by mouth daily.    . memantine (NAMENDA XR) 28 MG CP24 24 hr capsule Take 1 capsule (28 mg total) by mouth daily. 90 capsule 3  . metFORMIN (GLUCOPHAGE) 500 MG tablet TAKE 1 TABLET BY MOUTH TWICE A DAY WITH MEALS 180 tablet 1  . pantoprazole (PROTONIX) 40 MG tablet TAKE ONE TABLET BY MOUTH EACH MORNING 90 tablet 1  . valsartan-hydrochlorothiazide (DIOVAN HCT) 80-12.5 MG tablet Take 1 tablet by mouth daily. (Patient taking differently: Take 1 tablet by mouth daily. Pt. Takes 1/2 tablet by mouth  daily) 30 tablet 0   No facility-administered medications prior to visit.      Allergies:   Alendronate sodium; Cephalexin; Risedronate sodium; and Valsartan-hydrochlorothiazide   Social History   Social History  . Marital status: Single    Spouse name: N/A  . Number of children: 0  . Years of education: college   Occupational History  . Retired Education officer, [employer]    Social History Main Topics  . Smoking status: Never Smoker  . Smokeless tobacco: Never Used  . Alcohol use No  . Drug use: No  . Sexual activity: Not on file   Other Topics Concern  . Not on file   Social History Narrative   Lives with her sister [name]) who helps care for her (and another sister).   Involved in church activities.     Family History:  The patient's family history includes Alzheimer's disease in her sister; Breast cancer in her other, sister, and sister; Dementia in her mother; Memory loss in her sister and sister; Prostate cancer in her brother; Stroke in her father and mother.     ROS:   Please see the history of present illness.    ROS All other systems reviewed and are negative.   PHYSICAL EXAM:   VS:  BP 128/72   Pulse (!) 103   Ht 5' (1.524 m)   Wt 126 lb (57.2 kg)   BMI 24.61 kg/m    GEN: Well nourished, well developed, in no acute distress, elderly and frail  HEENT: normal  Neck: no JVD, carotid bruits, or masses Cardiac: RR, rachy; no murmurs, rubs, or gallops,trace bilateral LE edema  Respiratory:  clear to auscultation bilaterally, normal work of breathing GI: soft, nontender, nondistended, + BS MS: no deformity or atrophy  Skin: warm and dry, no rash Neuro:  Alert and Oriented x 3, Strength and sensation are intact Psych: euthymic mood, full affect  Wt Readings from Last 3 Encounters:  10/24/16 126 lb (57.2 kg)  10/20/16 125 lb (56.7 kg)  09/30/16 130 lb (59 kg)      Studies/Labs Reviewed:   EKG:  EKG is ordered today.  The ekg ordered today demonstrates  sinus tachy HR 103, TWI in inferior leads.   Recent Labs: 07/07/2016: ALT 10 09/30/2016: Hemoglobin 10.9; Platelets 353.0; Pro B Natriuretic peptide (BNP) 425.0; TSH 4.02 10/20/2016: BUN 28; Creatinine, Ser 1.10; Potassium 4.9; Sodium 134   Lipid Panel    Component Value Date/Time   CHOL 125 07/07/2016 0839   TRIG 60.0 07/07/2016 0839   HDL 50.40 07/07/2016 0839   CHOLHDL 2 07/07/2016 [id number]  VLDL 12.0 07/07/2016 0839   LDLCALC 63 07/07/2016 0839    Additional studies/ records that were reviewed today include:  2D ECHO: 10/05/2016 LV EF: 25% -   30% Study Conclusions - Left ventricle: The cavity size was normal. Wall thickness was   increased in a pattern of mild LVH. Systolic function was   severely reduced. The estimated ejection fraction was in the   range of 25% to 30%. Diffuse hypokinesis. - Mitral valve: Calcified annulus. Mildly thickened leaflets .   There was mild regurgitation. - Left atrium: The atrium was moderately dilated. Impressions: - Severe global reduction in LV function; mild LVH; moderate LAE;   mild MR.    ASSESSMENT & PLAN:   Newly diagnosed acute systolic CHF: EF [id number] with diffuse HK. Given her age and dementia and personal/family wishes for palliative measures, no further evaluation will be undertaken. She did not tolerate Diovan. Will trial lasix 20mg  daily to make her more comfortable. We have placed a call to [facility] to obtain a palliative care consult. Dr. Julianne Donaldson saw and assessed the patient with me. He is in agreement with this plan. They will call us if she does not tolerate the lasix.  HLD: LDL 63. Continue atorvastatin.   DMT2: HgA1c 6.5 in 06/2016    Medication Adjustments/Labs and Tests Ordered: Current medicines are reviewed at length with the patient today.  Concerns regarding medicines are outlined above.  Medication changes, Labs and Tests ordered today are listed in the Patient Instructions below. Patient  Instructions  Medication Instructions:  Your physician has recommended you make the following change in your medication:  1-START taking lasix 20 mg by mouth daily.  Lab work: NONE  Testing/Procedures: NONE  Follow-Up: Your physician wants you to follow-up in: 3 months with Dr. Angelena Form.  If you need to be seen back earlier, you can call and schedule to see Nell Range PA. [phone].  You have been referred to Palliative Care, they will call you to set up an appointment. If you do not hear from them in the next couple of days please call our office. [phone].   If you need a refill on your cardiac medications before your next appointment, please call your pharmacy.       Signed, Angelena Form, PA-C  10/24/2016 11:54 AM    [facility] [place], [place], [place]  [address] Phone: [phone]; Fax: [phone]

## 2016-10-20 NOTE — Assessment & Plan Note (Addendum)
Heart Failure per ECHO 10/05/2016 Intolerant of  Low dose Diovan started 10/05/2016 by Dr. Melvyn Novas Plan: We will check labs today,:( BMET and sed rate) Stop the  Valsartan  completely We will refer you to cardiology. Follow up with Dr. Melvyn Novas in 1 week to make sure you are improving. Please contact office for sooner follow up if symptoms do not improve or worsen or seek emergency care .

## 2016-10-20 NOTE — Patient Instructions (Addendum)
It is good to see you today. We will check labs today,:( BMET and sed rate) Stop the  Valsartan  completely We will refer you to cardiology. Follow up with Dr. Melvyn Novas in 1 week to make sure you are improving. Please contact office for sooner follow up if symptoms do not improve or worsen or seek emergency care .

## 2016-10-21 ENCOUNTER — Telehealth: Payer: Self-pay | Admitting: Acute Care

## 2016-10-21 ENCOUNTER — Telehealth: Payer: Self-pay | Admitting: Internal Medicine

## 2016-10-21 NOTE — Telephone Encounter (Signed)
Please call the family and let them know that Dr. Melvyn Novas reviewed her labs that were drawn yesterday. They indicate that she is slightly dehydrated. Please tell them them to hydrate and go to your cardiology appointment on Monday.

## 2016-10-21 NOTE — Telephone Encounter (Signed)
SG  pts family is calling looking for the lab results from yesterday.  Please advise. thanks

## 2016-10-21 NOTE — Telephone Encounter (Signed)
Will forward to SG to make her aware.

## 2016-10-21 NOTE — Telephone Encounter (Signed)
Called and spoke with pts niece and she is aware of lab results and to keep the cardiology appt for Monday.

## 2016-10-24 ENCOUNTER — Encounter (INDEPENDENT_AMBULATORY_CARE_PROVIDER_SITE_OTHER): Payer: Self-pay

## 2016-10-24 ENCOUNTER — Ambulatory Visit (INDEPENDENT_AMBULATORY_CARE_PROVIDER_SITE_OTHER): Payer: Medicare Other | Admitting: Physician Assistant

## 2016-10-24 VITALS — BP 128/72 | HR 103 | Ht 60.0 in | Wt 126.0 lb

## 2016-10-24 DIAGNOSIS — E118 Type 2 diabetes mellitus with unspecified complications: Secondary | ICD-10-CM

## 2016-10-24 DIAGNOSIS — E785 Hyperlipidemia, unspecified: Secondary | ICD-10-CM

## 2016-10-24 DIAGNOSIS — I5021 Acute systolic (congestive) heart failure: Secondary | ICD-10-CM

## 2016-10-24 DIAGNOSIS — Z515 Encounter for palliative care: Secondary | ICD-10-CM | POA: Diagnosis not present

## 2016-10-24 DIAGNOSIS — I10 Essential (primary) hypertension: Secondary | ICD-10-CM

## 2016-10-24 MED ORDER — FUROSEMIDE 20 MG PO TABS: 20.0000 mg | ORAL_TABLET | Freq: Every day | ORAL | 3 refills | Status: AC

## 2016-10-24 NOTE — Patient Instructions (Addendum)
Medication Instructions:  Your physician has recommended you make the following change in your medication:  1-START taking lasix 20 mg by mouth daily.  Lab work: NONE  Testing/Procedures: NONE  Follow-Up: Your physician wants you to follow-up in: 3 months with Dr. Angelena Form.  If you need to be seen back earlier, you can call and schedule to see Nell Range PA. [phone].  You have been referred to Palliative Care, they will call you to set up an appointment. If you do not hear from them in the next couple of days please call our office. [phone].   If you need a refill on your cardiac medications before your next appointment, please call your pharmacy.

## 2016-10-26 ENCOUNTER — Telehealth: Payer: Self-pay | Admitting: Cardiovascular Disease

## 2016-10-26 ENCOUNTER — Telehealth: Payer: Self-pay | Admitting: Internal Medicine

## 2016-10-26 NOTE — Telephone Encounter (Signed)
This is a really difficult question and not sure I have a good answer. I think the goal for involving palliative care was just to establish goals and make sure we are doing everything possible to make [name] comfortable. It is more a mindset than anything. Studies have actually shown that some people actually live longer once hospice/palliative care is involved! It seemed like we were all on the same page that life prolonging measures and invasive work up with not appropriate at this time.

## 2016-10-26 NOTE — Telephone Encounter (Signed)
Pt niece aware of below message.  Both appt have been canceled. Nothing further needed.

## 2016-10-26 NOTE — Telephone Encounter (Signed)
Ok to cancel/ follow up here is prn

## 2016-10-26 NOTE — Telephone Encounter (Signed)
Spoke with pt niece who states at pt acute visit with SG on 10-20-16, SG ref pt to cardiology and wanted pt to f/u with MW in one week. Vaughan Basta is wanting to know if this appointment is necessary as pt has already seen cardiology and was started on lasix. Vaughan Basta states pt breathing has improved. Pt is scheduled for f/u with MW on 11-03-16.  MW please advise if pt should come in tomorrow or if it's okay to wait until the 16th. Thanks.

## 2016-10-26 NOTE — Telephone Encounter (Signed)
New Message  Pts niece wants to know if they need to prepare for the pt to expire based on the results that were given to them recently during Ov.  Pts niece voiced pts sister wants to know if so due to family Hx.  Please f/u

## 2016-10-26 NOTE — Telephone Encounter (Signed)
I spoke with pt's niece. She is calling to follow up on office visit with K. Grandville Silos, PA earlier this week.  Niece is asking about pt's prognosis due to her age, Alzheimer's disease and heart condition.  Niece states she knows there is no way to predict when pt will die but she is asking if doctors feel it will be soon or possibly as long as a year.  They are waiting to hear from Palliative care.  Will forward to K. Grandville Silos, [place] and Dr. Angelena Form.  Niece aware K. Grandville Silos is out of office the rest of this week and Dr. Angelena Form is off this afternoon.

## 2016-10-27 ENCOUNTER — Ambulatory Visit: Payer: Medicare Other | Admitting: Internal Medicine

## 2016-10-27 NOTE — Telephone Encounter (Signed)
I agree. Can we let them know that there is no way to know for sure when she will begin to do more poorly but getting palliative care involved is a great way to make her final days more tolerable and set up reasonable goals for comfort.   Thanks, chris

## 2016-10-27 NOTE — Telephone Encounter (Signed)
I spoke to pt's niece, [name] (on [place]) in regards to Dr. Henri Medal. [place]'s response to her questions about life expectancy.  She was very receptive and understanding to their input. She states palliative care will be in the home today at 1000.  I advised her they would be more able to answer questions in regards to their services than [place].  I advised if there are further questions we would be glad to help.  She voiced understanding, states our office has been wonderful throughout this whole "thing".  She voiced thanks again.

## 2016-10-27 NOTE — Telephone Encounter (Signed)
Thanks

## 2016-11-03 ENCOUNTER — Ambulatory Visit: Payer: Medicare Other | Admitting: Internal Medicine

## 2016-11-30 ENCOUNTER — Encounter: Payer: Self-pay | Admitting: Neurology

## 2016-11-30 ENCOUNTER — Ambulatory Visit (INDEPENDENT_AMBULATORY_CARE_PROVIDER_SITE_OTHER): Payer: Medicare Other | Admitting: Neurology

## 2016-11-30 VITALS — BP 114/62 | HR 103

## 2016-11-30 DIAGNOSIS — F0390 Unspecified dementia without behavioral disturbance: Secondary | ICD-10-CM

## 2016-11-30 DIAGNOSIS — F03C0 Unspecified dementia, severe, without behavioral disturbance, psychotic disturbance, mood disturbance, and anxiety: Secondary | ICD-10-CM

## 2016-11-30 NOTE — Patient Instructions (Signed)
I agree with getting her plugged in with palliative care. If the palliative care doctor suggests taking [name] off the memory medications, you can do that.  I would say, that over the past 18 months, she has had a decline in her memory function.  Please ensure good nutrition and oral fluid intake, be proactive with constipation issues. I will see her back as needed at this point.

## 2016-11-30 NOTE — Progress Notes (Signed)
Subjective:    Patient ID: [name] is a 82 y.o. female.  HPI     Interim history:   [name] is a very pleasant 82 year old right-handed woman who presents for followup consultation of her advanced memory loss without behavioral changes. She is accompanied by her sister, [name], and her niece today. I last saw her on 06/01/2016 at which time her sister reported a couple of falls, thankfully no injuries, mostly toppled out of her chair or recliner. Patient continued to have a good appetite and her caretaker was trying to first oral fluid intake. Her MMSE was 15/30, CDT: 1.5/4, AFT: 5/min. I suggested we continue with Aricept 5 mg generic and Namenda long-acting 28 mg once daily.  Today, 11/30/2016: sister says, she had an episode of collapse while straining on the toilet, but did not fall, EMS was called, but she was not taken to ER. She was seen by her pulmonologist, then cardiologist and has been referred to palliative care. They have had one meeting thus far.    Previously:    I saw her on 12/02/2015 at which time her sister reported that things were stable. Sadly, another sister had died on 11-14-15 (had been at [facility]). They were total of 8 siblings. The patient had no recent behavioral issues. She had a bout of shingles but not full breakout. She was staying in her wheelchair for the most part. She was able to tolerate Aricept generic 5 mg daily and Namenda long-acting 28 mg daily, MMSE was 17/30, CDT: 1.5/4, AFT: 5/min. I suggested we continue with her medication regimen.    I saw her on 06/04/2015, at which time her sister and caretaker felt that she was stable. She was more deconditioned overall and had gotten weaker however. She was not walking a whole lot.some of her other caretakers were able to walker with her walker son. She was using a 2 wheeled walker at home. She had a full-time caretaker nearly [place]. Between 2 PM and 5 PM the patient and her sister are by themselves. The  overnight staff comes at 5 PM and stays through 8 AM the next day and this is through private caretakers. Her appetite was good. She was offered fluids throughout the day. They were also utilizing Pedialyte. There was no recent urinary tract infection and thankfully she had not fallen recently. Sometimes she would get irritable but no significant behavioral problems were noted. She is usually very mellow and cooperative. Her MMSE was 13/30, CDT: 0/4, AFT: 0/min. I suggested we continue with Aricept generic 5 mg once daily and Namenda long-acting 28 mg once daily.   I saw her on 12/04/2014, at which time her family reported that she had been stable. She had a recent UTI for which she was treated. She had an episode of unresponsiveness for which they had to call EMS. Her blood sugar and blood pressure were fine. There was no report of seizure activity. She came back to baseline and was not taken to the emergency room. At the last visit we mutually agreed to continue with her Namenda long-acting and low-dose Aricept. Her MMSE was 10.    I saw her on 06/03/2014, at which time her family reported that she had a UTI in March 2015 and in January 2015 she was taken to the ER via EMS secondary to altered mental status and she was found to have a low blood sugar as well as a urinary tract infection. Her 40 year old brother died in  December 2014. I suggested we continue with Aricept 5 mg and long-acting Namenda 28 mg daily.       I saw her on 11/28/2013, at which time they reported that 5 out of a total 8 total siblings have/had AD, some with behavioral issues. I kept her on Namenda XR 20 mg daily and Aricept 5 mg daily. She was not able to tolerate Aricept at 10 mg daily, because of syncope and history of bradycardia.    She previously saw Dr. Erling Cruz and was last seen by him on 09/21/2012 at which time he felt that she had improved on Namenda. He decreased her Aricept because of a history of syncope.   She lives with  her two sisters and was first seen by Dr. Erling Cruz in 2009 with memory loss beginning in 2008 or before. She has short-term episodic memory loss beginning with conversations and in taking her medications She also has procedural memory loss, not knowing how to use the TV remote. 2 other sisters and her brother have dementia. Initial MMSE was 27/30, CDT 4/4, then on 05/06/2009 MMSE 28/30, CDT 4/4, AFT 8. On 09/23/2009 = MMSE 28/30, CDT 4/4. On 10/14/10 = MMSE 26/30, CDT 4/4, AFT 12. Examination was remarkable for severe kyphosis and peripheral neuropathy. Workup for treatable causes of memory loss was negative including MRI study of the brain, TSH, B12, sedimentation rate, folate, and CBC. She is sleeping well. She no longer dresses herself, or takes care of her toilet needs. She feeds herself. They have a CNA 24/7. She has been accepted for long-term care insurance benefits. She denies depression, and there is no report of halluciation, delusions, outbursts or agitation. She does not exercise. She reads and enjoys television but is reading less than she used to. She does not cook or clean. She makes her bed. She attends church women activities and senior citizens group activities and a "friend's and Therapist, art. She has bladder incontinence but no bowel incontinence. She uses depends. She does not fall, but her gait is very unsteady. Falls assessment tool score is 17. She does not have bizarre dreams. On 10/19/11 = MMSE 26/30, CDT 4/4, AFT 8, Geriatric depression scale was 2/15.   04/12/2012 = MMSE 15/30, CDT 3/4, AFT 6. The patient passed out on 8/21/213 at the breakfast table without tonic-clonic activity. She was seen at [facility] and thought to be dehydrated with elevated WBC and urinary tract infection. She passed out again on 09/16/2012 at church in the sitting position without tonic-clonic activity or urinary incontinence.She had no warning that she was going to pass out with either incident. On  09/21/2012 = MMSE 22/30, CDT 4/4, AFT 7.   Her Past Medical History Is Significant For: Past Medical History:  Diagnosis Date  . Abnormal CT of the chest 10/08   5 mm nodule- needs f/u CT in 1 year  . Alzheimer disease    "severe" (08/08/2012)  . Depression    type 2  . DM type 2 (diabetes mellitus, type 2) ([place])   . Dysrhythmia    "irregular at times"  . GERD (gastroesophageal reflux disease)   . Hyperlipidemia   . Memory loss, short term 3/09   mild cognitive impairment  . Osteopenia   . Squamous cell carcinoma of leg     Her Past Surgical History Is Significant For: Past Surgical History:  Procedure Laterality Date  . BLADDER SUSPENSION  02/2001   pelvic vaginal sling for incontinence  . CATARACT EXTRACTION,  BILATERAL  2010  . Dexa  11/01   osteopenia  . ischemic colitis  10/98   colonoscopy  . PELVIC FRACTURE SURGERY  07/2002   after a fall  . SQUAMOUS CELL CARCINOMA EXCISION  10/2001   on leg  . TOTAL HIP ARTHROPLASTY  ? first OR; 02/2005   right X 2  . VAGINAL HYSTERECTOMY  1972    Her Family History Is Significant For: Family History  Problem Relation Age of Onset  . Stroke Mother   . Dementia Mother   . Stroke Father   . Breast cancer Sister   . Prostate cancer Brother   . Breast cancer Sister   . Memory loss Sister   . Breast cancer Other     neice  . Alzheimer's disease Sister     older sister  . Memory loss Sister     mild    Her Social History Is Significant For: Social History   Social History  . Marital status: Single    Spouse name: N/A  . Number of children: 0  . Years of education: college   Occupational History  . Retired Education officer, museum    Social History Main Topics  . Smoking status: Never Smoker  . Smokeless tobacco: Never Used  . Alcohol use No  . Drug use: No  . Sexual activity: Not Asked   Other Topics Concern  . None   Social History Narrative   Lives with her sister Jakalyn Renna) who helps care for her (and another  sister).   Involved in church activities.    Her Allergies Are:  Allergies  Allergen Reactions  . Alendronate Sodium     REACTION: GI; "don't really remember reaction"  . Cephalexin     Fainting?  . Risedronate Sodium     REACTION: GI; "don't really remember reaction"  . Valsartan-Hydrochlorothiazide   :   Her Current Medications Are:  Outpatient Encounter Prescriptions as of 11/30/2016  Medication Sig  . aspirin 81 MG tablet Take 81 mg by mouth daily.  Marland Kitchen atorvastatin (LIPITOR) 10 MG tablet TAKE ONE TABLET BY MOUTH EVERY NIGHT AT BEDTIME  . calcitonin, salmon, (MIACALCIN/FORTICAL) 200 UNIT/ACT nasal spray USE ONE SPRAY IN EACH NOSTRIL DAILY *ALTERNATING NOSTRILS  . donepezil (ARICEPT) 5 MG tablet Take 1 tablet (5 mg total) by mouth at bedtime.  . furosemide (LASIX) 20 MG tablet Take 1 tablet (20 mg total) by mouth daily.  Marland Kitchen glucose blood test strip Use to check blood sugar daily and as directed. Dx 250.00  . loratadine (CLARITIN) 10 MG tablet Take 10 mg by mouth daily.  . memantine (NAMENDA XR) 28 MG CP24 24 hr capsule Take 1 capsule (28 mg total) by mouth daily.  . metFORMIN (GLUCOPHAGE) 500 MG tablet TAKE 1 TABLET BY MOUTH TWICE A DAY WITH MEALS  . pantoprazole (PROTONIX) 40 MG tablet TAKE ONE TABLET BY MOUTH EACH MORNING   No facility-administered encounter medications on file as of 11/30/2016.   :  Review of Systems:  Out of a complete 14 point review of systems, all are reviewed and negative with the exception of these symptoms as listed below: Review of Systems  Neurological:       Pt presents today to follow up on her memory. Pt had a syncopal episode while she was on the toilet last week that the doctor at her facility attributed to straining.    Objective:  Neurologic Exam  Physical Exam Physical Examination:   Vitals:  11/30/16 1137  BP: 114/62  Pulse: (!) 103   General Examination: The patient is a very pleasant 82 y.o. female in no acute distress. She  is in her WC. She is minimally verbal. She appears alert.   HEENT: Normocephalic, atraumatic, pupils are equal, round and reactive to light Extraocular tracking is fair without nystagmus noted. Hearing is grossly intact. Face is symmetric with normal facial animation and normal facial sensation. Speech is scant, not spontaneous, but clear with no dysarthria noted. There is mild hypophonia. There is no lip, neck or jaw tremor. Neck is supple with full range of motion. Oropharynx exam reveals mild dryness. No significant airway crowding is noted. Mallampati is class II. Tongue protrudes centrally and palate elevates symmetrically.    Chest: is clear to auscultation without wheezing, rhonchi or crackles noted.  Heart: sounds are for the most part regular.   Abdomen: is soft, non-tender and non-distended with normal bowel sounds appreciated on auscultation.  Extremities: There is no pitting edema.  Skin: is warm and dry with no trophic changes noted.   Musculoskeletal: exam reveals no obvious joint deformities, tenderness or joint swelling or erythema.  Neurologically:  Mental status: The patient is awake, but does pay little attention. She is calm and cooperative with the exam. She is oriented to self. Her memory, recall, attention, language and knowledge are significantly impaired. There is slowness in thinking. Speech is scarce and low volume, but clear. She speaks in 1 to 2 word sentences.   On 06/03/2014: Her MMSE is 5/30, clock drawing is 0/4, animal fluency is 2/minute.   On 12/04/2014: MMSE: 10/30, CDT: 1/4, AFT: 5/min. GDS is 3/15.  On 06/04/2015: MMSE: 13/30, CDT: 0/4, AFT: 0/min.   On 12/02/2015: MMSE: 17/30, CDT: 1.5/4, AFT: 5/min.   On 06/01/2016: MMSE: 15/30, CDT: 1.5/4, AFT: 5/min.  On 11/30/2016: MMSE: 9/30, CDT: 0/4, AFT: 3/min.   Affect is normal. Cranial nerves are as described above under HEENT exam. In addition, shoulder shrug is normal with equal shoulder height  noted. Motor exam: thin bulk,  mildly reduced global strength is noted, tone seems normal. There is no drift, tremor or rebound. Romberg is not testable. Reflexes are 1+ throughout. Fine motor skills are mildly impaired globally.   Cerebellar testing shows no dysmetria or intention tremor. There is no truncal or gait ataxia.  Sensory exam is intact to light touch throughout.   Gait, station and balance: I did not have her stand or walk for me today as she did not bring her walker. It is not safe to stand and walk her.   Assessment and Plan:   In summary, ILZE GERDEMAN is a very pleasant 89ear old female with a history of advanced Alzheimers disease without behavioral disturbance. Her MMSE scores have been Been fairly stable in the beginning but she has had decline of her numbers and more so in the past 2 visits. She has been able to tolerate Aricept at low dose and Namenda long-acting at the maximum dose. She is now in palliative care. Her niece is asking whether or not she should continue with her memory medication. This is a difficult question to answer, as long as she has no side effects one could argue that she could continue with her medications. Nevertheless, there has been decline in her memory function with time despite being on 2 medications for memory. One could also argue that she could be taken off of any additional medications that are not immediately life-sustaining. She  is in a very caring environment. She has a caregiver as well that helps her with nutrition and water intake. I did ask him to make sure that she is not constipated and be proactive about it. I reminded her sister and her caretaker to push for oral fluid intake and gentle reminding of food intake. She has a strong family history of memory loss in several of her siblings. They were 8 of them, now only Jeaneane and her sister [name], last sister died in 2015/11/28. We mutually agreed to let her palliative care doctor taker for  her medical care and also to side or recommend about her memory medications. We mutually agreed to an as needed follow-up with me. I answered all their questions today and they were in agreement.  I spent 20 minutes in total face-to-face time with the patient, more than 50% of which was spent in counseling and coordination of care, reviewing test results, reviewing medication and discussing or reviewing the diagnosis of dementia, its prognosis and treatment options.

## 2016-12-01 ENCOUNTER — Ambulatory Visit: Payer: Medicare Other | Admitting: Neurology

## 2016-12-02 ENCOUNTER — Telehealth: Payer: Self-pay

## 2016-12-02 NOTE — Telephone Encounter (Signed)
Yes please - Hospice would be fine - I can attend , standing orders are ok

## 2016-12-02 NOTE — Telephone Encounter (Signed)
[name] with [facility] left v/m; pt was evaluated to be acceptable to be taken care of by Hospice, request cb that Dr Glori Bickers will be attending for pt with Hospice and does Dr Glori Bickers want standing orders activated.

## 2016-12-02 NOTE — Telephone Encounter (Signed)
Yvette notified of Dr. Marliss Coots comments

## 2017-01-11 ENCOUNTER — Other Ambulatory Visit: Payer: Medicare Other

## 2017-01-17 ENCOUNTER — Ambulatory Visit: Payer: Medicare Other | Admitting: Family Medicine

## 2017-01-31 ENCOUNTER — Encounter: Payer: Self-pay | Admitting: Podiatry

## 2017-01-31 ENCOUNTER — Ambulatory Visit (INDEPENDENT_AMBULATORY_CARE_PROVIDER_SITE_OTHER): Admitting: Podiatry

## 2017-01-31 DIAGNOSIS — E1151 Type 2 diabetes mellitus with diabetic peripheral angiopathy without gangrene: Secondary | ICD-10-CM

## 2017-01-31 DIAGNOSIS — B351 Tinea unguium: Secondary | ICD-10-CM

## 2017-01-31 DIAGNOSIS — M79675 Pain in left toe(s): Secondary | ICD-10-CM

## 2017-01-31 DIAGNOSIS — M79674 Pain in right toe(s): Secondary | ICD-10-CM

## 2017-01-31 NOTE — Patient Instructions (Signed)

## 2017-01-31 NOTE — Progress Notes (Signed)
Patient ID: [name], female   DOB: [birth year], 83 y.o.   MRN: [id number]    This patient presents for a scheduled visit with a complaint of thickened elongated toenails which are comfortable with direct shoe pressure and walking. Patient's caregiver and sister are present to treatment room Patient in a wheelchair and not able to transfer to treatment chair  Patient has difficulty responding directly to questioning Vascular: DP pulses 1/4 bilaterally PT pulses nonpalpable bilaterally Capillary reflex immediate bilaterally  Neurological: Ankle reflex weakly reactive bilaterally Patient not able to respond to 10 g monofilament wire vibratory sensation  Dermatological: Atrophic skin without any open wounds bilaterally The toenails are elongated, incurvated, deformed, hypertrophic and tender to direct palpation 6-10  Musculoskeletal: HAV deformity left Mallet toe second and third right  Assessment: Diabetic peripheral arterial disease Diabetic with decreased pedal pulses Symptomatic onychomycoses 6-10  Plan: Debridement of toenails 6-10 mechanically an electrical without any bleeding  Reappoint 4 months

## 2017-02-10 ENCOUNTER — Ambulatory Visit: Payer: Medicare Other | Admitting: Cardiovascular Disease

## 2017-03-14 ENCOUNTER — Telehealth: Payer: Self-pay | Admitting: Family Medicine

## 2017-03-14 NOTE — Telephone Encounter (Signed)
Patient passed away this morning at 11:25 . The family was going to [facility] to notify Sharyn Lull of patient's passing.  Sharyn Lull will be going to [facility] for rehab this afternoon.

## 2017-03-19 DEATH — deceased

## 2017-05-30 ENCOUNTER — Ambulatory Visit: Payer: Medicare Other | Admitting: Podiatry

## 2018-03-21 IMAGING — CT CT CHEST W/O CM
2 of 3 series · 15 of 36 positions shown, 18 images · non-contrast
Comparison: Chest x-ray 07/13/2016.  Chest CT 10/01/2007.

CLINICAL DATA: Cough, pleural effusion seen on chest x-ray.

EXAM:
CT CHEST WITHOUT CONTRAST
TECHNIQUE: Multidetector CT imaging of the chest was performed following the
standard protocol without IV contrast.

[Series 2: thorax · axial · 0.64mm/px · z∈[-238,-30]mm · 12 of 122 slices shown, 15 images]
[im 9/122  mediastinal]
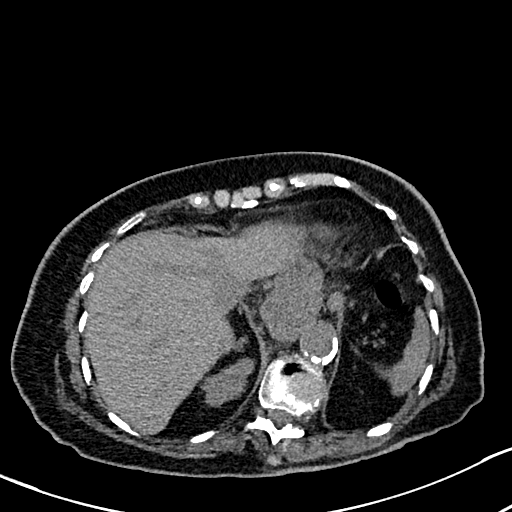
[im 9/122  lung]
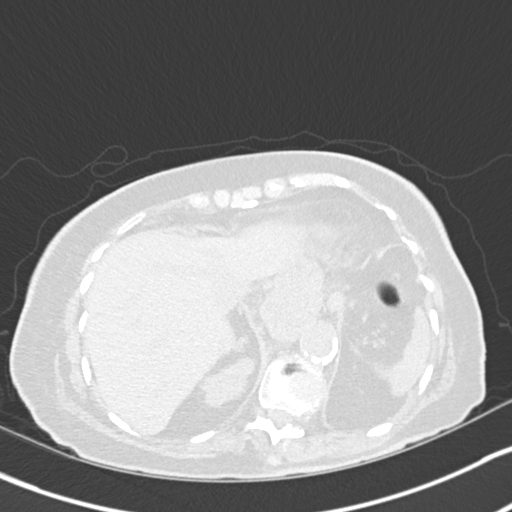
[im 18/122  lung]
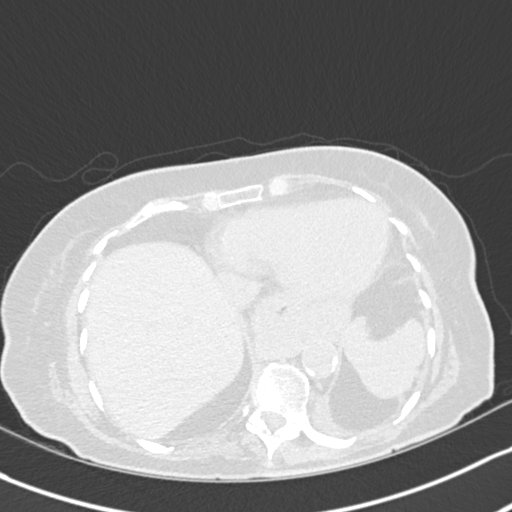
[im 27/122  lung]
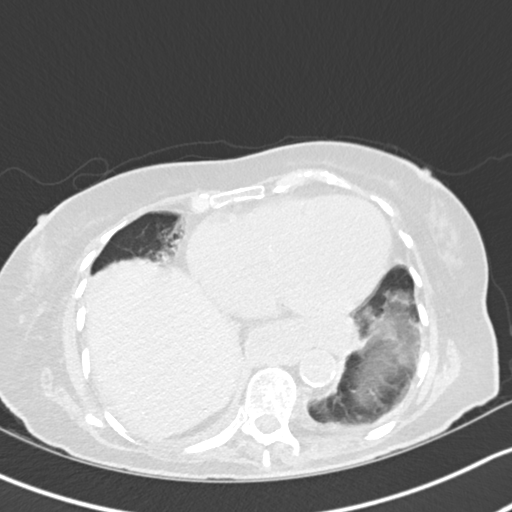
[im 36/122  lung]
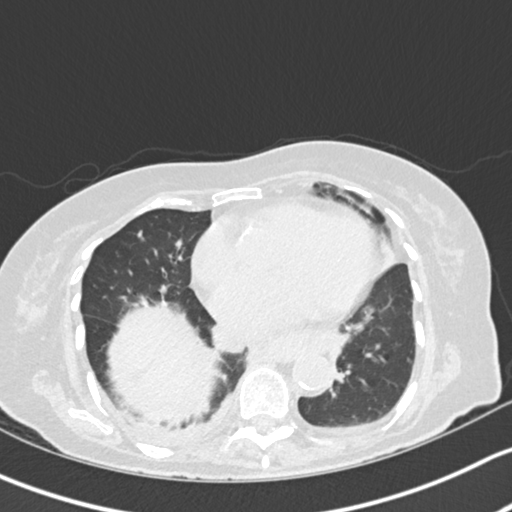
[im 45/122  mediastinal]
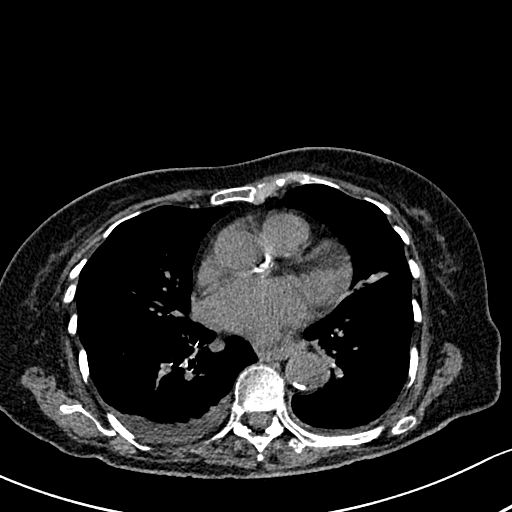
[im 45/122  lung]
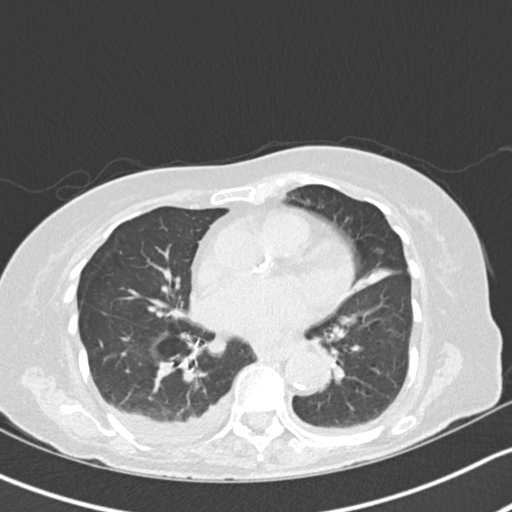
[im 54/122  lung]
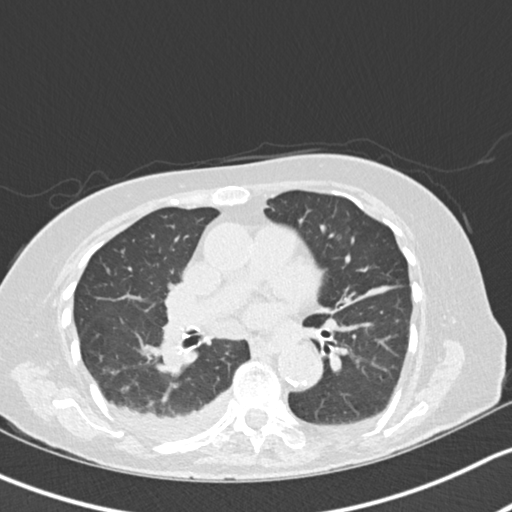
[im 68/122  lung]
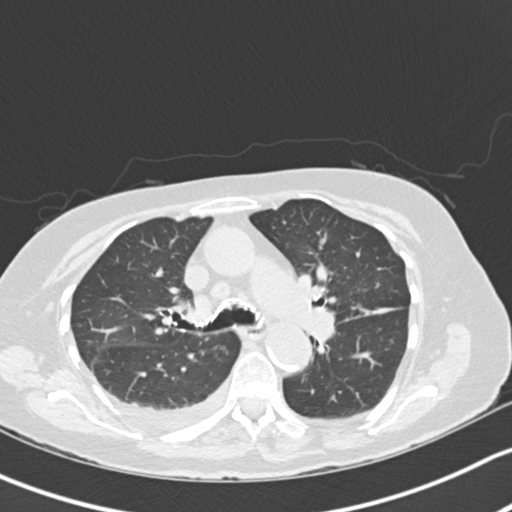
[im 77/122  lung]
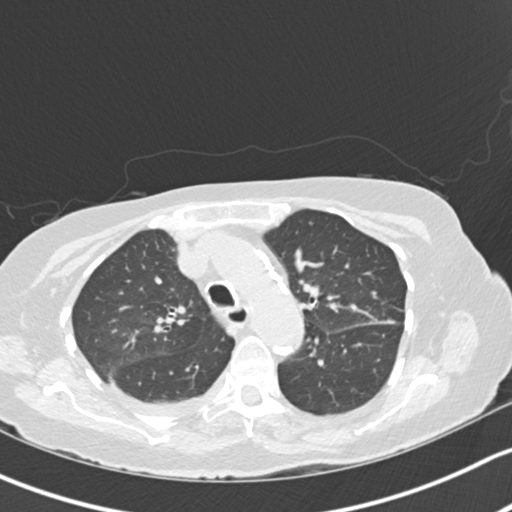
[im 86/122  mediastinal]
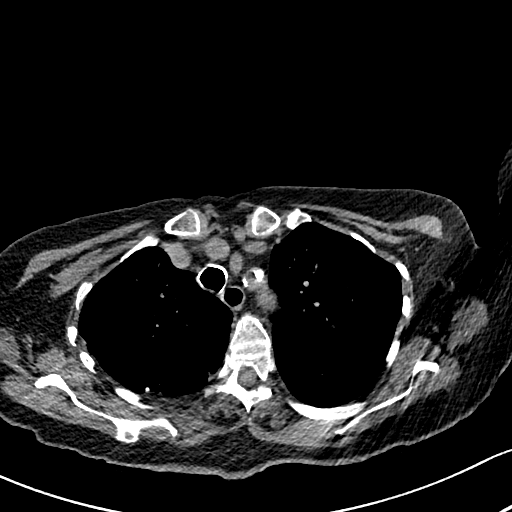
[im 86/122  lung]
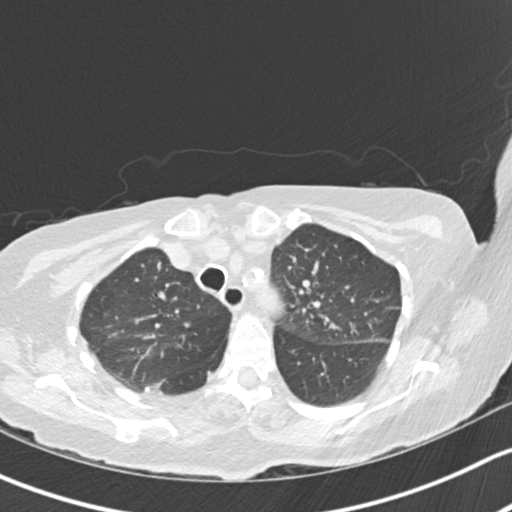
[im 95/122  lung]
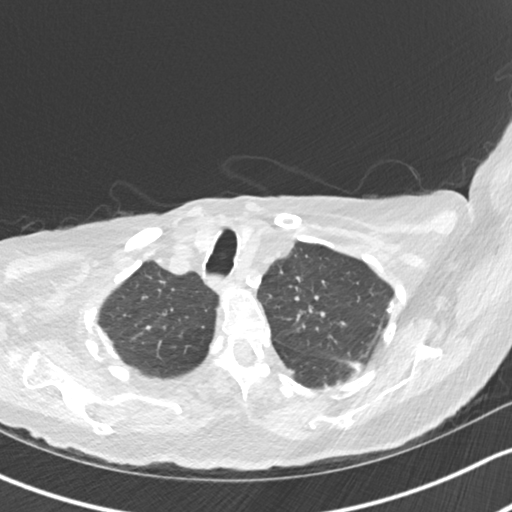
[im 104/122  lung]
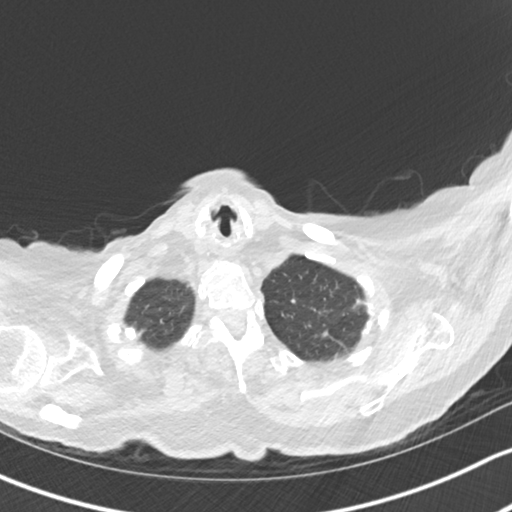
[im 113/122  lung]
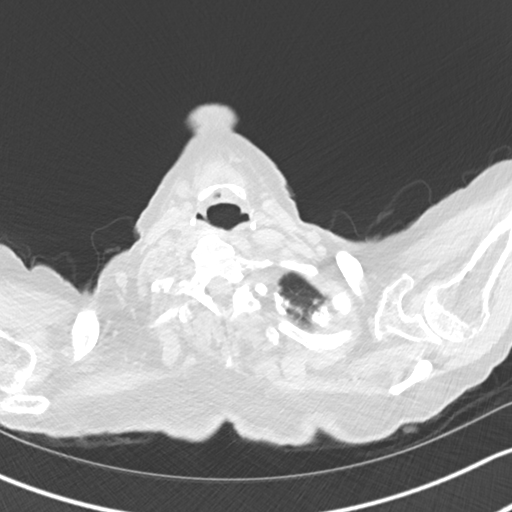

[Series 5: coronal · coronal · 0.52mm/px · 3 of 94 slices shown]
[im 19/94  lung]
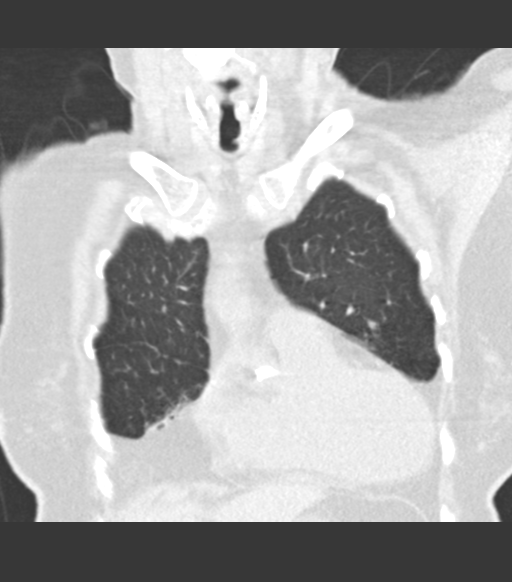
[im 38/94  lung]
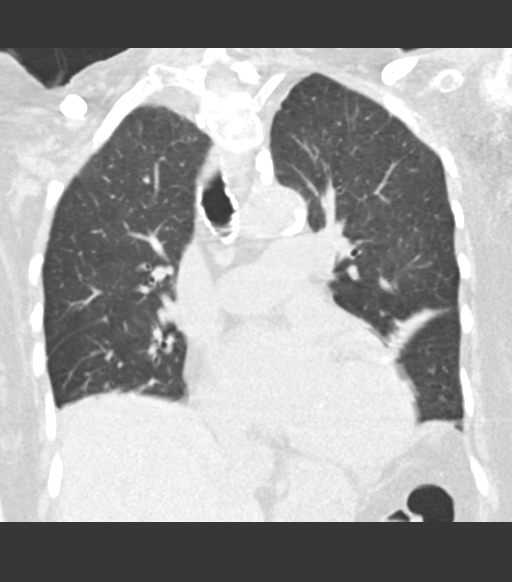
[im 56/94  lung]
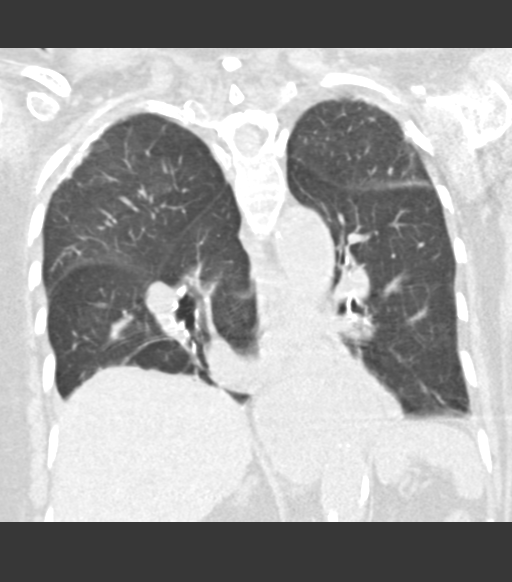

[15 of 36 positions shown; findings below may reference images not displayed]

FINDINGS: Cardiovascular: There is cardiomegaly. Diffuse aortic calcifications
without aneurysm. Calcifications at the origin of the left main
coronary artery.

Mediastinum/Nodes: Mildly prominent subcarinal lymph node, 12 mm in
short axis diameter. No other mediastinal adenopathy. No visible
hilar adenopathy. No axillary adenopathy. Moderate-sized hiatal
hernia.

Lungs/Pleura: Small bilateral effusions, right greater than left.
Scarring in the lingula and right middle lobe. Right basilar
scarring. No confluent airspace opacities or suspicious pulmonary
nodules.

Upper Abdomen: Imaging into the upper abdomen shows no acute
findings.

Musculoskeletal: No acute bony abnormality or focal bone lesion.
Chest wall soft tissues are unremarkable.
IMPRESSION: Small bilateral pleural effusions, right greater than left. Right
base atelectasis. Lingular and right middle lobe scarring.

Borderline enlarged subcarinal lymph nodes, presumably reactive.

Moderate-sized hiatal hernia.

Cardiomegaly, aortic atherosclerosis.
